# Patient Record
Sex: Male | Born: 1950 | Race: White | Hispanic: No | Marital: Married | Smoking: Former smoker
Health system: Southern US, Community
[De-identification: ages and names within clinical notes are randomized; demographics above are authoritative.]

## PROBLEM LIST (undated history)

## (undated) DIAGNOSIS — F419 Anxiety disorder, unspecified: Secondary | ICD-10-CM

## (undated) DIAGNOSIS — M1990 Unspecified osteoarthritis, unspecified site: Secondary | ICD-10-CM

## (undated) DIAGNOSIS — I82409 Acute embolism and thrombosis of unspecified deep veins of unspecified lower extremity: Secondary | ICD-10-CM

## (undated) DIAGNOSIS — E669 Obesity, unspecified: Secondary | ICD-10-CM

## (undated) DIAGNOSIS — E039 Hypothyroidism, unspecified: Secondary | ICD-10-CM

## (undated) DIAGNOSIS — Z7901 Long term (current) use of anticoagulants: Secondary | ICD-10-CM

## (undated) DIAGNOSIS — N529 Male erectile dysfunction, unspecified: Secondary | ICD-10-CM

## (undated) HISTORY — DX: Unspecified osteoarthritis, unspecified site: M19.90

## (undated) HISTORY — DX: Obesity, unspecified: E66.9

## (undated) HISTORY — PX: HERNIA REPAIR: SHX51

## (undated) HISTORY — PX: OTHER SURGICAL HISTORY: SHX169

## (undated) HISTORY — DX: Anxiety disorder, unspecified: F41.9

## (undated) HISTORY — DX: Hypothyroidism, unspecified: E03.9

## (undated) HISTORY — DX: Male erectile dysfunction, unspecified: N52.9

## (undated) HISTORY — DX: Acute embolism and thrombosis of unspecified deep veins of unspecified lower extremity: I82.409

## (undated) HISTORY — DX: Long term (current) use of anticoagulants: Z79.01

---

## 2001-01-21 ENCOUNTER — Encounter: Admission: RE | Admit: 2001-01-21 | Discharge: 2001-01-21 | Payer: Self-pay | Admitting: General Surgery

## 2001-01-21 ENCOUNTER — Encounter: Payer: Self-pay | Admitting: General Surgery

## 2003-03-29 LAB — HM COLONOSCOPY

## 2004-03-26 ENCOUNTER — Ambulatory Visit: Payer: Self-pay | Admitting: Internal Medicine

## 2004-05-14 ENCOUNTER — Ambulatory Visit: Payer: Self-pay | Admitting: Internal Medicine

## 2004-05-15 ENCOUNTER — Ambulatory Visit (HOSPITAL_COMMUNITY): Admission: RE | Admit: 2004-05-15 | Discharge: 2004-05-17 | Payer: Self-pay | Admitting: Interventional Radiology

## 2004-05-19 ENCOUNTER — Ambulatory Visit: Payer: Self-pay | Admitting: Internal Medicine

## 2004-05-22 ENCOUNTER — Ambulatory Visit: Payer: Self-pay | Admitting: Internal Medicine

## 2004-05-27 ENCOUNTER — Ambulatory Visit: Payer: Self-pay | Admitting: Internal Medicine

## 2004-06-02 ENCOUNTER — Ambulatory Visit: Payer: Self-pay | Admitting: Internal Medicine

## 2004-07-01 ENCOUNTER — Ambulatory Visit: Payer: Self-pay | Admitting: Internal Medicine

## 2004-07-15 ENCOUNTER — Ambulatory Visit: Payer: Self-pay | Admitting: Internal Medicine

## 2004-08-26 ENCOUNTER — Ambulatory Visit: Payer: Self-pay | Admitting: Internal Medicine

## 2004-10-27 ENCOUNTER — Ambulatory Visit: Payer: Self-pay | Admitting: Internal Medicine

## 2004-10-28 ENCOUNTER — Ambulatory Visit: Payer: Self-pay | Admitting: Internal Medicine

## 2005-01-01 ENCOUNTER — Ambulatory Visit: Payer: Self-pay | Admitting: Internal Medicine

## 2005-02-25 ENCOUNTER — Ambulatory Visit: Payer: Self-pay | Admitting: Internal Medicine

## 2005-03-19 ENCOUNTER — Ambulatory Visit: Payer: Self-pay | Admitting: Internal Medicine

## 2005-05-06 ENCOUNTER — Ambulatory Visit: Payer: Self-pay | Admitting: Internal Medicine

## 2005-06-19 ENCOUNTER — Ambulatory Visit: Payer: Self-pay | Admitting: Internal Medicine

## 2005-08-27 ENCOUNTER — Ambulatory Visit: Payer: Self-pay | Admitting: Internal Medicine

## 2005-09-23 ENCOUNTER — Ambulatory Visit: Payer: Self-pay | Admitting: Internal Medicine

## 2005-12-23 ENCOUNTER — Ambulatory Visit: Payer: Self-pay | Admitting: Internal Medicine

## 2005-12-23 LAB — CONVERTED CEMR LAB
INR: 2 (ref 0.9–2.0)
Prothrombin Time: 18.1 s — ABNORMAL HIGH (ref 10.0–14.0)

## 2006-02-02 ENCOUNTER — Ambulatory Visit: Payer: Self-pay | Admitting: Internal Medicine

## 2006-02-02 LAB — CONVERTED CEMR LAB
INR: 1.7 (ref 0.9–2.0)
Prothrombin Time: 16.3 s — ABNORMAL HIGH (ref 10.0–14.0)

## 2006-03-26 ENCOUNTER — Ambulatory Visit: Payer: Self-pay | Admitting: Internal Medicine

## 2006-03-26 LAB — CONVERTED CEMR LAB
ALT: 25 units/L (ref 0–40)
AST: 18 units/L (ref 0–37)
Albumin: 3.8 g/dL (ref 3.5–5.2)
Alkaline Phosphatase: 60 units/L (ref 39–117)
BUN: 16 mg/dL (ref 6–23)
CO2: 26 meq/L (ref 19–32)
Calcium: 8.9 mg/dL (ref 8.4–10.5)
Chloride: 105 meq/L (ref 96–112)
Creatinine, Ser: 1 mg/dL (ref 0.4–1.5)
GFR calc Af Amer: 100 mL/min
GFR calc non Af Amer: 82 mL/min
Glucose, Bld: 96 mg/dL (ref 70–99)
Hgb A1c MFr Bld: 5.9 % (ref 4.6–6.0)
INR: 2.1 — ABNORMAL HIGH (ref 0.9–2.0)
PSA: 0.54 ng/mL (ref 0.10–4.00)
Potassium: 4.1 meq/L (ref 3.5–5.1)
Prothrombin Time: 18.4 s — ABNORMAL HIGH (ref 10.0–14.0)
Sodium: 137 meq/L (ref 135–145)
TSH: 1.13 microintl units/mL (ref 0.35–5.50)
Total Bilirubin: 0.6 mg/dL (ref 0.3–1.2)
Total Protein: 6.6 g/dL (ref 6.0–8.3)

## 2006-07-05 ENCOUNTER — Ambulatory Visit: Payer: Self-pay | Admitting: Internal Medicine

## 2006-07-05 LAB — CONVERTED CEMR LAB
INR: 1.7
Prothrombin Time: 16.3 s — ABNORMAL HIGH

## 2006-08-30 ENCOUNTER — Encounter: Payer: Self-pay | Admitting: Internal Medicine

## 2006-08-30 DIAGNOSIS — Z86718 Personal history of other venous thrombosis and embolism: Secondary | ICD-10-CM | POA: Insufficient documentation

## 2006-08-30 DIAGNOSIS — F528 Other sexual dysfunction not due to a substance or known physiological condition: Secondary | ICD-10-CM | POA: Insufficient documentation

## 2006-09-15 ENCOUNTER — Ambulatory Visit: Payer: Self-pay | Admitting: Internal Medicine

## 2006-09-15 LAB — CONVERTED CEMR LAB
INR: 2.1 — ABNORMAL HIGH (ref 0.8–1.0)
Prothrombin Time: 18 s — ABNORMAL HIGH (ref 10.9–13.3)

## 2006-10-12 ENCOUNTER — Ambulatory Visit: Payer: Self-pay | Admitting: Internal Medicine

## 2006-10-12 LAB — CONVERTED CEMR LAB
ALT: 25 units/L (ref 0–53)
AST: 23 units/L (ref 0–37)
Albumin: 4.2 g/dL (ref 3.5–5.2)
Alkaline Phosphatase: 61 units/L (ref 39–117)
BUN: 18 mg/dL (ref 6–23)
Bilirubin, Direct: 0.2 mg/dL (ref 0.0–0.3)
CO2: 26 meq/L (ref 19–32)
Calcium: 9.3 mg/dL (ref 8.4–10.5)
Chloride: 107 meq/L (ref 96–112)
Creatinine, Ser: 1.1 mg/dL (ref 0.4–1.5)
GFR calc Af Amer: 89 mL/min
GFR calc non Af Amer: 74 mL/min
Glucose, Bld: 101 mg/dL — ABNORMAL HIGH (ref 70–99)
Hgb A1c MFr Bld: 6 % (ref 4.6–6.0)
INR: 2.5 — ABNORMAL HIGH (ref 0.8–1.0)
Potassium: 4.2 meq/L (ref 3.5–5.1)
Prothrombin Time: 19.8 s — ABNORMAL HIGH (ref 10.9–13.3)
Sodium: 140 meq/L (ref 135–145)
Total Bilirubin: 0.6 mg/dL (ref 0.3–1.2)
Total Protein: 7.1 g/dL (ref 6.0–8.3)

## 2006-11-29 ENCOUNTER — Telehealth (INDEPENDENT_AMBULATORY_CARE_PROVIDER_SITE_OTHER): Payer: Self-pay | Admitting: *Deleted

## 2006-12-02 ENCOUNTER — Ambulatory Visit: Payer: Self-pay | Admitting: Internal Medicine

## 2006-12-02 DIAGNOSIS — M1990 Unspecified osteoarthritis, unspecified site: Secondary | ICD-10-CM | POA: Insufficient documentation

## 2006-12-04 LAB — CONVERTED CEMR LAB
INR: 1.1 — ABNORMAL HIGH (ref 0.8–1.0)
Prothrombin Time: 12.8 s (ref 10.9–13.3)

## 2006-12-21 ENCOUNTER — Telehealth: Payer: Self-pay | Admitting: Internal Medicine

## 2006-12-21 ENCOUNTER — Ambulatory Visit: Payer: Self-pay | Admitting: Internal Medicine

## 2006-12-21 LAB — CONVERTED CEMR LAB
INR: 2.3 — ABNORMAL HIGH
Prothrombin Time: 19.2 s — ABNORMAL HIGH

## 2007-01-03 ENCOUNTER — Ambulatory Visit: Payer: Self-pay | Admitting: Internal Medicine

## 2007-01-03 DIAGNOSIS — E669 Obesity, unspecified: Secondary | ICD-10-CM | POA: Insufficient documentation

## 2007-01-03 DIAGNOSIS — R030 Elevated blood-pressure reading, without diagnosis of hypertension: Secondary | ICD-10-CM | POA: Insufficient documentation

## 2007-03-14 ENCOUNTER — Ambulatory Visit: Payer: Self-pay | Admitting: Internal Medicine

## 2007-03-16 LAB — CONVERTED CEMR LAB
BUN: 17 mg/dL (ref 6–23)
Basophils Absolute: 0.2 10*3/uL — ABNORMAL HIGH (ref 0.0–0.1)
Basophils Relative: 2.8 % — ABNORMAL HIGH (ref 0.0–1.0)
Bilirubin Urine: NEGATIVE
CO2: 27 meq/L (ref 19–32)
Calcium: 9.1 mg/dL (ref 8.4–10.5)
Chloride: 104 meq/L (ref 96–112)
Cholesterol: 142 mg/dL (ref 0–200)
Creatinine, Ser: 1 mg/dL (ref 0.4–1.5)
Eosinophils Absolute: 0.2 10*3/uL (ref 0.0–0.6)
Eosinophils Relative: 2.1 % (ref 0.0–5.0)
GFR calc Af Amer: 99 mL/min
GFR calc non Af Amer: 82 mL/min
Glucose, Bld: 107 mg/dL — ABNORMAL HIGH (ref 70–99)
HCT: 44 % (ref 39.0–52.0)
HDL: 36.7 mg/dL — ABNORMAL LOW (ref >39.0)
Hemoglobin, Urine: NEGATIVE
Hemoglobin: 15.3 g/dL (ref 13.0–17.0)
Hgb A1c MFr Bld: 5.9 % (ref 4.6–6.0)
Ketones, ur: NEGATIVE mg/dL
LDL Cholesterol: 82 mg/dL (ref 0–99)
Leukocytes, UA: NEGATIVE
Lymphocytes Relative: 25.4 % (ref 12.0–46.0)
MCHC: 34.9 g/dL (ref 30.0–36.0)
MCV: 96.8 fL (ref 78.0–100.0)
Monocytes Absolute: 0.6 10*3/uL (ref 0.2–0.7)
Monocytes Relative: 8 % (ref 3.0–11.0)
Neutro Abs: 4.7 10*3/uL (ref 1.4–7.7)
Neutrophils Relative %: 61.7 % (ref 43.0–77.0)
Nitrite: NEGATIVE
PSA: 0.57 ng/mL (ref 0.10–4.00)
Platelets: 324 10*3/uL (ref 150–400)
Potassium: 4.1 meq/L (ref 3.5–5.1)
RBC: 4.54 M/uL (ref 4.22–5.81)
RDW: 12.1 % (ref 11.5–14.6)
Sodium: 137 meq/L (ref 135–145)
Specific Gravity, Urine: >=1.03 (ref 1.000–1.03)
TSH: 1.78 microintl units/mL (ref 0.35–5.50)
Total CHOL/HDL Ratio: 3.9
Total Protein, Urine: NEGATIVE mg/dL
Triglycerides: 117 mg/dL (ref 0–149)
Urine Glucose: NEGATIVE mg/dL
Urobilinogen, UA: 0.2 (ref 0.0–1.0)
VLDL: 23 mg/dL (ref 0–40)
WBC: 7.7 10*3/uL (ref 4.5–10.5)
pH: 5 (ref 5.0–8.0)

## 2007-07-08 ENCOUNTER — Ambulatory Visit: Payer: Self-pay | Admitting: Internal Medicine

## 2007-07-09 LAB — CONVERTED CEMR LAB
BUN: 19 mg/dL (ref 6–23)
Basophils Absolute: 0 10*3/uL (ref 0.0–0.1)
Basophils Relative: 0.1 % (ref 0.0–1.0)
Bilirubin Urine: NEGATIVE
CO2: 26 meq/L (ref 19–32)
Calcium: 9 mg/dL (ref 8.4–10.5)
Chloride: 107 meq/L (ref 96–112)
Cholesterol: 152 mg/dL (ref 0–200)
Creatinine, Ser: 1 mg/dL (ref 0.4–1.5)
Eosinophils Absolute: 0.2 10*3/uL (ref 0.0–0.7)
Eosinophils Relative: 2.2 % (ref 0.0–5.0)
GFR calc Af Amer: 99 mL/min
GFR calc non Af Amer: 82 mL/min
Glucose, Bld: 111 mg/dL — ABNORMAL HIGH (ref 70–99)
HCT: 43.2 % (ref 39.0–52.0)
HDL: 32 mg/dL — ABNORMAL LOW (ref >39.0)
Hemoglobin, Urine: NEGATIVE
Hemoglobin: 15.3 g/dL (ref 13.0–17.0)
Hgb A1c MFr Bld: 6 % (ref 4.6–6.0)
Ketones, ur: NEGATIVE mg/dL
LDL Cholesterol: 98 mg/dL (ref 0–99)
Leukocytes, UA: NEGATIVE
Lymphocytes Relative: 26.4 % (ref 12.0–46.0)
MCHC: 35.3 g/dL (ref 30.0–36.0)
MCV: 96.7 fL (ref 78.0–100.0)
Monocytes Absolute: 0.6 10*3/uL (ref 0.1–1.0)
Monocytes Relative: 8.3 % (ref 3.0–12.0)
Neutro Abs: 4.5 10*3/uL (ref 1.4–7.7)
Neutrophils Relative %: 63 % (ref 43.0–77.0)
Nitrite: NEGATIVE
PSA: 0.44 ng/mL (ref 0.10–4.00)
Platelets: 306 10*3/uL (ref 150–400)
Potassium: 4.4 meq/L (ref 3.5–5.1)
RBC: 4.47 M/uL (ref 4.22–5.81)
RDW: 12.2 % (ref 11.5–14.6)
Sodium: 138 meq/L (ref 135–145)
Specific Gravity, Urine: 1.02 (ref 1.000–1.03)
TSH: 1.33 microintl units/mL (ref 0.35–5.50)
Total CHOL/HDL Ratio: 4.8
Total Protein, Urine: NEGATIVE mg/dL
Triglycerides: 108 mg/dL (ref 0–149)
Urine Glucose: NEGATIVE mg/dL
Urobilinogen, UA: 1 (ref 0.0–1.0)
VLDL: 22 mg/dL (ref 0–40)
WBC: 7.2 10*3/uL (ref 4.5–10.5)
pH: 6 (ref 5.0–8.0)

## 2007-07-12 ENCOUNTER — Ambulatory Visit: Payer: Self-pay | Admitting: Internal Medicine

## 2007-07-12 DIAGNOSIS — H1130 Conjunctival hemorrhage, unspecified eye: Secondary | ICD-10-CM | POA: Insufficient documentation

## 2007-10-12 ENCOUNTER — Ambulatory Visit: Payer: Self-pay | Admitting: Internal Medicine

## 2007-10-12 DIAGNOSIS — R609 Edema, unspecified: Secondary | ICD-10-CM | POA: Insufficient documentation

## 2007-10-14 LAB — CONVERTED CEMR LAB
BUN: 15 mg/dL (ref 6–23)
CO2: 27 meq/L (ref 19–32)
Calcium: 8.9 mg/dL (ref 8.4–10.5)
Chloride: 108 meq/L (ref 96–112)
Creatinine, Ser: 1 mg/dL (ref 0.4–1.5)
GFR calc Af Amer: 99 mL/min
GFR calc non Af Amer: 82 mL/min
Glucose, Bld: 99 mg/dL (ref 70–99)
Hgb A1c MFr Bld: 5.9 % (ref 4.6–6.0)
INR: 2.8 — ABNORMAL HIGH (ref 0.8–1.0)
Potassium: 4.3 meq/L (ref 3.5–5.1)
Prothrombin Time: 29.3 s — ABNORMAL HIGH (ref 10.9–13.3)
Sodium: 139 meq/L (ref 135–145)

## 2008-01-24 ENCOUNTER — Ambulatory Visit: Payer: Self-pay | Admitting: Internal Medicine

## 2008-01-26 LAB — CONVERTED CEMR LAB
INR: 3 — ABNORMAL HIGH (ref 0.8–1.0)
Prothrombin Time: 30.9 s — ABNORMAL HIGH (ref 10.9–13.3)

## 2008-01-30 ENCOUNTER — Telehealth: Payer: Self-pay | Admitting: Internal Medicine

## 2008-03-14 ENCOUNTER — Encounter (INDEPENDENT_AMBULATORY_CARE_PROVIDER_SITE_OTHER): Payer: Self-pay | Admitting: *Deleted

## 2008-05-01 ENCOUNTER — Ambulatory Visit: Payer: Self-pay | Admitting: Internal Medicine

## 2008-05-01 DIAGNOSIS — R350 Frequency of micturition: Secondary | ICD-10-CM | POA: Insufficient documentation

## 2008-05-01 DIAGNOSIS — I872 Venous insufficiency (chronic) (peripheral): Secondary | ICD-10-CM | POA: Insufficient documentation

## 2008-05-02 LAB — CONVERTED CEMR LAB
BUN: 18 mg/dL (ref 6–23)
CO2: 29 meq/L (ref 19–32)
Calcium: 9 mg/dL (ref 8.4–10.5)
Chloride: 109 meq/L (ref 96–112)
Creatinine, Ser: 1.1 mg/dL (ref 0.4–1.5)
GFR calc non Af Amer: 73.14 mL/min (ref >60)
Glucose, Bld: 99 mg/dL (ref 70–99)
Hgb A1c MFr Bld: 6 % (ref 4.6–6.5)
INR: 2.1 — ABNORMAL HIGH (ref 0.8–1.0)
PSA: 0.56 ng/mL (ref 0.10–4.00)
Potassium: 4.2 meq/L (ref 3.5–5.1)
Prothrombin Time: 21.5 s — ABNORMAL HIGH (ref 10.9–13.3)
Sodium: 142 meq/L (ref 135–145)
TSH: 1.13 microintl units/mL (ref 0.35–5.50)

## 2008-07-31 ENCOUNTER — Ambulatory Visit: Payer: Self-pay | Admitting: Internal Medicine

## 2008-08-01 LAB — CONVERTED CEMR LAB
ALT: 28 units/L (ref 0–53)
AST: 22 units/L (ref 0–37)
Albumin: 3.9 g/dL (ref 3.5–5.2)
Alkaline Phosphatase: 65 units/L (ref 39–117)
BUN: 14 mg/dL (ref 6–23)
Bilirubin, Direct: 0.2 mg/dL (ref 0.0–0.3)
CO2: 25 meq/L (ref 19–32)
Calcium: 8.9 mg/dL (ref 8.4–10.5)
Chloride: 106 meq/L (ref 96–112)
Creatinine, Ser: 1 mg/dL (ref 0.4–1.5)
GFR calc non Af Amer: 81.58 mL/min (ref >60)
Glucose, Bld: 104 mg/dL — ABNORMAL HIGH (ref 70–99)
Potassium: 3.9 meq/L (ref 3.5–5.1)
Sodium: 138 meq/L (ref 135–145)
TSH: 1.46 microintl units/mL (ref 0.35–5.50)
Total Bilirubin: 0.8 mg/dL (ref 0.3–1.2)
Total Protein: 6.7 g/dL (ref 6.0–8.3)

## 2008-10-30 ENCOUNTER — Ambulatory Visit: Payer: Self-pay | Admitting: Internal Medicine

## 2008-10-30 DIAGNOSIS — Z87891 Personal history of nicotine dependence: Secondary | ICD-10-CM | POA: Insufficient documentation

## 2008-10-30 LAB — CONVERTED CEMR LAB
BUN: 17 mg/dL (ref 6–23)
CO2: 25 meq/L (ref 19–32)
Calcium: 8.8 mg/dL (ref 8.4–10.5)
Chloride: 104 meq/L (ref 96–112)
Creatinine, Ser: 1 mg/dL (ref 0.4–1.5)
GFR calc non Af Amer: 81.5 mL/min (ref >60)
Glucose, Bld: 92 mg/dL (ref 70–99)
Potassium: 3.9 meq/L (ref 3.5–5.1)
Sodium: 139 meq/L (ref 135–145)

## 2009-01-19 DIAGNOSIS — E039 Hypothyroidism, unspecified: Secondary | ICD-10-CM

## 2009-01-19 HISTORY — DX: Hypothyroidism, unspecified: E03.9

## 2009-02-04 ENCOUNTER — Ambulatory Visit: Payer: Self-pay | Admitting: Internal Medicine

## 2009-02-04 DIAGNOSIS — R142 Eructation: Secondary | ICD-10-CM

## 2009-02-04 DIAGNOSIS — R143 Flatulence: Secondary | ICD-10-CM

## 2009-02-04 DIAGNOSIS — R141 Gas pain: Secondary | ICD-10-CM | POA: Insufficient documentation

## 2009-02-05 LAB — CONVERTED CEMR LAB
BUN: 15 mg/dL (ref 6–23)
Basophils Absolute: 0 10*3/uL (ref 0.0–0.1)
Basophils Relative: 0.5 % (ref 0.0–3.0)
CO2: 23 meq/L (ref 19–32)
Calcium: 8.8 mg/dL (ref 8.4–10.5)
Chloride: 108 meq/L (ref 96–112)
Creatinine, Ser: 0.9 mg/dL (ref 0.4–1.5)
Eosinophils Absolute: 0.1 10*3/uL (ref 0.0–0.7)
Eosinophils Relative: 2 % (ref 0.0–5.0)
GFR calc non Af Amer: 91.96 mL/min (ref >60)
Glucose, Bld: 93 mg/dL (ref 70–99)
HCT: 43.3 % (ref 39.0–52.0)
Hemoglobin: 14.7 g/dL (ref 13.0–17.0)
INR: 1.7 — ABNORMAL HIGH (ref 0.8–1.0)
Lymphocytes Relative: 30.9 % (ref 12.0–46.0)
Lymphs Abs: 2.1 10*3/uL (ref 0.7–4.0)
MCHC: 33.9 g/dL (ref 30.0–36.0)
MCV: 99.6 fL (ref 78.0–100.0)
Monocytes Absolute: 0.6 10*3/uL (ref 0.1–1.0)
Monocytes Relative: 8.7 % (ref 3.0–12.0)
Neutro Abs: 4.1 10*3/uL (ref 1.4–7.7)
Neutrophils Relative %: 57.9 % (ref 43.0–77.0)
Platelets: 282 10*3/uL (ref 150.0–400.0)
Potassium: 4.1 meq/L (ref 3.5–5.1)
Prothrombin Time: 17.2 s — ABNORMAL HIGH (ref 9.1–11.7)
RBC: 4.35 M/uL (ref 4.22–5.81)
RDW: 12.2 % (ref 11.5–14.6)
Sodium: 139 meq/L (ref 135–145)
WBC: 6.9 10*3/uL (ref 4.5–10.5)

## 2009-05-07 ENCOUNTER — Ambulatory Visit: Payer: Self-pay | Admitting: Internal Medicine

## 2009-05-07 LAB — CONVERTED CEMR LAB
BUN: 15 mg/dL (ref 6–23)
CO2: 27 meq/L (ref 19–32)
Calcium: 8.8 mg/dL (ref 8.4–10.5)
Chloride: 105 meq/L (ref 96–112)
Creatinine, Ser: 1.1 mg/dL (ref 0.4–1.5)
GFR calc non Af Amer: 72.88 mL/min (ref >60)
Glucose, Bld: 95 mg/dL (ref 70–99)
INR: 1.7 — ABNORMAL HIGH (ref 0.8–1.0)
Potassium: 4.3 meq/L (ref 3.5–5.1)
Prothrombin Time: 17.4 s — ABNORMAL HIGH (ref 9.1–11.7)
Sodium: 138 meq/L (ref 135–145)

## 2009-08-02 ENCOUNTER — Telehealth: Payer: Self-pay | Admitting: Gastroenterology

## 2009-08-07 ENCOUNTER — Ambulatory Visit: Payer: Self-pay | Admitting: Internal Medicine

## 2009-08-07 DIAGNOSIS — M25569 Pain in unspecified knee: Secondary | ICD-10-CM | POA: Insufficient documentation

## 2009-08-08 LAB — CONVERTED CEMR LAB
INR: 1.9 — ABNORMAL HIGH (ref 0.8–1.0)
Prothrombin Time: 20.3 s — ABNORMAL HIGH (ref 9.7–11.8)

## 2009-11-12 ENCOUNTER — Ambulatory Visit: Payer: Self-pay | Admitting: Internal Medicine

## 2009-11-12 ENCOUNTER — Encounter: Payer: Self-pay | Admitting: Internal Medicine

## 2009-11-12 DIAGNOSIS — E039 Hypothyroidism, unspecified: Secondary | ICD-10-CM | POA: Insufficient documentation

## 2009-11-13 LAB — CONVERTED CEMR LAB
ALT: 18 units/L (ref 0–53)
AST: 19 units/L (ref 0–37)
Albumin: 4 g/dL (ref 3.5–5.2)
Alkaline Phosphatase: 63 units/L (ref 39–117)
BUN: 18 mg/dL (ref 6–23)
Basophils Absolute: 0 10*3/uL (ref 0.0–0.1)
Basophils Relative: 0.4 % (ref 0.0–3.0)
Bilirubin Urine: NEGATIVE
Bilirubin, Direct: 0.2 mg/dL (ref 0.0–0.3)
CO2: 25 meq/L (ref 19–32)
Calcium: 9 mg/dL (ref 8.4–10.5)
Chloride: 107 meq/L (ref 96–112)
Cholesterol: 140 mg/dL (ref 0–200)
Creatinine, Ser: 1 mg/dL (ref 0.4–1.5)
Eosinophils Absolute: 0.2 10*3/uL (ref 0.0–0.7)
Eosinophils Relative: 1.6 % (ref 0.0–5.0)
GFR calc non Af Amer: 83.13 mL/min (ref >60)
Glucose, Bld: 96 mg/dL (ref 70–99)
HCT: 42.8 % (ref 39.0–52.0)
HDL: 41.1 mg/dL (ref >39.00)
Hemoglobin, Urine: NEGATIVE
Hemoglobin: 14.8 g/dL (ref 13.0–17.0)
Ketones, ur: NEGATIVE mg/dL
LDL Cholesterol: 79 mg/dL (ref 0–99)
Leukocytes, UA: NEGATIVE
Lymphocytes Relative: 19.2 % (ref 12.0–46.0)
Lymphs Abs: 2 10*3/uL (ref 0.7–4.0)
MCHC: 34.6 g/dL (ref 30.0–36.0)
MCV: 98.5 fL (ref 78.0–100.0)
Monocytes Absolute: 0.6 10*3/uL (ref 0.1–1.0)
Monocytes Relative: 5.8 % (ref 3.0–12.0)
Neutro Abs: 7.5 10*3/uL (ref 1.4–7.7)
Neutrophils Relative %: 73 % (ref 43.0–77.0)
Nitrite: NEGATIVE
PSA: 1.78 ng/mL (ref 0.10–4.00)
Platelets: 339 10*3/uL (ref 150.0–400.0)
Potassium: 4.2 meq/L (ref 3.5–5.1)
RBC: 4.34 M/uL (ref 4.22–5.81)
RDW: 12.9 % (ref 11.5–14.6)
Sodium: 139 meq/L (ref 135–145)
Specific Gravity, Urine: >=1.03 (ref 1.000–1.030)
TSH: 9.13 microintl units/mL — ABNORMAL HIGH (ref 0.35–5.50)
Total Bilirubin: 0.7 mg/dL (ref 0.3–1.2)
Total CHOL/HDL Ratio: 3
Total Protein, Urine: NEGATIVE mg/dL
Total Protein: 6.6 g/dL (ref 6.0–8.3)
Triglycerides: 100 mg/dL (ref 0.0–149.0)
Urine Glucose: NEGATIVE mg/dL
Urobilinogen, UA: 0.2 (ref 0.0–1.0)
VLDL: 20 mg/dL (ref 0.0–40.0)
WBC: 10.3 10*3/uL (ref 4.5–10.5)
pH: 5.5 (ref 5.0–8.0)

## 2009-11-14 ENCOUNTER — Ambulatory Visit: Payer: Self-pay | Admitting: Internal Medicine

## 2009-11-15 LAB — CONVERTED CEMR LAB
INR: 1.6 — ABNORMAL HIGH (ref 0.8–1.0)
Prothrombin Time: 17.5 s — ABNORMAL HIGH (ref 9.7–11.8)

## 2010-02-14 ENCOUNTER — Other Ambulatory Visit: Payer: Self-pay | Admitting: Internal Medicine

## 2010-02-14 ENCOUNTER — Ambulatory Visit
Admission: RE | Admit: 2010-02-14 | Discharge: 2010-02-14 | Payer: Self-pay | Source: Home / Self Care | Attending: Internal Medicine | Admitting: Internal Medicine

## 2010-02-14 ENCOUNTER — Ambulatory Visit: Admit: 2010-02-14 | Payer: Self-pay | Admitting: Internal Medicine

## 2010-02-14 LAB — PROTIME-INR
INR: 2.3 ratio — ABNORMAL HIGH (ref 0.8–1.0)
Prothrombin Time: 23.5 s — ABNORMAL HIGH (ref 10.2–12.4)

## 2010-02-18 ENCOUNTER — Ambulatory Visit
Admission: RE | Admit: 2010-02-18 | Discharge: 2010-02-18 | Payer: Self-pay | Source: Home / Self Care | Attending: Internal Medicine | Admitting: Internal Medicine

## 2010-02-18 DIAGNOSIS — H6690 Otitis media, unspecified, unspecified ear: Secondary | ICD-10-CM | POA: Insufficient documentation

## 2010-02-18 DIAGNOSIS — F419 Anxiety disorder, unspecified: Secondary | ICD-10-CM | POA: Insufficient documentation

## 2010-02-18 DIAGNOSIS — F411 Generalized anxiety disorder: Secondary | ICD-10-CM | POA: Insufficient documentation

## 2010-02-18 NOTE — Letter (Signed)
Summary: *Consult Note  [facility]  [address] [place], [place] [address]   Phone: [phone]  Fax: [phone]    Re:    Joseph [name] DOB:    07/07/1950   Dear: Dr Darl Pikes   Thank you for requesting that we see the above patient for consultation.  A copy of the detailed office note will be sent under separate cover, for your review.  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 If the INR is less than 2.5, he should be OK to have oral surgery without stopping his Coumadin (if it is OK with you)    Thank you for this consultation.  If you have any further questions regarding the care of this patient, please do not hesitate to contact me @   Thank you for this opportunity to look after your patient.  Sincerely,   Tresa Garter MD

## 2010-02-18 NOTE — Assessment & Plan Note (Signed)
Summary: 3 mos well /bcbs / #/cd   Vital Signs:  Patient profile:   60 year old male Height:      75 inches Weight:      284 pounds BMI:     35.63 Temp:     98.8 degrees F oral Pulse rate:   72 / minute Pulse rhythm:   regular Resp:     16 per minute BP sitting:   112 / 90  (left arm) Cuff size:   large  Vitals Entered By: Lanier Prude, Beverly Gust) (November 12, 2009 3:47 PM) CC: CPX   CC:  CPX.  History of Present Illness: The patient presents for a preventive health examination   Current Medications (verified): 1)  Coumadin 10 Mg  Tabs (Warfarin Sodium) .... One Once Daily Exept  1.5 Tab  On Saturday and Sunday or As Dirrected 2)  Xanax 1 Mg  Tabs (Alprazolam) .... 1/2 -1 Two Times A Day As Needed 3)  Viagra 100 Mg Tabs (Sildenafil Citrate) .Marland Kitchen.. 1 By Mouth Once Daily Prn 4)  Vitamin D3 1000 Unit  Tabs (Cholecalciferol) .Marland Kitchen.. 1 Qd 5)  Cialis 20 Mg Tabs (Tadalafil) .Marland Kitchen.. 1 Tablet Every Other Day As Needed 6)  Pennsaid 1.5 % Soln (Diclofenac Sodium) .... 3-5 Gtt On Skin Three Times A Day For Pain  Allergies (verified): 1)  Erythromycin  Past History:  Past Surgical History: Last updated: 08/30/2006 Herniorrhaphy  Family History: Last updated: 12/02/2006 Family History of CAD Male 1st degree relative <50 died at 54 M 25 alive  Social History: Last updated: 10/30/2008 Occupation: Clinical research associate Married Former Smoker Alcohol use-no Regular exercise-started to walk, doing wts, gym  Past Medical History: DVT R leg, hx of Anticoagulation therapy Osteoarthritis ED Postphlebitic swelling R leg Elev. BP 2008 Obesity Hypothyroidism 2011  Review of Systems  The patient denies anorexia, fever, weight loss, weight gain, vision loss, decreased hearing, hoarseness, chest pain, syncope, dyspnea on exertion, peripheral edema, prolonged cough, headaches, hemoptysis, abdominal pain, melena, hematochezia, severe indigestion/heartburn, hematuria, incontinence, genital sores, muscle  weakness, suspicious skin lesions, transient blindness, difficulty walking, depression, unusual weight change, abnormal bleeding, enlarged lymph nodes, angioedema, and testicular masses.         on diet, exercising, lost 8 lbs  Physical Exam  General:  overweight-appearing.   Head:  Normocephalic and atraumatic without obvious abnormalities. No apparent alopecia or balding. Eyes:  2 mm R upper and 1 mm lower eyelid cysts Ears:  External ear exam shows no significant lesions or deformities.  Otoscopic examination reveals clear canals, tympanic membranes are intact bilaterally without bulging, retraction, inflammation or discharge. Hearing is grossly normal bilaterally. Nose:  External nasal examination shows no deformity or inflammation. Nasal mucosa are pink and moist without lesions or exudates. Mouth:  Oral mucosa and oropharynx without lesions or exudates.  Teeth in good repair. Neck:  No deformities, masses, or tenderness noted. Chest Wall:  No deformities, masses, tenderness or gynecomastia noted. Lungs:  Normal respiratory effort, chest expands symmetrically. Lungs are clear to auscultation, no crackles or wheezes. Heart:  Normal rate and regular rhythm. S1 and S2 normal without gallop, murmur, click, rub or other extra sounds. Abdomen:  Bowel sounds positive,abdomen soft and non-tender without masses, organomegaly or hernias noted. Rectal:  NE - colonoscopy is pending  Genitalia:  as above Prostate:  as above Msk:  No deformity or scoliosis noted of thoracic or lumbar spine.   Pulses:  R and L carotid,radial,femoral,dorsalis pedis and posterior tibial pulses are full and  equal bilaterally Extremities:  trace left pedal edema and 1+ right pedal edema.   Neurologic:  alert & oriented X3.   Skin:  Intact without suspicious lesions or rashes Hyperpigmentation over RLE 4 mm mole in RUQ Psych:  Cognition and judgment appear intact. Alert and cooperative with normal attention span and  concentration. No apparent delusions, illusions, hallucinations   Impression & Recommendations:  Problem # 1:  HEALTH MAINTENANCE EXAM (ICD-V70.0) Assessment New The labs were reviewed - mailed to pt Health and age related issues were discussed. Available screening tests and vaccinations were discussed as well. Healthy life style including good diet and exercise was discussed. Cont w/wt loss Colon is pending   Problem # 2:  HYPOTHYROIDISM (ICD-244.9) Assessment: New  Start on Rx  His updated medication list for this problem includes:    Synthroid 25 Mcg Tabs (Levothyroxine sodium) .Marland Kitchen... 1 by mouth once daily for thyroid  Problem # 3:  EDEMA (ICD-782.3) Assessment: Unchanged  Problem # 4:  ANTICOAGULATION THERAPY (ICD-V58.61) Assessment: Unchanged  Complete Medication List: 1)  Coumadin 10 Mg Tabs (Warfarin sodium) .... One once daily exept  1.5 tab  on saturday and sunday or as dirrected 2)  Xanax 1 Mg Tabs (Alprazolam) .... 1/2 -1 two times a day as needed 3)  Viagra 100 Mg Tabs (Sildenafil citrate) .Marland Kitchen.. 1 by mouth once daily prn 4)  Vitamin D3 1000 Unit Tabs (Cholecalciferol) .Marland Kitchen.. 1 qd 5)  Cialis 20 Mg Tabs (Tadalafil) .Marland Kitchen.. 1 tablet every other day as needed 6)  Pennsaid 1.5 % Soln (Diclofenac sodium) .... 3-5 gtt on skin three times a day for pain 7)  Synthroid 25 Mcg Tabs (Levothyroxine sodium) .Marland Kitchen.. 1 by mouth once daily for thyroid  Other Orders: EKG w/ Interpretation (93000) Admin 1st Vaccine (16109) Flu Vaccine 87yrs + (60454)  Patient Instructions: 1)  Please schedule a follow-up appointment in 3 months. Prescriptions: SYNTHROID 25 MCG TABS (LEVOTHYROXINE SODIUM) 1 by mouth once daily for thyroid  #30 x 11   Entered and Authorized by:   Tresa Garter MD   Signed by:   Tresa Garter MD on 11/12/2009   Method used:   Print then Give to Patient   RxID:   [id number] COUMADIN 10 MG  TABS (WARFARIN SODIUM) one once daily exept  1.5 tab  on Saturday  and Sunday or as dirrected  #45 x 12   Entered and Authorized by:   Tresa Garter MD   Signed by:   Tresa Garter MD on 11/12/2009   Method used:   Print then Give to Patient   RxID:   [id number]    Orders Added: 1)  EKG w/ Interpretation [93000] 2)  Admin 1st Vaccine [90471] 3)  Flu Vaccine 58yrs + [41324] 4)  Est. Patient 40-64 years [40102]   Tetanus Vaccine (to be given today)   Tetanus Vaccine (to be given today)   .[email or web address]   Flu Vaccine Consent Questions     Do you have a history of severe allergic reactions to this vaccine? no    Any prior history of allergic reactions to egg and/or gelatin? no    Do you have a sensitivity to the preservative Thimersol? no    Do you have a past history of Guillan-Barre Syndrome? no    Do you currently have an acute febrile illness? no    Have you ever had a severe reaction to latex? no    Vaccine information given and explained to patient?  yes    Are you currently pregnant? no    Lot Number:[id number]   Exp Date:07/19/2010   Site Given  Right Deltoid IM Lanier Prude, [facility])  November 12, 2009 3:58 PM

## 2010-02-18 NOTE — Assessment & Plan Note (Signed)
Summary: 3 MO ROV /NWS   Vital Signs:  Patient profile:   60 year old male Weight:      285 pounds BMI:     35.75 Temp:     98.3 degrees F oral Pulse rate:   64 / minute BP sitting:   122 / 90  (left arm)  Vitals Entered By: Tora Perches (February 04, 2009 1:47 PM) CC: f/u Is Patient Diabetic? No   CC:  f/u.  History of Present Illness: The patient presents for a follow up of back pain, anxiety, ED, anticoagulation. C/o lesion on R eye x 3 months - NT  Preventive Screening-Counseling & Management  Alcohol-Tobacco     Smoking Status: quit  Allergies: 1)  Erythromycin  Past History:  Past Medical History: Last updated: 01/03/2007 DVT R leg, hx of Anticoagulation therapy Osteoarthritis ED Postphlebitic swelling R leg Elev. BP 2008 Obesity  Past Surgical History: Last updated: 08/30/2006 Herniorrhaphy  Social History: Last updated: 10/30/2008 Occupation: Clinical research associate Married Former Smoker Alcohol use-no Regular exercise-started to walk, doing wts, gym  Review of Systems  The patient denies fever, chest pain, dyspnea on exertion, and abdominal pain.    Physical Exam  General:  overweight-appearing.   Eyes:  2 mm R upper and 1 mm lower eyelid cysts Ears:  External ear exam shows no significant lesions or deformities.  Otoscopic examination reveals clear canals, tympanic membranes are intact bilaterally without bulging, retraction, inflammation or discharge. Hearing is grossly normal bilaterally. Nose:  External nasal examination shows no deformity or inflammation. Nasal mucosa are pink and moist without lesions or exudates. Mouth:  Oral mucosa and oropharynx without lesions or exudates.  Teeth in good repair. Lungs:  Normal respiratory effort, chest expands symmetrically. Lungs are clear to auscultation, no crackles or wheezes. Heart:  Normal rate and regular rhythm. S1 and S2 normal without gallop, murmur, click, rub or other extra sounds. Abdomen:  Bowel  sounds positive,abdomen soft and non-tender without masses, organomegaly or hernias noted. Msk:  No deformity or scoliosis noted of thoracic or lumbar spine.   Pulses:  R and L carotid,radial,femoral,dorsalis pedis and posterior tibial pulses are full and equal bilaterally Extremities:  trace left pedal edema and 1 to 2+ right pedal edema.   Neurologic:  alert & oriented X3.   Skin:  Intact without suspicious lesions or rashes Hyperpigmentation over RLE Psych:  Cognition and judgment appear intact. Alert and cooperative with normal attention span and concentration. No apparent delusions, illusions, hallucinations   Impression & Recommendations:  Problem # 1:  R eye upper eyelid 2 mm gland cyst Assessment New Ophth consult for removal - he will let me know Z pac if swells  Problem # 2:  VENOUS INSUFFICIENCY, CHRONIC, RIGHT LEG (ICD-459.81) Assessment: Unchanged  Orders: TLB-CBC Platelet - w/Differential (85025-CBCD) TLB-BMP (Basic Metabolic Panel-BMET) (80048-METABOL) TLB-PT (Protime) (85610-PTP)  Problem # 3:  FLATULENCE (ICD-787.3) Assessment: Deteriorated Align, diet. Other options discussed  Problem # 4:  EDEMA (ICD-782.3) R leg Assessment: Unchanged  Complete Medication List: 1)  Coumadin 10 Mg Tabs (Warfarin sodium) .... One once daily exept  1.5 tab  on saturday and sunday or as dirrected 2)  Xanax 1 Mg Tabs (Alprazolam) .... 1/2 -1 two times a day as needed 3)  Viagra 100 Mg Tabs (Sildenafil citrate) .Marland Kitchen.. 1 by mouth once daily prn 4)  Vitamin D3 1000 Unit Tabs (Cholecalciferol) .Marland Kitchen.. 1 qd 5)  Cialis 20 Mg Tabs (Tadalafil) .Marland Kitchen.. 1 tablet every other day as needed  Patient Instructions: 1)  Please schedule a follow-up appointment in 3 months. Prescriptions: XANAX 1 MG  TABS (ALPRAZOLAM) 1/2 -1 two times a day as needed  #60 x 6   Entered and Authorized by:   Tresa Garter MD   Signed by:   Tresa Garter MD on 02/04/2009   Method used:   Print then Give to  Patient   RxID:   [id number] VIAGRA 100 MG TABS (SILDENAFIL CITRATE) 1 by mouth once daily prn  #6 x 12   Entered and Authorized by:   Tresa Garter MD   Signed by:   Tresa Garter MD on 02/04/2009   Method used:   Print then Give to Patient   RxID:   [id number]

## 2010-02-18 NOTE — Assessment & Plan Note (Signed)
Summary: 3 mo rov/nws   Vital Signs:  Patient Profile:   60 Years Old Male Weight:      301 pounds Temp:     98.4 degrees F oral Pulse rate:   60 / minute BP sitting:   146 / 98  (left arm)  Vitals Entered By: Tora Perches (January 03, 2007 11:06 AM)             Is Patient Diabetic? No     Chief Complaint:  Multiple medical problems or concerns.  History of Present Illness: The patient presents for a follow up of anticoag., DVT, BPH, elev. BP   Current Allergies (reviewed today): ERYTHROMYCIN  Past Medical History:    DVT R leg, hx of    Anticoagulation therapy    Osteoarthritis    ED    Postphlebitic swelling R leg    Elev. BP 2008    Obesity   Family History:    Reviewed history from 12/02/2006 and no changes required:       Family History of CAD Male 1st degree relative <50 died at 21       M 75 alive  Social History:    Reviewed history from 12/02/2006 and no changes required:       Occupation:       Married       Former Smoker       Alcohol use-no       Regular exercise-no    Review of Systems  The patient denies fever, chest pain, and abdominal pain.     Physical Exam  General:     Well-developed,well-nourished,in no acute distress; alert,appropriate and cooperative throughout examination Eyes:     No corneal or conjunctival inflammation noted. EOMI. Perrla. Funduscopic exam benign, without hemorrhages, exudates or papilledema. Vision grossly normal. Ears:     External ear exam shows no significant lesions or deformities.  Otoscopic examination reveals clear canals, tympanic membranes are intact bilaterally without bulging, retraction, inflammation or discharge. Hearing is grossly normal bilaterally. Mouth:     Sutures present on upper R Neck:     No deformities, masses, or tenderness noted. Lungs:     Normal respiratory effort, chest expands symmetrically. Lungs are clear to auscultation, no crackles or wheezes. Heart:     Normal rate  and regular rhythm. S1 and S2 normal without gallop, murmur, click, rub or other extra sounds. Abdomen:     Bowel sounds positive,abdomen soft and non-tender without masses, organomegaly or hernias noted. Msk:     No deformity or scoliosis noted of thoracic or lumbar spine.   Extremities:     trace right pedal edema.   Neurologic:     No cranial nerve deficits noted. Station and gait are normal. Plantar reflexes are down-going bilaterally. DTRs are symmetrical throughout. Sensory, motor and coordinative functions appear intact. Skin:     Intact without suspicious lesions or rashes Psych:     Cognition and judgment appear intact. Alert and cooperative with normal attention span and concentration. No apparent delusions, illusions, hallucinations    Impression & Recommendations:  Problem # 1:  DVT, HX OF (ICD-V12.51)  His updated medication list for this problem includes:    Coumadin 10 Mg Tabs (Warfarin sodium) ..... One once daily exept  1.5 mg  on saturday and sunday or as dirrected   Problem # 2:  ELEVATED BP (ICD-796.2) Loose wt, NAS diet. Start on Rx if still elev The following medications were removed from the medication  list:    Spironolactone 50 Mg Tabs (Spironolactone) .Marland Kitchen... 1-2    Torsemide 100 Mg Tabs (Torsemide) .Marland Kitchen... As needed   Problem # 3:  ERECTILE DYSFUNCTION (ICD-302.72)  The following medications were removed from the medication list:    Viagra 50 Mg Tabs (Sildenafil citrate) .Marland Kitchen... As needed  His updated medication list for this problem includes:    Viagra 100 Mg Tabs (Sildenafil citrate) .Marland Kitchen... 1 by mouth once daily prn   Complete Medication List: 1)  Coumadin 10 Mg Tabs (Warfarin sodium) .... One once daily exept  1.5 mg  on saturday and sunday or as dirrected 2)  Xanax 1 Mg Tabs (Alprazolam) .... 1/2 -1 two times a day as needed 3)  Viagra 100 Mg Tabs (Sildenafil citrate) .Marland Kitchen.. 1 by mouth once daily prn 4)  Vitamin D3 1000 Unit Tabs (Cholecalciferol) .Marland Kitchen..  1 qd   Patient Instructions: 1)  Please schedule a follow-up appointment in 3 months. 2)  Limit your Sodium (Salt). 3)  You need to lose weight. Consider a lower calorie diet and regular exercise.     Prescriptions: XANAX 1 MG  TABS (ALPRAZOLAM) 1/2 -1 two times a day as needed  #60 x 6   Entered and Authorized by:   Tresa Garter MD   Signed by:   Tresa Garter MD on 01/03/2007   Method used:   Print then Give to Patient   RxID:   [id number] VIAGRA 100 MG TABS (SILDENAFIL CITRATE) 1 by mouth once daily prn  #6 x 12   Entered and Authorized by:   Tresa Garter MD   Signed by:   Tresa Garter MD on 01/03/2007   Method used:   Print then Give to Patient   RxID:   [id number] COUMADIN 10 MG  TABS (WARFARIN SODIUM) one once daily exept  1.5 mg  on Saturday and Sunday or as dirrected  #60 x 12   Entered and Authorized by:   Tresa Garter MD   Signed by:   Tresa Garter MD on 01/03/2007   Method used:   Print then Give to Patient   RxID:   [id number]  ]

## 2010-02-18 NOTE — Progress Notes (Signed)
Summary: PLEASE ADVISE ASAP  Phone Note From Other Clinic   Caller: Dr Darl Pikes [phone] Summary of Call: Pt has stopped his coumadin per Dr Darl Pikes. Please look at stat PT results and advise if pt is ok for surgery and if he should continue coumadin as advised.  Initial call taken by: Lamar Sprinkles,  December 21, 2006 5:33 PM  Follow-up for Phone Call        INR =2.3 OK for oral surgery Follow-up by: Tresa Garter MD,  December 21, 2006 6:13 PM  Additional Follow-up for Phone Call Additional follow up Details #1::        Dr Darl Pikes informed Additional Follow-up by: Lamar Sprinkles,  December 22, 2006 8:13 AM

## 2010-02-18 NOTE — Assessment & Plan Note (Signed)
Summary: 3 MO ROV /NWS  $50   Vital Signs:  Patient profile:   60 year old male Weight:      287 pounds Temp:     98.4 degrees F oral Pulse rate:   58 / minute BP sitting:   128 / 84  (left arm)  Vitals Entered By: Tora Perches (October 30, 2008 2:51 PM) CC: f/u Is Patient Diabetic? No   CC:  f/u.  History of Present Illness: The patient presents for a follow up of R post DVT syndrome, anxiety, ED   Preventive Screening-Counseling & Management  Alcohol-Tobacco     Smoking Status: quit  Current Medications (verified): 1)  Coumadin 10 Mg  Tabs (Warfarin Sodium) .... One Once Daily Exept  1.5 Tab  On Saturday and Sunday or As Dirrected 2)  Xanax 1 Mg  Tabs (Alprazolam) .... 1/2 -1 Two Times A Day As Needed 3)  Viagra 100 Mg Tabs (Sildenafil Citrate) .Marland Kitchen.. 1 By Mouth Once Daily Prn 4)  Vitamin D3 1000 Unit  Tabs (Cholecalciferol) .Marland Kitchen.. 1 Qd 5)  Cialis 20 Mg Tabs (Tadalafil) .Marland Kitchen.. 1 Tablet Every Other Day As Needed  Allergies: 1)  Erythromycin  Past History:  Past Medical History: Last updated: 01/03/2007 DVT R leg, hx of Anticoagulation therapy Osteoarthritis ED Postphlebitic swelling R leg Elev. BP 2008 Obesity  Social History: Occupation: Clinical research associate Married Former Smoker Alcohol use-no Regular exercise-started to walk, doing wts, gym  Review of Systems  The patient denies chest pain, hemoptysis, abdominal pain, melena, and hematochezia.    Physical Exam  General:  overweight-appearing.   Nose:  External nasal examination shows no deformity or inflammation. Nasal mucosa are pink and moist without lesions or exudates. Mouth:  Oral mucosa and oropharynx without lesions or exudates.  Teeth in good repair. Lungs:  Normal respiratory effort, chest expands symmetrically. Lungs are clear to auscultation, no crackles or wheezes. Heart:  Normal rate and regular rhythm. S1 and S2 normal without gallop, murmur, click, rub or other extra sounds. Abdomen:  Bowel sounds  positive,abdomen soft and non-tender without masses, organomegaly or hernias noted. Msk:  No deformity or scoliosis noted of thoracic or lumbar spine.   Extremities:  trace left pedal edema and 1 to 2+ right pedal edema.   Neurologic:  No cranial nerve deficits noted. Station and gait are normal. Plantar reflexes are down-going bilaterally. DTRs are symmetrical throughout. Sensory, motor and coordinative functions appear intact. Skin:  Intact without suspicious lesions or rashes Hyperpigmentation over RLE Psych:  Cognition and judgment appear intact. Alert and cooperative with normal attention span and concentration. No apparent delusions, illusions, hallucinations   Impression & Recommendations:  Problem # 1:  VENOUS INSUFFICIENCY, CHRONIC, RIGHT LEG (ICD-459.81) Assessment Unchanged On prescription therapy, wt loss to cont Orders: Protime INR (16109) is pending  TLB-BMP (Basic Metabolic Panel-BMET) (80048-METABOL)  Problem # 2:  EDEMA (ICD-782.3) Assessment: Unchanged as above  Problem # 3:  OBESITY (ICD-278.00) Assessment: Improved  Problem # 4:  ERECTILE DYSFUNCTION (ICD-302.72) Assessment: Unchanged  His updated medication list for this problem includes:    Viagra 100 Mg Tabs (Sildenafil citrate) .Marland Kitchen... 1 by mouth once daily prn    Cialis 20 Mg Tabs (Tadalafil) .Marland Kitchen... 1 tablet every other day as needed  Complete Medication List: 1)  Coumadin 10 Mg Tabs (Warfarin sodium) .... One once daily exept  1.5 tab  on saturday and sunday or as dirrected 2)  Xanax 1 Mg Tabs (Alprazolam) .... 1/2 -1 two times a day  as needed 3)  Viagra 100 Mg Tabs (Sildenafil citrate) .Marland Kitchen.. 1 by mouth once daily prn 4)  Vitamin D3 1000 Unit Tabs (Cholecalciferol) .Marland Kitchen.. 1 qd 5)  Cialis 20 Mg Tabs (Tadalafil) .Marland Kitchen.. 1 tablet every other day as needed  Other Orders: Admin 1st Vaccine (16109) Flu Vaccine 59yrs + (60454)  Patient Instructions: 1)  Please schedule a follow-up appointment in 3 months.  Prescriptions: XANAX 1 MG  TABS (ALPRAZOLAM) 1/2 -1 two times a day as needed  #60 x 6   Entered and Authorized by:   Tresa Garter MD   Signed by:   Tresa Garter MD on 10/30/2008   Method used:   Print then Give to Patient   RxID:   [id number]    Influenza Vaccine (to be given today)       Flu Vaccine Consent Questions     Do you have a history of severe allergic reactions to this vaccine? no    Any prior history of allergic reactions to egg and/or gelatin? no    Do you have a sensitivity to the preservative Thimersol? no    Do you have a past history of Guillan-Barre Syndrome? no    Do you currently have an acute febrile illness? no    Have you ever had a severe reaction to latex? no    Vaccine information given and explained to patient? yes    Are you currently pregnant? no    Lot Number:[id number]   Exp Date:07/18/2009   Site Given  right Deltoid IMlu

## 2010-02-18 NOTE — Assessment & Plan Note (Signed)
Summary: 3 MO ROV /NWS $50   Vital Signs:  Patient Profile:   60 Years Old Male Weight:      300 pounds Temp:     97.2 degrees F oral Pulse rate:   68 / minute BP sitting:   130 / 96  (left arm)  Vitals Entered By: Tora Perches (January 24, 2008 10:31 AM)                 Chief Complaint:  Multiple medical problems or concerns.    Current Allergies (reviewed today): ERYTHROMYCIN  Past Medical History:    Reviewed history from 01/03/2007 and no changes required:       DVT R leg, hx of       Anticoagulation therapy       Osteoarthritis       ED       Postphlebitic swelling R leg       Elev. BP 2008       Obesity   Family History:    Reviewed history from 12/02/2006 and no changes required:       Family History of CAD Male 1st degree relative <50 died at 13       M 27 alive  Social History:    Reviewed history from 07/12/2007 and no changes required:       Occupation:       Married       Former Smoker       Alcohol use-no       Regular exercise-started to walk, doing wts     Physical Exam  General:     Well-developed,well-nourished,in no acute distress; alert,appropriate and cooperative throughout examination Mouth:     Oral mucosa and oropharynx without lesions or exudates.  Teeth in good repair. Neck:     No deformities, masses, or tenderness noted. Lungs:     Normal respiratory effort, chest expands symmetrically. Lungs are clear to auscultation, no crackles or wheezes. Heart:     Normal rate and regular rhythm. S1 and S2 normal without gallop, murmur, click, rub or other extra sounds. Abdomen:     Bowel sounds positive,abdomen soft and non-tender without masses, organomegaly or hernias noted. Msk:     No deformity or scoliosis noted of thoracic or lumbar spine.   Neurologic:     No cranial nerve deficits noted. Station and gait are normal. Plantar reflexes are down-going bilaterally. DTRs are symmetrical throughout. Sensory, motor and coordinative  functions appear intact. Skin:     Intact without suspicious lesions or rashes Psych:     Cognition and judgment appear intact. Alert and cooperative with normal attention span and concentration. No apparent delusions, illusions, hallucinations    Impression & Recommendations:  Problem # 1:  OBESITY (ICD-278.00) Assessment: Deteriorated Start dieting!  Problem # 2:  ELEVATED BP (ICD-796.2) Assessment: Unchanged As per #1 BP today: 130/96 Prior BP: 130/90 (10/12/2007)  Labs Reviewed: Creat: 1.0 (10/12/2007) Chol: 152 (07/08/2007)   HDL: 32.0 (07/08/2007)   LDL: 98 (07/08/2007)   TG: 108 (07/08/2007)  Instructed in low sodium diet (DASH Handout) and behavior modification.    Problem # 3:  ERECTILE DYSFUNCTION (ICD-302.72) Assessment: Unchanged  His updated medication list for this problem includes:    Viagra 100 Mg Tabs (Sildenafil citrate) .Marland Kitchen... 1 by mouth once daily prn    Cialis 20 Mg Tabs (Tadalafil) .Marland Kitchen... 1 tablet every other day as needed   Problem # 4:  ANTICOAGULATION THERAPY (ICD-V58.61) Assessment: Unchanged Same  Rx  Complete Medication List: 1)  Coumadin 10 Mg Tabs (Warfarin sodium) .... One once daily exept  1.5 mg  on saturday and sunday or as dirrected 2)  Xanax 1 Mg Tabs (Alprazolam) .... 1/2 -1 two times a day as needed 3)  Viagra 100 Mg Tabs (Sildenafil citrate) .Marland Kitchen.. 1 by mouth once daily prn 4)  Vitamin D3 1000 Unit Tabs (Cholecalciferol) .Marland Kitchen.. 1 qd 5)  Cialis 20 Mg Tabs (Tadalafil) .Marland Kitchen.. 1 tablet every other day as needed   Patient Instructions: 1)  Please schedule a follow-up appointment in 3 months. 2)  BMP prior to visit, ICD-9: 3)  Hepatic Panel prior to visit, ICD-9: 401.1 995.2 4)  TSH prior to visit, ICD-9: 5)  [email or web address]   Prescriptions: CIALIS 20 MG TABS (TADALAFIL) 1 tablet every other day as needed  #12 x 12   Entered and Authorized by:   Tresa Garter MD   Signed by:   Tresa Garter MD on 01/24/2008    Method used:   Print then Give to Patient   RxID:   [id number] XANAX 1 MG  TABS (ALPRAZOLAM) 1/2 -1 two times a day as needed  #60 x 6   Entered and Authorized by:   Tresa Garter MD   Signed by:   Tresa Garter MD on 01/24/2008   Method used:   Print then Give to Patient   RxID:   [id number]  ]

## 2010-02-18 NOTE — Assessment & Plan Note (Signed)
Summary: 3 MO ROV /NWS $50   Vital Signs:  Patient Profile:   60 Years Old Male Weight:      291 pounds Temp:     97.5 degrees F oral Pulse rate:   65 / minute BP sitting:   130 / 90  (left arm)  Vitals Entered By: Tora Perches (October 12, 2007 10:20 AM)                 Chief Complaint:  Multiple medical problems or concerns.  History of Present Illness: The patient presents for a follow up of hypertension, diabetes, hyperlipidemia. Eating better, lost wt.     Current Allergies (reviewed today): ERYTHROMYCIN  Past Medical History:    Reviewed history from 01/03/2007 and no changes required:       DVT R leg, hx of       Anticoagulation therapy       Osteoarthritis       ED       Postphlebitic swelling R leg       Elev. BP 2008       Obesity   Family History:    Reviewed history from 12/02/2006 and no changes required:       Family History of CAD Male 1st degree relative <50 died at 85       M 43 alive  Social History:    Reviewed history from 07/12/2007 and no changes required:       Occupation:       Married       Former Smoker       Alcohol use-no       Regular exercise-started to walk, doing wts    Review of Systems  The patient denies chest pain, dyspnea on exertion, and abdominal pain.     Physical Exam  General:     overweight-appearing.   Head:     Normocephalic and atraumatic without obvious abnormalities. No apparent alopecia or balding. Nose:     External nasal examination shows no deformity or inflammation. Nasal mucosa are pink and moist without lesions or exudates. Mouth:     Oral mucosa and oropharynx without lesions or exudates.  Teeth in good repair. Neck:     No deformities, masses, or tenderness noted. Lungs:     Normal respiratory effort, chest expands symmetrically. Lungs are clear to auscultation, no crackles or wheezes. Heart:     Normal rate and regular rhythm. S1 and S2 normal without gallop, murmur, click, rub or  other extra sounds. Abdomen:     Bowel sounds positive,abdomen soft and non-tender without masses, organomegaly or hernias noted. Genitalia:     Testes bilaterally descended without nodularity, tenderness or masses. No scrotal masses or lesions. No penis lesions or urethral discharge. Msk:     No deformity or scoliosis noted of thoracic or lumbar spine.   Pulses:     R and L carotid,radial,femoral,dorsalis pedis and posterior tibial pulses are full and equal bilaterally Extremities:     trace left pedal edema and 1+ right pedal edema.   Neurologic:     No cranial nerve deficits noted. Station and gait are normal. Plantar reflexes are down-going bilaterally. DTRs are symmetrical throughout. Sensory, motor and coordinative functions appear intact. Skin:     Intact without suspicious lesions or rashes Psych:     Cognition and judgment appear intact. Alert and cooperative with normal attention span and concentration. No apparent delusions, illusions, hallucinations    Impression & Recommendations:  Problem # 1:  DVT, HX OF (ICD-V12.51) Assessment: Comment Only  His updated medication list for this problem includes:    Coumadin 10 Mg Tabs (Warfarin sodium) ..... One once daily exept  1.5 mg  on saturday and sunday or as dirrected   Problem # 2:  ANTICOAGULATION THERAPY (ICD-V58.61) Assessment: Unchanged On Rx  Problem # 3:  OBESITY (ICD-278.00) Assessment: Improved  Problem # 4:  EDEMA (ICD-782.3) Assessment: Improved  Problem # 5:  ERECTILE DYSFUNCTION (ICD-302.72) Assessment: Unchanged  His updated medication list for this problem includes:    Viagra 100 Mg Tabs (Sildenafil citrate) .Marland Kitchen... 1 by mouth once daily prn    Cialis 20 Mg Tabs (Tadalafil) .Marland Kitchen... 1 tablet every other day as needed   Complete Medication List: 1)  Coumadin 10 Mg Tabs (Warfarin sodium) .... One once daily exept  1.5 mg  on saturday and sunday or as dirrected 2)  Xanax 1 Mg Tabs (Alprazolam) .... 1/2  -1 two times a day as needed 3)  Viagra 100 Mg Tabs (Sildenafil citrate) .Marland Kitchen.. 1 by mouth once daily prn 4)  Vitamin D3 1000 Unit Tabs (Cholecalciferol) .Marland Kitchen.. 1 qd 5)  Cialis 20 Mg Tabs (Tadalafil) .Marland Kitchen.. 1 tablet every other day as needed  Other Orders: Influenza Vaccine MCR (34742)   Patient Instructions: 1)  Please schedule a follow-up appointment in 3 months.   Prescriptions: CIALIS 20 MG TABS (TADALAFIL) 1 tablet every other day as needed  #12 x 12   Entered and Authorized by:   Tresa Garter MD   Signed by:   Tresa Garter MD on 10/12/2007   Method used:   Print then Give to Patient   RxID:   [id number] XANAX 1 MG  TABS (ALPRAZOLAM) 1/2 -1 two times a day as needed  #60 x 6   Entered and Authorized by:   Tresa Garter MD   Signed by:   Tresa Garter MD on 10/12/2007   Method used:   Print then Give to Patient   RxID:   [id number] COUMADIN 10 MG  TABS (WARFARIN SODIUM) one once daily exept  1.5 mg  on Saturday and Sunday or as dirrected  #60 x 12   Entered and Authorized by:   Tresa Garter MD   Signed by:   Tresa Garter MD on 10/12/2007   Method used:   Print then Give to Patient   RxID:   [id number]  ]       Influenza Vaccine    Vaccine Type: Fluvax MCR    Site: right deltoid    Mfr: GlaxoSmithKline    Dose: 0.5 ml    Route: IM    Given by: Tora Perches    Exp. Date: 07/18/2008    Lot #: [id number]    VIS given: 08/12/06 version given October 12, 2007.  Flu Vaccine Consent Questions    Do you have a history of severe allergic reactions to this vaccine? no    Any prior history of allergic reactions to egg and/or gelatin? no    Do you have a sensitivity to the preservative Thimersol? no    Do you have a past history of Guillan-Barre Syndrome? no    Do you currently have an acute febrile illness? no    Have you ever had a severe reaction to latex? no    Vaccine information given and explained to patient?  yes

## 2010-02-18 NOTE — Assessment & Plan Note (Signed)
Summary: 2 MO ROV/NWS   Vital Signs:  Patient Profile:   60 Years Old Male Weight:      300 pounds Temp:     97.6 degrees F oral Pulse rate:   61 / minute BP sitting:   136 / 91  (left arm)  Vitals Entered By: Tora Perches (March 14, 2007 10:08 AM)             Is Patient Diabetic? No     Chief Complaint:  Multiple medical problems or concerns.    Current Allergies: ERYTHROMYCIN  Past Medical History:    Reviewed history from 01/03/2007 and no changes required:       DVT R leg, hx of       Anticoagulation therapy       Osteoarthritis       ED       Postphlebitic swelling R leg       Elev. BP 2008       Obesity    Family History:    Reviewed history from 12/02/2006 and no changes required:       Family History of CAD Male 1st degree relative <50 died at 36       M 90 alive  Social History:    Reviewed history from 12/02/2006 and no changes required:       Occupation:       Married       Former Smoker       Alcohol use-no       Regular exercise-no     Physical Exam  General:     Well-developed,well-nourished,in no acute distress; alert,appropriate and cooperative throughout examination Mouth:     Oral mucosa and oropharynx without lesions or exudates.  Teeth in good repair. Neck:     No deformities, masses, or tenderness noted. Lungs:     Normal respiratory effort, chest expands symmetrically. Lungs are clear to auscultation, no crackles or wheezes. Heart:     Normal rate and regular rhythm. S1 and S2 normal without gallop, murmur, click, rub or other extra sounds. Abdomen:     Bowel sounds positive,abdomen soft and non-tender without masses, organomegaly or hernias noted. Msk:     No deformity or scoliosis noted of thoracic or lumbar spine.   Extremities:     trace left pedal edema and trace right pedal edema.   Neurologic:     alert & oriented X3.      Impression & Recommendations:  Problem # 1:  ELEVATED BP (ICD-796.2) Low salt diet   Problem # 2:  ANTICOAGULATION THERAPY (ICD-V58.61) On Rx  Problem # 3:  ERECTILE DYSFUNCTION (ICD-302.72) Assessment: Unchanged  His updated medication list for this problem includes:    Viagra 100 Mg Tabs (Sildenafil citrate) .Marland Kitchen... 1 by mouth once daily prn   Complete Medication List: 1)  Coumadin 10 Mg Tabs (Warfarin sodium) .... One once daily exept  1.5 mg  on saturday and sunday or as dirrected 2)  Xanax 1 Mg Tabs (Alprazolam) .... 1/2 -1 two times a day as needed 3)  Viagra 100 Mg Tabs (Sildenafil citrate) .Marland Kitchen.. 1 by mouth once daily prn 4)  Vitamin D3 1000 Unit Tabs (Cholecalciferol) .Marland Kitchen.. 1 qd  Other Orders: TLB-BMP (Basic Metabolic Panel-BMET) (80048-METABOL) TLB-TSH (Thyroid Stimulating Hormone) (84443-TSH) TLB-CBC Platelet - w/Differential (85025-CBCD) TLB-A1C / Hgb A1C (Glycohemoglobin) (83036-A1C) TLB-PSA (Prostate Specific Antigen) (84153-PSA) TLB-Udip ONLY (81003-UDIP) TLB-Lipid Panel (80061-LIPID)   Patient Instructions: 1)  Please schedule a follow-up appointment in  4 months - well. 2)  BMP prior to visit, ICD-9: 3)  TSH prior to visit, ICD-9: 4)  CBC w/ Diff prior to visit, ICD-9:995.2 401.1 5)  HbgA1C prior to visit, ICD-9: 6)  Lipid Panel prior to visit, ICD-9:v 70.0 7)  Urine-dip prior to visit, ICD-9: 8)  PSA prior to visit, ICD-9:    ]

## 2010-02-18 NOTE — Letter (Signed)
Summary: Recall Colonoscopy Letter  [facility]  [address] [place], [place] [address]   Phone: [phone]  Fax: [phone]      March 14, 2008 MRN: [id number]   ALMON WHITFORD [address] [place], [place]  [address]   Dear Mr. Belvedere,   According to your medical record, it is time for you to schedule a Colonoscopy. The American Cancer Society recommends this procedure as a method to detect early colon cancer. Patients with a family history of colon cancer, or a personal history of colon polyps or inflammatory bowel disease are at increased risk.  This letter has beeen generated based on the recommendations made at the time of your procedure. If you feel that in your particular situation this may no longer apply, please contact our office.  Please call our office at [phone] to schedule this appointment or to update your records at your earliest convenience.  Thank you for cooperating with [place] to provide you with the very best care possible.   Sincerely,  Barbette Hair. Arlyce Dice, M.D.  [facility] [phone]

## 2010-02-18 NOTE — Assessment & Plan Note (Signed)
Summary: 3 mos f/u  $50 cd   Vital Signs:  Patient profile:   60 year old male Height:      75 inches Weight:      295 pounds BMI:     37.01 Temp:     98.3 degrees F oral Pulse rate:   63 / minute BP sitting:   116 / 84  (left arm)  Vitals Entered By: Tora Perches (May 01, 2008 10:41 AM) CC: f/u Is Patient Diabetic? No   CC:  f/u.  History of Present Illness: The patient presents for a follow up of hypertension,DVT, hyperlipidemia   Current Medications (verified): 1)  Coumadin 10 Mg  Tabs (Warfarin Sodium) .... One Once Daily Exept  1.5 Tab  On Saturday and Sunday or As Dirrected 2)  Xanax 1 Mg  Tabs (Alprazolam) .... 1/2 -1 Two Times A Day As Needed 3)  Viagra 100 Mg Tabs (Sildenafil Citrate) .Marland Kitchen.. 1 By Mouth Once Daily Prn 4)  Vitamin D3 1000 Unit  Tabs (Cholecalciferol) .Marland Kitchen.. 1 Qd 5)  Cialis 20 Mg Tabs (Tadalafil) .Marland Kitchen.. 1 Tablet Every Other Day As Needed  Allergies: 1)  Erythromycin  Past History:  Past Medical History:    DVT R leg, hx of    Anticoagulation therapy    Osteoarthritis    ED    Postphlebitic swelling R leg    Elev. BP 2008    Obesity (01/03/2007)  Past Surgical History:    Herniorrhaphy (08/30/2006)  Family History:    Family History of CAD Male 1st degree relative <50 died at 27    M 4 alive     (12/23/06)  Social History:    Occupation: Clinical research associate    Married    Former Smoker    Alcohol use-no    Regular exercise-started to walk, doing wts     (05/01/2008)  Risk Factors:    Alcohol Use: N/A    >5 drinks/d w/in last 3 months: N/A    Caffeine Use: N/A    Diet: N/A    Exercise: no (12-23-2006)  Risk Factors:    Smoking Status: quit (23-Dec-2006)    Packs/Day: N/A    Cigars/wk: N/A    Pipe Use/wk: N/A    Cans of tobacco/wk: N/A    Passive Smoke Exposure: N/A  Family History:    Reviewed history from 12-23-06 and no changes required:       Family History of CAD Male 1st degree relative <50 died at 32       M 65 alive   Social History:    Reviewed history from 07/12/2007 and no changes required:       Occupation: Clinical research associate       Married       Former Smoker       Alcohol use-no       Regular exercise-started to walk, doing wts  Physical Exam  General:  overweight-appearing.   Head:  normocephalic.   Eyes:  vision grossly intact.   Nose:  External nasal examination shows no deformity or inflammation. Nasal mucosa are pink and moist without lesions or exudates. Mouth:  Oral mucosa and oropharynx without lesions or exudates.  Teeth in good repair. Neck:  No deformities, masses, or tenderness noted. Lungs:  Normal respiratory effort, chest expands symmetrically. Lungs are clear to auscultation, no crackles or wheezes. Heart:  Normal rate and regular rhythm. S1 and S2 normal without gallop, murmur, click, rub or other extra sounds. Abdomen:  Bowel sounds positive,abdomen  soft and non-tender without masses, organomegaly or hernias noted. Genitalia:  Testes bilaterally descended without nodularity, tenderness or masses. No scrotal masses or lesions. No penis lesions or urethral discharge. Msk:  No deformity or scoliosis noted of thoracic or lumbar spine.   Extremities:  trace left pedal edema and 1+ right pedal edema.   Neurologic:  No cranial nerve deficits noted. Station and gait are normal. Plantar reflexes are down-going bilaterally. DTRs are symmetrical throughout. Sensory, motor and coordinative functions appear intact. Skin:  Intact without suspicious lesions or rashes Psych:  Cognition and judgment appear intact. Alert and cooperative with normal attention span and concentration. No apparent delusions, illusions, hallucinations   Impression & Recommendations:  Problem # 1:  EDEMA (ICD-782.3)  Assessment Improved  Orders: TLB-BMP (Basic Metabolic Panel-BMET) (80048-METABOL) TLB-TSH (Thyroid Stimulating Hormone) (84443-TSH) TLB-A1C / Hgb A1C (Glycohemoglobin) (83036-A1C)  Problem # 2:  OBESITY  (ICD-278.00) Assessment: Improved  Problem # 3:  ANTICOAGULATION THERAPY (ICD-V58.61) Assessment: Unchanged  Orders: TLB-PT (Protime) (85610-PTP)  Problem # 4:  VENOUS INSUFFICIENCY, CHRONIC, RIGHT LEG (ICD-459.81) Assessment: Unchanged Cont w/wt loss  Complete Medication List: 1)  Coumadin 10 Mg Tabs (Warfarin sodium) .... One once daily exept  1.5 tab  on saturday and sunday or as dirrected 2)  Xanax 1 Mg Tabs (Alprazolam) .... 1/2 -1 two times a day as needed 3)  Viagra 100 Mg Tabs (Sildenafil citrate) .Marland Kitchen.. 1 by mouth once daily prn 4)  Vitamin D3 1000 Unit Tabs (Cholecalciferol) .Marland Kitchen.. 1 qd 5)  Cialis 20 Mg Tabs (Tadalafil) .Marland Kitchen.. 1 tablet every other day as needed  Other Orders: TLB-PSA (Prostate Specific Antigen) (84153-PSA)  Patient Instructions: 1)  Please schedule a follow-up appointment in 3 months. Prescriptions: CIALIS 20 MG TABS (TADALAFIL) 1 tablet every other day as needed  #12 x 12   Entered and Authorized by:   Tresa Garter MD   Signed by:   Tresa Garter MD on 05/01/2008   Method used:   Print then Give to Patient   RxID:   [id number] VIAGRA 100 MG TABS (SILDENAFIL CITRATE) 1 by mouth once daily prn  #6 x 12   Entered and Authorized by:   Tresa Garter MD   Signed by:   Tresa Garter MD on 05/01/2008   Method used:   Print then Give to Patient   RxID:   [id number]

## 2010-02-18 NOTE — Assessment & Plan Note (Signed)
Summary: 3 mos f/u / # cd   Vital Signs:  Patient profile:   60 year old male Height:      75 inches Weight:      292 pounds BMI:     36.63 O2 Sat:      96 % on Room air Temp:     98.0 degrees F oral Pulse rate:   64 / minute Pulse rhythm:   regular Resp:     16 per minute BP sitting:   124 / 86  (left arm) Cuff size:   large  Vitals Entered By: Lanier Prude, CMA(AAMA) (August 07, 2009 1:21 PM)  O2 Flow:  Room air CC: 3 mo f/u Is Patient Diabetic? No Comments pt needs Rf on Alprazolam 1mg    CC:  3 mo f/u.  History of Present Illness: C/o B knees hurting, stiff x 2 years F/u DVT, ED  Current Medications (verified): 1)  Coumadin 10 Mg  Tabs (Warfarin Sodium) .... One Once Daily Exept  1.5 Tab  On Saturday and Sunday or As Dirrected 2)  Xanax 1 Mg  Tabs (Alprazolam) .... 1/2 -1 Two Times A Day As Needed 3)  Viagra 100 Mg Tabs (Sildenafil Citrate) .Marland Kitchen.. 1 By Mouth Once Daily Prn 4)  Vitamin D3 1000 Unit  Tabs (Cholecalciferol) .Marland Kitchen.. 1 Qd 5)  Cialis 20 Mg Tabs (Tadalafil) .Marland Kitchen.. 1 Tablet Every Other Day As Needed  Allergies (verified): 1)  Erythromycin  Past History:  Past Medical History: Last updated: 01/03/2007 DVT R leg, hx of Anticoagulation therapy Osteoarthritis ED Postphlebitic swelling R leg Elev. BP 2008 Obesity  Social History: Last updated: 10/30/2008 Occupation: Clinical research associate Married Former Smoker Alcohol use-no Regular exercise-started to walk, doing wts, gym  Review of Systems  The patient denies fever, chest pain, and abdominal pain.    Physical Exam  General:  overweight-appearing.   Nose:  External nasal examination shows no deformity or inflammation. Nasal mucosa are pink and moist without lesions or exudates. Mouth:  Oral mucosa and oropharynx without lesions or exudates.  Teeth in good repair. Lungs:  Normal respiratory effort, chest expands symmetrically. Lungs are clear to auscultation, no crackles or wheezes. Heart:  Normal rate and  regular rhythm. S1 and S2 normal without gallop, murmur, click, rub or other extra sounds. Abdomen:  Bowel sounds positive,abdomen soft and non-tender without masses, organomegaly or hernias noted. Msk:  No deformity or scoliosis noted of thoracic or lumbar spine.   Extremities:  trace left pedal edema and 1 to 2+ right pedal edema.   Neurologic:  alert & oriented X3.   Skin:  Intact without suspicious lesions or rashes Hyperpigmentation over RLE Psych:  Cognition and judgment appear intact. Alert and cooperative with normal attention span and concentration. No apparent delusions, illusions, hallucinations   Impression & Recommendations:  Problem # 1:  OBESITY (ICD-278.00) Assessment Unchanged Wt loss  need was discussed again; he will try a diet  Problem # 2:  KNEE PAIN (ICD-719.46) B Assessment: Deteriorated Treatment options were discussed  Problem # 3:  ELEVATED BP (ICD-796.2) Assessment: Comment Only  Problem # 4:  ANTICOAGULATION THERAPY (ICD-V58.61) Assessment: Unchanged PT is pending   Problem # 5:  EDEMA (ICD-782.3)  Problem # 6:  VENOUS INSUFFICIENCY, CHRONIC, RIGHT LEG (ICD-459.81)  Complete Medication List: 1)  Coumadin 10 Mg Tabs (Warfarin sodium) .... One once daily exept  1.5 tab  on saturday and sunday or as dirrected 2)  Xanax 1 Mg Tabs (Alprazolam) .... 1/2 -1 two times  a day as needed 3)  Viagra 100 Mg Tabs (Sildenafil citrate) .Marland Kitchen.. 1 by mouth once daily prn 4)  Vitamin D3 1000 Unit Tabs (Cholecalciferol) .Marland Kitchen.. 1 qd 5)  Cialis 20 Mg Tabs (Tadalafil) .Marland Kitchen.. 1 tablet every other day as needed 6)  Pennsaid 1.5 % Soln (Diclofenac sodium) .... 3-5 gtt on skin three times a day for pain  Patient Instructions: 1)  Please schedule a follow-up appointment in 3 months well exam w/me. Prescriptions: PENNSAID 1.5 % SOLN (DICLOFENAC SODIUM) 3-5 gtt on skin three times a day for pain  #1 x 3   Entered and Authorized by:   Tresa Garter MD   Signed by:   Tresa Garter MD on 08/07/2009   Method used:   Print then Give to Patient   RxID:   [id number] XANAX 1 MG  TABS (ALPRAZOLAM) 1/2 -1 two times a day as needed  #60 x 6   Entered and Authorized by:   Tresa Garter MD   Signed by:   Tresa Garter MD on 08/07/2009   Method used:   Print then Give to Patient   RxID:   [id number]

## 2010-02-18 NOTE — Progress Notes (Signed)
Summary: Joseph [name]   Phone Note From Pharmacy   Caller: matt - [facility] [phone] Summary of Call: Matt at [facility] called and wants the instructions for pt Joseph [name] to be clarified....instrcutions states 1 once daily except 1.5mg  on sat and sunday....the pharmacy is asking for you to clarify what you mean for sat and sund. because pt take 10mg  pills Initial call taken by: Windell Norfolk,  January 30, 2008 3:16 PM  Follow-up for Phone Call        PLs confirm w/pt how he is taking it Follow-up by: Tresa Garter MD,  January 31, 2008 7:43 AM  Additional Follow-up for Phone Call Additional follow up Details #1::        pt states tabs are 10mg  ............and he takes 1 tab once daily and .Marland Kitchen..Marland Kitchenon sat and sun takes 1 1/2 tabs  equally 15mg     so is this ok to relay to [facility]? Additional Follow-up by: Windell Norfolk,  January 31, 2008 11:06 AM    Additional Follow-up for Phone Call Additional follow up Details #2::    OK  Thx, Follow-up by: Tresa Garter MD,  January 31, 2008 4:10 PM  Additional Follow-up for Phone Call Additional follow up Details #3:: Details for Additional Follow-up Action Taken: called Matt back and corrected instructions Additional Follow-up by: Windell Norfolk,  January 31, 2008 4:21 PM  New/Updated Medications: COUMADIN 10 MG  TABS (WARFARIN SODIUM) one once daily exept  1.5 tab  on Saturday and Sunday or as dirrected

## 2010-02-18 NOTE — Assessment & Plan Note (Signed)
Summary: 3 MO FU/$50/PN   Vital Signs:  Patient profile:   60 year old male Weight:      292 pounds Temp:     99.2 degrees F oral Pulse rate:   82 / minute BP sitting:   126 / 86  (left arm)  Vitals Entered By: Tora Perches (July 31, 2008 1:33 PM) CC: f/u Is Patient Diabetic? No   CC:  f/u.  History of Present Illness: The patient presents for a follow up of anticoagulation, ED, LE edema   Current Medications (verified): 1)  Coumadin 10 Mg  Tabs (Warfarin Sodium) .... One Once Daily Exept  1.5 Tab  On Saturday and Sunday or As Dirrected 2)  Xanax 1 Mg  Tabs (Alprazolam) .... 1/2 -1 Two Times A Day As Needed 3)  Viagra 100 Mg Tabs (Sildenafil Citrate) .Marland Kitchen.. 1 By Mouth Once Daily Prn 4)  Vitamin D3 1000 Unit  Tabs (Cholecalciferol) .Marland Kitchen.. 1 Qd 5)  Cialis 20 Mg Tabs (Tadalafil) .Marland Kitchen.. 1 Tablet Every Other Day As Needed  Allergies: 1)  Erythromycin  Past History:  Past Medical History: Last updated: 01/03/2007 DVT R leg, hx of Anticoagulation therapy Osteoarthritis ED Postphlebitic swelling R leg Elev. BP 2008 Obesity  Past Surgical History: Last updated: 08/30/2006 Herniorrhaphy  Family History: Last updated: 12/02/2006 Family History of CAD Male 1st degree relative <50 died at 64 M 8 alive  Social History: Last updated: 05/01/2008 Occupation: Clinical research associate Married Former Smoker Alcohol use-no Regular exercise-started to walk, doing wts  Family History: Reviewed history from 12/02/2006 and no changes required. Family History of CAD Male 1st degree relative <50 died at 60 M 41 alive  Social History: Reviewed history from 05/01/2008 and no changes required. Occupation: Clinical research associate Married Former Smoker Alcohol use-no Regular exercise-started to walk, doing wts  Physical Exam  General:  overweight-appearing.   Ears:  External ear exam shows no significant lesions or deformities.  Otoscopic examination reveals clear canals, tympanic membranes are intact  bilaterally without bulging, retraction, inflammation or discharge. Hearing is grossly normal bilaterally. Mouth:  Oral mucosa and oropharynx without lesions or exudates.  Teeth in good repair. Lungs:  Normal respiratory effort, chest expands symmetrically. Lungs are clear to auscultation, no crackles or wheezes. Heart:  Normal rate and regular rhythm. S1 and S2 normal without gallop, murmur, click, rub or other extra sounds. Abdomen:  Bowel sounds positive,abdomen soft and non-tender without masses, organomegaly or hernias noted. Msk:  No deformity or scoliosis noted of thoracic or lumbar spine.   Neurologic:  No cranial nerve deficits noted. Station and gait are normal. Plantar reflexes are down-going bilaterally. DTRs are symmetrical throughout. Sensory, motor and coordinative functions appear intact. Skin:  Intact without suspicious lesions or rashes Psych:  Cognition and judgment appear intact. Alert and cooperative with normal attention span and concentration. No apparent delusions, illusions, hallucinations   Impression & Recommendations:  Problem # 1:  OSTEOARTHRITIS (ICD-715.90) Assessment Comment Only  Problem # 2:  VENOUS INSUFFICIENCY, CHRONIC, RIGHT LEG (ICD-459.81) Assessment: Comment Only On prescription therapy   Problem # 3:  EDEMA (ICD-782.3) Assessment: Unchanged  Orders: TLB-TSH (Thyroid Stimulating Hormone) (84443-TSH) TLB-BMP (Basic Metabolic Panel-BMET) (80048-METABOL)  Problem # 4:  ANTICOAGULATION THERAPY (ICD-V58.61)  Orders: [facility] INR (16109) is pending   Complete Medication List: 1)  Coumadin 10 Mg Tabs (Warfarin sodium) .... One once daily exept  1.5 tab  on saturday and sunday or as dirrected 2)  Xanax 1 Mg Tabs (Alprazolam) .... 1/2 -1 two times a  day as needed 3)  Viagra 100 Mg Tabs (Sildenafil citrate) .Marland Kitchen.. 1 by mouth once daily prn 4)  Vitamin D3 1000 Unit Tabs (Cholecalciferol) .Marland Kitchen.. 1 qd 5)  Cialis 20 Mg Tabs (Tadalafil) .Marland Kitchen.. 1 tablet every  other day as needed  Patient Instructions: 1)  Start taking a yoga class 2)  Try to eat more raw plant food, fresh and dry fruit, raw almonds, leafy vegies, whole foods and less red meat, less animal fat. Avoid processed foods (canned soups, hot dogs, sausage , frozen dinners). Avoid corn syrup or aspartam and Splenda  containing drinks. Make your own salad dressing with olive oil, wine vinegar, garlic etc. 3)  [email or web address].com 4)  www.rawfamily.com 5)  Please schedule a follow-up appointment in 3 months. Prescriptions: CIALIS 20 MG TABS (TADALAFIL) 1 tablet every other day as needed  #12 x 12   Entered and Authorized by:   Tresa Garter MD   Signed by:   Tresa Garter MD on 07/31/2008   Method used:   Print then Give to Patient   RxID:   [id number] XANAX 1 MG  TABS (ALPRAZOLAM) 1/2 -1 two times a day as needed  #60 x 6   Entered and Authorized by:   Tresa Garter MD   Signed by:   Tresa Garter MD on 07/31/2008   Method used:   Print then Give to Patient   RxID:   [id number] COUMADIN 10 MG  TABS (WARFARIN SODIUM) one once daily exept  1.5 tab  on Saturday and Sunday or as dirrected  #45 x 12   Entered and Authorized by:   Tresa Garter MD   Signed by:   Tresa Garter MD on 07/31/2008   Method used:   Print then Give to Patient   RxID:   [id number]

## 2010-02-18 NOTE — Progress Notes (Signed)
Summary: Schedule Colonoscopy  Phone Note Outgoing Call Call back at [facility] Phone [phone]   Call placed by: Harlow Mares CMA Duncan Dull),  August 02, 2009 4:03 PM Call placed to: Patient Summary of Call: Left message on patients machine to call back. patient is due for his colonoscopy Initial call taken by: Harlow Mares CMA Duncan Dull),  August 02, 2009 4:03 PM  Follow-up for Phone Call        Left a message on the patient machine to call back and schedule a previsit and procedure with our office. A letter will be mailed to the patient.   Follow-up by: Harlow Mares CMA Duncan Dull),  August 09, 2009 4:33 PM

## 2010-02-18 NOTE — Assessment & Plan Note (Signed)
Summary: 6 MTH WELLNESS-$50-STC   Vital Signs:  Patient Profile:   60 Years Old Male Weight:      293 pounds Temp:     98.3 degrees F oral Pulse rate:   67 / minute BP sitting:   152 / 97  (right arm)  Vitals Entered By: Tora Perches (July 12, 2007 2:43 PM)                 Chief Complaint:  Multiple medical problems or concerns.  History of Present Illness: The patient presents for a wellness examination. BP 130/80 at home     Current Allergies (reviewed today): ERYTHROMYCIN  Past Medical History:    Reviewed history from 01/03/2007 and no changes required:       DVT R leg, hx of       Anticoagulation therapy       Osteoarthritis       ED       Postphlebitic swelling R leg       Elev. BP 2008       Obesity  Past Surgical History:    Reviewed history from 08/30/2006 and no changes required:       Herniorrhaphy   Family History:    Reviewed history from 12/02/2006 and no changes required:       Family History of CAD Male 1st degree relative <50 died at 81       M 33 alive  Social History:    Reviewed history from 12/02/2006 and no changes required:       Occupation:       Married       Former Smoker       Alcohol use-no       Regular exercise-started to walk, doing wts    Review of Systems  The patient denies anorexia, fever, weight loss, weight gain, vision loss, decreased hearing, hoarseness, chest pain, syncope, dyspnea on exertion, peripheral edema, prolonged cough, headaches, hemoptysis, abdominal pain, melena, hematochezia, severe indigestion/heartburn, hematuria, incontinence, genital sores, muscle weakness, suspicious skin lesions, transient blindness, difficulty walking, depression, unusual weight change, abnormal bleeding, enlarged lymph nodes, angioedema, and testicular masses.         On diet   Physical Exam  General:     overweight-appearing.   Head:     Normocephalic and atraumatic without obvious abnormalities. No apparent alopecia  or balding. Eyes:     L subconj. hemorrhage Ears:     External ear exam shows no significant lesions or deformities.  Otoscopic examination reveals clear canals, tympanic membranes are intact bilaterally without bulging, retraction, inflammation or discharge. Hearing is grossly normal bilaterally. Nose:     External nasal examination shows no deformity or inflammation. Nasal mucosa are pink and moist without lesions or exudates. Mouth:     Oral mucosa and oropharynx without lesions or exudates.  Teeth in good repair. Neck:     No deformities, masses, or tenderness noted. Lungs:     Normal respiratory effort, chest expands symmetrically. Lungs are clear to auscultation, no crackles or wheezes. Heart:     Normal rate and regular rhythm. S1 and S2 normal without gallop, murmur, click, rub or other extra sounds. Abdomen:     Bowel sounds positive,abdomen soft and non-tender without masses, organomegaly or obvious hernias noted. Rectal:     No external abnormalities noted. Normal sphincter tone. No rectal masses or tenderness. Genitalia:     Testes bilaterally descended without nodularity, tenderness or masses. No scrotal masses  or lesions. No penis lesions or urethral discharge. Prostate:     Prostate gland firm and smooth, no enlargement, nodularity, tenderness, mass, asymmetry or induration. Msk:     No deformity or scoliosis noted of thoracic or lumbar spine.   Pulses:     R and L carotid,radial,femoral,dorsalis pedis and posterior tibial pulses are full and equal bilaterally Extremities:     trace left pedal edema and 1+ right pedal edema.   Skin:     Intact without suspicious lesions or rashes Inguinal Nodes:     No significant adenopathy Psych:     Cognition and judgment appear intact. Alert and cooperative with normal attention span and concentration. No apparent delusions, illusions, hallucinations    Impression & Recommendations:  Problem # 1:  WELL ADULT EXAM (ICD-V70.0)  Assessment: Comment Only  Reviewed preventive care protocols, scheduled due services, and updated immunizations. The labs were reviewd with the patient. Cont w/wt loss and exercise - elev glu discussed.   Problem # 2:  ANTICOAGULATION THERAPY (ICD-V58.61) Assessment: Unchanged INR q 1 mo  Problem # 3:  ELEVATED BP (ICD-796.2) Assessment: Improved OK BP at home  Problem # 4:  ERECTILE DYSFUNCTION (ICD-302.72) Assessment: Unchanged  His updated medication list for this problem includes:    Viagra 100 Mg Tabs (Sildenafil citrate) .Marland Kitchen... 1 by mouth once daily prn   Problem # 5:  SUBCONJUNCTIVAL HEMORRHAGE, LEFT (ICD-372.72) Assessment: [place]. Get INR  Complete Medication List: 1)  Coumadin 10 Mg Tabs (Warfarin sodium) .... One once daily exept  1.5 mg  on saturday and sunday or as dirrected 2)  Xanax 1 Mg Tabs (Alprazolam) .... 1/2 -1 two times a day as needed 3)  Viagra 100 Mg Tabs (Sildenafil citrate) .Marland Kitchen.. 1 by mouth once daily prn 4)  Vitamin D3 1000 Unit Tabs (Cholecalciferol) .Marland Kitchen.. 1 qd   Patient Instructions: 1)  Please schedule a follow-up appointment in 4 months. 2)  BMP prior to visit, ICD-9: 3)  HbgA1C prior to visit, ICD-9:   Prescriptions: XANAX 1 MG  TABS (ALPRAZOLAM) 1/2 -1 two times a day as needed  #60 x 6   Entered and Authorized by:   Tresa Garter MD   Signed by:   Tresa Garter MD on 07/12/2007   Method used:   Print then Give to Patient   RxID:   [id number]  ]

## 2010-02-18 NOTE — Assessment & Plan Note (Signed)
Summary: 3 MO ROV /NWS  #   Vital Signs:  Patient profile:   60 year old male Height:      75 inches Weight:      290 pounds BMI:     36.38 O2 Sat:      95 % on Room air Temp:     98.4 degrees F oral Pulse rate:   63 / minute BP sitting:   124 / 82  (left arm) Cuff size:   large  Vitals Entered By: Lucious Groves (May 07, 2009 1:27 PM)  O2 Flow:  Room air CC: 3 mo rtn ov./kb Is Patient Diabetic? No Pain Assessment Patient in pain? no        CC:  3 mo rtn ov./kb.  History of Present Illness: The patient presents for a follow up of edema, anxiety, ED   Current Medications (verified): 1)  Coumadin 10 Mg  Tabs (Warfarin Sodium) .... One Once Daily Exept  1.5 Tab  On Saturday and Sunday or As Dirrected 2)  Xanax 1 Mg  Tabs (Alprazolam) .... 1/2 -1 Two Times A Day As Needed 3)  Viagra 100 Mg Tabs (Sildenafil Citrate) .Marland Kitchen.. 1 By Mouth Once Daily Prn 4)  Vitamin D3 1000 Unit  Tabs (Cholecalciferol) .Marland Kitchen.. 1 Qd 5)  Cialis 20 Mg Tabs (Tadalafil) .Marland Kitchen.. 1 Tablet Every Other Day As Needed  Allergies (verified): 1)  Erythromycin  Past History:  Past Medical History: Last updated: 01/03/2007 DVT R leg, hx of Anticoagulation therapy Osteoarthritis ED Postphlebitic swelling R leg Elev. BP 2008 Obesity  Social History: Last updated: 10/30/2008 Occupation: Clinical research associate Married Former Smoker Alcohol use-no Regular exercise-started to walk, doing wts, gym  Family History: Reviewed history from 12/02/2006 and no changes required. Family History of CAD Male 1st degree relative <50 died at 62 M 28 alive  Social History: Reviewed history from 10/30/2008 and no changes required. Occupation: Clinical research associate Married Former Smoker Alcohol use-no Regular exercise-started to walk, doing wts, gym  Review of Systems  The patient denies weight loss, weight gain, chest pain, dyspnea on exertion, abdominal pain, melena, and hematochezia.    Physical Exam  General:  overweight-appearing.     Nose:  External nasal examination shows no deformity or inflammation. Nasal mucosa are pink and moist without lesions or exudates. Mouth:  Oral mucosa and oropharynx without lesions or exudates.  Teeth in good repair. Lungs:  Normal respiratory effort, chest expands symmetrically. Lungs are clear to auscultation, no crackles or wheezes. Heart:  Normal rate and regular rhythm. S1 and S2 normal without gallop, murmur, click, rub or other extra sounds. Abdomen:  Bowel sounds positive,abdomen soft and non-tender without masses, organomegaly or hernias noted. Msk:  No deformity or scoliosis noted of thoracic or lumbar spine.   Extremities:  trace left pedal edema and 1 to 2+ right pedal edema.   Neurologic:  alert & oriented X3.   Skin:  Intact without suspicious lesions or rashes Hyperpigmentation over RLE Psych:  Cognition and judgment appear intact. Alert and cooperative with normal attention span and concentration. No apparent delusions, illusions, hallucinations   Impression & Recommendations:  Problem # 1:  DVT, HX OF (ICD-V12.51) Assessment Unchanged  His updated medication list for this problem includes:    Coumadin 10 Mg Tabs (Warfarin sodium) ..... One once daily exept  1.5 tab  on saturday and sunday or as dirrected  Problem # 2:  ANTICOAGULATION THERAPY (ICD-V58.61) Assessment: Unchanged  Orders: INR/PT-[facility] (82956) TLB-BMP (Basic Metabolic Panel-BMET) (80048-METABOL)  Problem #  3:  ERECTILE DYSFUNCTION (ICD-302.72) Assessment: Improved  His updated medication list for this problem includes:    Viagra 100 Mg Tabs (Sildenafil citrate) .Marland Kitchen... 1 by mouth once daily prn    Cialis 20 Mg Tabs (Tadalafil) .Marland Kitchen... 1 tablet every other day as needed  Problem # 4:  OSTEOARTHRITIS (ICD-715.90) Assessment: Unchanged  Complete Medication List: 1)  Coumadin 10 Mg Tabs (Warfarin sodium) .... One once daily exept  1.5 tab  on saturday and sunday or as dirrected 2)  Xanax 1 Mg Tabs  (Alprazolam) .... 1/2 -1 two times a day as needed 3)  Viagra 100 Mg Tabs (Sildenafil citrate) .Marland Kitchen.. 1 by mouth once daily prn 4)  Vitamin D3 1000 Unit Tabs (Cholecalciferol) .Marland Kitchen.. 1 qd 5)  Cialis 20 Mg Tabs (Tadalafil) .Marland Kitchen.. 1 tablet every other day as needed  Patient Instructions: 1)  Please schedule a follow-up appointment in 3 months. Prescriptions: VIAGRA 100 MG TABS (SILDENAFIL CITRATE) 1 by mouth once daily prn  #6 x 12   Entered and Authorized by:   Tresa Garter MD   Signed by:   Tresa Garter MD on 05/07/2009   Method used:   Print then Give to Patient   RxID:   [id number]

## 2010-02-26 NOTE — Assessment & Plan Note (Signed)
Summary: f/u appt#/cd   Vital Signs:  Patient profile:   60 year old male Height:      75 inches Weight:      283 pounds BMI:     35.50 Temp:     98.8 degrees F oral Pulse rate:   88 / minute Pulse rhythm:   regular Resp:     16 per minute BP sitting:   118 / 84  (left arm) Cuff size:   large  Vitals Entered By: Lanier Prude, CMA(AAMA) (February 18, 2010 10:18 AM) CC: f/u  Is Patient Diabetic? No Comments pt is not using Pennsaid   CC:  f/u .  History of Present Illness: The patient presents for a follow up of hypothyroidism, ED, anticoagulation. C/o R ear has ? fluid C/o a spot on back with itching  Current Medications (verified): 1)  Coumadin 10 Mg  Tabs (Warfarin Sodium) .... One Once Daily Exept  1.5 Tab  On Saturday and Sunday or As Dirrected 2)  Xanax 1 Mg  Tabs (Alprazolam) .... 1/2 -1 Two Times A Day As Needed 3)  Viagra 100 Mg Tabs (Sildenafil Citrate) .Marland Kitchen.. 1 By Mouth Once Daily Prn 4)  Vitamin D3 1000 Unit  Tabs (Cholecalciferol) .Marland Kitchen.. 1 Qd 5)  Cialis 20 Mg Tabs (Tadalafil) .Marland Kitchen.. 1 Tablet Every Other Day As Needed 6)  Pennsaid 1.5 % Soln (Diclofenac Sodium) .... 3-5 Gtt On Skin Three Times A Day For Pain 7)  Synthroid 25 Mcg Tabs (Levothyroxine Sodium) .Marland Kitchen.. 1 By Mouth Once Daily For Thyroid  Allergies (verified): 1)  Erythromycin  Past History:  Social History: Last updated: 10/30/2008 Occupation: Clinical research associate Married Former Smoker Alcohol use-no Regular exercise-started to walk, doing wts, gym  Past Medical History: DVT R leg, hx of Anticoagulation therapy Osteoarthritis ED Postphlebitic swelling R leg Elev. BP 2008 Obesity Hypothyroidism 2011 Anxiety  Review of Systems  The patient denies weight loss, weight gain, dyspnea on exertion, prolonged cough, and abdominal pain.         on a diet  Physical Exam  General:  overweight-appearing.   Nose:  External nasal examination shows no deformity or inflammation. Nasal mucosa are pink and moist  without lesions or exudates. Mouth:  Oral mucosa and oropharynx without lesions or exudates.  Teeth in good repair. Lungs:  Normal respiratory effort, chest expands symmetrically. Lungs are clear to auscultation, no crackles or wheezes. Heart:  Normal rate and regular rhythm. S1 and S2 normal without gallop, murmur, click, rub or other extra sounds. Abdomen:  Bowel sounds positive,abdomen soft and non-tender without masses, organomegaly or hernias noted. Msk:  No deformity or scoliosis noted of thoracic or lumbar spine.   Extremities:  trace left pedal edema and 1+ right pedal edema.   Neurologic:  alert & oriented X3.   Skin:  Intact without suspicious lesions or rashes Hyperpigmentation over RLE 4 mm mole in RUQ Psych:  Cognition and judgment appear intact. Alert and cooperative with normal attention span and concentration. No apparent delusions, illusions, hallucinations   Impression & Recommendations:  Problem # 1:  HYPOTHYROIDISM (ICD-244.9) Assessment Unchanged  His updated medication list for this problem includes:    Synthroid 25 Mcg Tabs (Levothyroxine sodium) .Marland Kitchen... 1 by mouth once daily for thyroid  Problem # 2:  EDEMA (ICD-782.3) Assessment: Improved  Problem # 3:  OBESITY (ICD-278.00) Assessment: Unchanged On a diet  Problem # 4:  ERECTILE DYSFUNCTION (ICD-302.72) Assessment: Unchanged  The following medications were removed from the medication list:  Viagra 100 Mg Tabs (Sildenafil citrate) .Marland Kitchen... 1 by mouth once daily prn    Cialis 20 Mg Tabs (Tadalafil) .Marland Kitchen... 1 tablet every other day as needed His updated medication list for this problem includes:    Cialis 5 Mg Tabs (Tadalafil) .Marland Kitchen... 1 by mouth qd  Problem # 5:  ANXIETY (ICD-300.00) Assessment: Unchanged  His updated medication list for this problem includes:    Xanax 1 Mg Tabs (Alprazolam) .Marland Kitchen... 1/2 -1 two times a day as needed  Complete Medication List: 1)  Coumadin 10 Mg Tabs (Warfarin sodium) .... One  once daily exept  1.5 tab  on saturday and sunday or as dirrected 2)  Xanax 1 Mg Tabs (Alprazolam) .... 1/2 -1 two times a day as needed 3)  Vitamin D3 1000 Unit Tabs (Cholecalciferol) .Marland Kitchen.. 1 qd 4)  Pennsaid 1.5 % Soln (Diclofenac sodium) .... 3-5 gtt on skin three times a day for pain 5)  Synthroid 25 Mcg Tabs (Levothyroxine sodium) .Marland Kitchen.. 1 by mouth once daily for thyroid 6)  Cialis 5 Mg Tabs (Tadalafil) .Marland Kitchen.. 1 by mouth qd 7)  Amoxicillin 500 Mg Caps (Amoxicillin) .... 2 caps by mouth bid  Patient Instructions: 1)  Please schedule a follow-up appointment in 3 months. 2)  BMP prior to visit, ICD-9: 3)  INR v58.61 4)  TSH prior to visit, ICD-9: Prescriptions: AMOXICILLIN 500 MG CAPS (AMOXICILLIN) 2 caps by mouth bid  #40 x 0   Entered and Authorized by:   Tresa Garter MD   Signed by:   Tresa Garter MD on 02/18/2010   Method used:   Print then Give to Patient   RxID:   [id number] XANAX 1 MG  TABS (ALPRAZOLAM) 1/2 -1 two times a day as needed  #60 x 6   Entered and Authorized by:   Tresa Garter MD   Signed by:   Tresa Garter MD on 02/18/2010   Method used:   Print then Give to Patient   RxID:   [id number] CIALIS 5 MG TABS (TADALAFIL) 1 by mouth qd  #30 x 6   Entered and Authorized by:   Tresa Garter MD   Signed by:   Tresa Garter MD on 02/18/2010   Method used:   Print then Give to Patient   RxID:   [id number]    Orders Added: 1)  Est. Patient Level IV [01027]

## 2010-04-01 ENCOUNTER — Telehealth: Payer: Self-pay | Admitting: Internal Medicine

## 2010-04-02 ENCOUNTER — Other Ambulatory Visit: Payer: Self-pay

## 2010-04-07 ENCOUNTER — Other Ambulatory Visit: Payer: BC Managed Care – PPO

## 2010-04-07 ENCOUNTER — Other Ambulatory Visit: Payer: Self-pay | Admitting: Internal Medicine

## 2010-04-07 ENCOUNTER — Encounter (INDEPENDENT_AMBULATORY_CARE_PROVIDER_SITE_OTHER): Payer: Self-pay | Admitting: *Deleted

## 2010-04-07 DIAGNOSIS — Z7901 Long term (current) use of anticoagulants: Secondary | ICD-10-CM

## 2010-04-07 LAB — PROTIME-INR
INR: 3.5 ratio — ABNORMAL HIGH (ref 0.8–1.0)
Prothrombin Time: 34.4 s — ABNORMAL HIGH (ref 10.2–12.4)

## 2010-04-08 ENCOUNTER — Telehealth: Payer: Self-pay | Admitting: *Deleted

## 2010-04-08 NOTE — Telephone Encounter (Signed)
Message copied by Lanier Prude on Tue Apr 08, 2010  8:25 AM ------      Message from: Sonda Primes      Created: Tue Apr 08, 2010  7:54 AM       Misty Stanley, pls ask if his oral surg is coming soon. Thx!

## 2010-04-08 NOTE — Telephone Encounter (Signed)
He can hold Coum today and tomorrow and his INR should be good by 3/22

## 2010-04-08 NOTE — Telephone Encounter (Signed)
Pt informed of lab results. Pt states he was scheduled for tooth extraction for Thursday 04/10/10 w/Dr Buel Ream.He is going to postpone appt; says it is not anything urgent. Pt states he is taking coumadin 10mg  QD; except for Monday & Friday which he is taking15mg . Pt states he has yet to take any dosage today; pending orders from you. Please Advise.

## 2010-04-08 NOTE — Progress Notes (Signed)
Summary: procedure authorization  Phone Note From Other Clinic   Caller: Dr. Buel Ream, DDS @ [phone] Call For: Med Inquiry Summary of Call: Dr. Darl Pikes states that Pt needs to be scheduled for tooth extraction and would like to know if Pt needs to be removed from Coumadin and for what length of time before procedure. Clearance for procedure and chart information requested to be faxed to [phone] Initial call taken by: Burnard Leigh [facility]),  April 01, 2010 10:55 AM  Follow-up for Phone Call        Can Dr Darl Pikes extract tooth if INR is <2.0? In that case we cont Coumadin and make sure INR is less than 2.0 prior to tooth extraction Follow-up by: Tresa Garter MD,  April 01, 2010 7:05 PM  Additional Follow-up for Phone Call Additional follow up Details #1::        Informed Dr. Darl Pikes of Dr. Loren Racer instructions; will call back when PT/INR results are in.   Pt informed that lab orders are in. Pt states he will be in tomorrow 04/03/10.  Additional Follow-up by: Burnard Leigh [facility]),  April 02, 2010 11:33 AM

## 2010-04-09 NOTE — Telephone Encounter (Signed)
Left mess for patient to call back.  

## 2010-04-09 NOTE — Telephone Encounter (Signed)
Pt advised of below...Marland Kitchenhe states he cancelled his dental procedure. I advised him to hold coumadin per Dr. Loren Racer advisement and have his INR rechecked next week.

## 2010-05-13 ENCOUNTER — Other Ambulatory Visit: Payer: Self-pay | Admitting: Internal Medicine

## 2010-05-13 ENCOUNTER — Other Ambulatory Visit: Payer: Self-pay

## 2010-05-13 DIAGNOSIS — Z7901 Long term (current) use of anticoagulants: Secondary | ICD-10-CM

## 2010-05-13 DIAGNOSIS — E038 Other specified hypothyroidism: Secondary | ICD-10-CM

## 2010-05-20 ENCOUNTER — Ambulatory Visit: Payer: Self-pay | Admitting: Internal Medicine

## 2010-05-26 ENCOUNTER — Ambulatory Visit: Payer: Self-pay | Admitting: Internal Medicine

## 2010-05-27 ENCOUNTER — Other Ambulatory Visit: Payer: Self-pay | Admitting: *Deleted

## 2010-05-27 DIAGNOSIS — N400 Enlarged prostate without lower urinary tract symptoms: Secondary | ICD-10-CM

## 2010-05-27 NOTE — Telephone Encounter (Signed)
Ok - see meds

## 2010-05-27 NOTE — Telephone Encounter (Signed)
PA requested for Cialis.  Pt's insurance [[employer] [place]] does not cover Cialis [except for the Dx of BPH] per PA representative.

## 2010-05-28 NOTE — Telephone Encounter (Signed)
Pt cannot get Cialis for Dx of Erectile Dysfunction. Insurance will only authorize Cialis for Dx of Benign Prostatic Hypertrophy. Please advise if alternative medication can be prescribed.

## 2010-06-03 DIAGNOSIS — N400 Enlarged prostate without lower urinary tract symptoms: Secondary | ICD-10-CM | POA: Insufficient documentation

## 2010-06-03 NOTE — Telephone Encounter (Signed)
Joseph [name], What do we do next?

## 2010-06-03 NOTE — Telephone Encounter (Signed)
Ok BPH Thx

## 2010-06-04 ENCOUNTER — Other Ambulatory Visit: Payer: Self-pay | Admitting: *Deleted

## 2010-06-04 DIAGNOSIS — N400 Enlarged prostate without lower urinary tract symptoms: Secondary | ICD-10-CM

## 2010-06-04 MED ORDER — TADALAFIL 20 MG PO TABS: 20.0000 mg | ORAL_TABLET | Freq: Every day | ORAL | Status: DC | PRN

## 2010-06-04 NOTE — Telephone Encounter (Signed)
New Rx escribed for Cialis Dx: BPH per Dr Posey Rea.

## 2010-06-09 ENCOUNTER — Other Ambulatory Visit (INDEPENDENT_AMBULATORY_CARE_PROVIDER_SITE_OTHER): Payer: BC Managed Care – PPO

## 2010-06-09 DIAGNOSIS — Z7901 Long term (current) use of anticoagulants: Secondary | ICD-10-CM

## 2010-06-09 DIAGNOSIS — E038 Other specified hypothyroidism: Secondary | ICD-10-CM

## 2010-06-09 LAB — TSH: TSH: 1.16 u[IU]/mL (ref 0.35–5.50)

## 2010-06-09 LAB — BASIC METABOLIC PANEL
BUN: 14 mg/dL (ref 6–23)
CO2: 26 mEq/L (ref 19–32)
Calcium: 8.8 mg/dL (ref 8.4–10.5)
Chloride: 103 mEq/L (ref 96–112)
Creatinine, Ser: 1.1 mg/dL (ref 0.4–1.5)
GFR: 76.62 mL/min (ref >60.00)
Glucose, Bld: 124 mg/dL — ABNORMAL HIGH (ref 70–99)
Potassium: 4.2 mEq/L (ref 3.5–5.1)
Sodium: 137 mEq/L (ref 135–145)

## 2010-06-09 LAB — PROTIME-INR
INR: 2 ratio — ABNORMAL HIGH (ref 0.8–1.0)
Prothrombin Time: 20.6 s — ABNORMAL HIGH (ref 10.2–12.4)

## 2010-06-10 ENCOUNTER — Telehealth: Payer: Self-pay | Admitting: *Deleted

## 2010-06-10 DIAGNOSIS — Z7901 Long term (current) use of anticoagulants: Secondary | ICD-10-CM

## 2010-06-10 DIAGNOSIS — E291 Testicular hypofunction: Secondary | ICD-10-CM

## 2010-06-10 DIAGNOSIS — R739 Hyperglycemia, unspecified: Secondary | ICD-10-CM

## 2010-06-10 NOTE — Telephone Encounter (Signed)
Pt is scheduled for tooth extraction this Thursday. Last INR was 2.0 on 5/21 ( He had missed a dose recently and not taken med the day of lab visit.   Per last phone message which addressed same issue -  I told pt to hold coumadin today, tomorrow and Thursday. OK? When does he resume coumadin again?   ALSO - pt is scheduled for OV next week and would like labs prior.

## 2010-06-10 NOTE — Telephone Encounter (Signed)
Patient informed. 

## 2010-06-10 NOTE — Telephone Encounter (Signed)
Agree. Restart Coumadin next day post extraction. Labs entered Thx

## 2010-06-17 ENCOUNTER — Other Ambulatory Visit (INDEPENDENT_AMBULATORY_CARE_PROVIDER_SITE_OTHER): Payer: BC Managed Care – PPO

## 2010-06-17 ENCOUNTER — Ambulatory Visit (INDEPENDENT_AMBULATORY_CARE_PROVIDER_SITE_OTHER): Payer: BC Managed Care – PPO | Admitting: Internal Medicine

## 2010-06-17 ENCOUNTER — Encounter: Payer: Self-pay | Admitting: Internal Medicine

## 2010-06-17 VITALS — BP 142/78 | HR 76 | Temp 98.7°F | Resp 16 | Ht 75.0 in | Wt 289.0 lb

## 2010-06-17 DIAGNOSIS — R635 Abnormal weight gain: Secondary | ICD-10-CM

## 2010-06-17 DIAGNOSIS — R739 Hyperglycemia, unspecified: Secondary | ICD-10-CM

## 2010-06-17 DIAGNOSIS — R7309 Other abnormal glucose: Secondary | ICD-10-CM

## 2010-06-17 DIAGNOSIS — E291 Testicular hypofunction: Secondary | ICD-10-CM

## 2010-06-17 DIAGNOSIS — Z7901 Long term (current) use of anticoagulants: Secondary | ICD-10-CM

## 2010-06-17 LAB — PROTIME-INR
INR: 2.2 ratio — ABNORMAL HIGH (ref 0.8–1.0)
Prothrombin Time: 22.4 s — ABNORMAL HIGH (ref 10.2–12.4)

## 2010-06-17 LAB — CBC WITH DIFFERENTIAL/PLATELET
Basophils Absolute: 0 10*3/uL (ref 0.0–0.1)
Basophils Relative: 0.4 % (ref 0.0–3.0)
Eosinophils Absolute: 0.1 10*3/uL (ref 0.0–0.7)
Eosinophils Relative: 1.7 % (ref 0.0–5.0)
HCT: 39.7 % (ref 39.0–52.0)
Hemoglobin: 13.9 g/dL (ref 13.0–17.0)
Lymphocytes Relative: 25.9 % (ref 12.0–46.0)
Lymphs Abs: 2.2 10*3/uL (ref 0.7–4.0)
MCHC: 35.2 g/dL (ref 30.0–36.0)
MCV: 97.8 fl (ref 78.0–100.0)
Monocytes Absolute: 0.6 10*3/uL (ref 0.1–1.0)
Monocytes Relative: 6.7 % (ref 3.0–12.0)
Neutro Abs: 5.4 10*3/uL (ref 1.4–7.7)
Neutrophils Relative %: 65.3 % (ref 43.0–77.0)
Platelets: 302 10*3/uL (ref 150.0–400.0)
RBC: 4.06 Mil/uL — ABNORMAL LOW (ref 4.22–5.81)
RDW: 13.1 % (ref 11.5–14.6)
WBC: 8.3 10*3/uL (ref 4.5–10.5)

## 2010-06-17 LAB — COMPREHENSIVE METABOLIC PANEL
ALT: 17 U/L (ref 0–53)
AST: 18 U/L (ref 0–37)
Albumin: 3.9 g/dL (ref 3.5–5.2)
Alkaline Phosphatase: 57 U/L (ref 39–117)
BUN: 19 mg/dL (ref 6–23)
CO2: 24 mEq/L (ref 19–32)
Calcium: 8.7 mg/dL (ref 8.4–10.5)
Chloride: 108 mEq/L (ref 96–112)
Creatinine, Ser: 1.1 mg/dL (ref 0.4–1.5)
GFR: 74.96 mL/min (ref >60.00)
Glucose, Bld: 95 mg/dL (ref 70–99)
Potassium: 4 mEq/L (ref 3.5–5.1)
Sodium: 140 mEq/L (ref 135–145)
Total Bilirubin: 0.3 mg/dL (ref 0.3–1.2)
Total Protein: 6.9 g/dL (ref 6.0–8.3)

## 2010-06-17 LAB — HEMOGLOBIN A1C: Hgb A1c MFr Bld: 6.1 % (ref 4.6–6.5)

## 2010-06-17 MED ORDER — TADALAFIL 5 MG PO TABS: 5.0000 mg | ORAL_TABLET | Freq: Every day | ORAL | Status: DC | PRN

## 2010-06-17 MED ORDER — SILDENAFIL CITRATE 100 MG PO TABS: 100.0000 mg | ORAL_TABLET | ORAL | Status: DC | PRN

## 2010-06-17 MED ORDER — ALPRAZOLAM 1 MG PO TABS: 0.5000 mg | ORAL_TABLET | Freq: Two times a day (BID) | ORAL | Status: DC | PRN

## 2010-06-17 NOTE — Progress Notes (Signed)
  Subjective:    Patient ID: Joseph [name], male    DOB: 18-Aug-1950, 60 y.o.   MRN: [id number]  HPI  The patient presents for a follow-up of  chronic hypertension, chronic dyslipidemia, venous insufficiency controlled with medicines  F/u ED     Review of Systems Wt Readings from Last 3 Encounters:  06/17/10 289 lb (131.09 kg)  02/18/10 283 lb (128.368 kg)  11/12/09 284 lb (128.822 kg)       Objective:   Physical Exam  Constitutional: He is oriented to person, place, and time. He appears well-developed.       Obese  HENT:  Mouth/Throat: Oropharynx is clear and moist.  Eyes: Conjunctivae are normal. Pupils are equal, round, and reactive to light.  Neck: Normal range of motion. No JVD present. No thyromegaly present.  Cardiovascular: Normal rate, regular rhythm, normal heart sounds and intact distal pulses.  Exam reveals no gallop and no friction rub.   No murmur heard. Pulmonary/Chest: Effort normal and breath sounds normal. No respiratory distress. He has no wheezes. He has no rales. He exhibits no tenderness.  Abdominal: Soft. Bowel sounds are normal. He exhibits no distension and no mass. There is no tenderness. There is no rebound and no guarding.  Musculoskeletal: Normal range of motion. He exhibits no edema and no tenderness.  Lymphadenopathy:    He has no cervical adenopathy.  Neurological: He is alert and oriented to person, place, and time. He has normal reflexes. No cranial nerve deficit. He exhibits normal muscle tone. Coordination normal.  Skin: Skin is warm and dry. No rash noted.  Psychiatric: He has a normal mood and affect. His behavior is normal. Judgment and thought content normal.       Lab Results  Component Value Date   WBC 8.3 06/17/2010   HGB 13.9 06/17/2010   HCT 39.7 06/17/2010   PLT 302.0 06/17/2010   CHOL 140 11/12/2009   TRIG 100.0 11/12/2009   HDL 41.10 11/12/2009   ALT 17 06/17/2010   AST 18 06/17/2010   NA 140 06/17/2010   K 4.0 06/17/2010   CL 108 06/17/2010   CREATININE 1.1 06/17/2010   BUN 19 06/17/2010   CO2 24 06/17/2010   TSH 1.46 06/17/2010   PSA 1.78 11/12/2009   INR 2.2* 06/17/2010   HGBA1C 6.1 06/17/2010     Assessment & Plan:  ED   Options discussed again and Rx's were given  Obesity  He is trying to loose wt  Anticoagulation   On Rx. Check labs  Post-DVT syndrome w/LE swelling  Compr socks  Anxiety  On Rx

## 2010-06-18 ENCOUNTER — Telehealth: Payer: Self-pay | Admitting: Internal Medicine

## 2010-06-18 LAB — TSH: TSH: 1.46 u[IU]/mL (ref 0.35–5.50)

## 2010-06-18 LAB — TESTOSTERONE: Testosterone: 288.59 ng/dL — ABNORMAL LOW (ref 350.00–890.00)

## 2010-06-18 NOTE — Telephone Encounter (Signed)
Left detailed mess informing pt of below/copies mailed.

## 2010-06-18 NOTE — Telephone Encounter (Signed)
Message copied by Janeal Holmes on Wed Jun 18, 2010 10:38 PM ------      Message from: Vernie Murders      Created: Wed Jun 18, 2010 11:50 AM                   ----- Message -----         From: Lab In [place] Interface         Sent: 06/18/2010   9:52 AM           To: Lamar Sprinkles, CMA

## 2010-06-18 NOTE — Telephone Encounter (Signed)
Message copied by Janeal Holmes on Wed Jun 18, 2010  1:34 PM ------      Message from: Vernie Murders      Created: Wed Jun 18, 2010 11:50 AM                   ----- Message -----         From: Lab In [place] Interface         Sent: 06/18/2010   9:52 AM           To: Lamar Sprinkles, CMA

## 2010-06-18 NOTE — Telephone Encounter (Signed)
Please, mail the labs to the patient.     

## 2010-06-18 NOTE — Telephone Encounter (Signed)
Joseph [name], please, inform patient that all labs are OK except for low testost  Please, mail the labs to the patient.

## 2010-06-19 ENCOUNTER — Encounter: Payer: Self-pay | Admitting: Internal Medicine

## 2010-06-19 NOTE — Telephone Encounter (Signed)
done

## 2010-08-26 ENCOUNTER — Other Ambulatory Visit (INDEPENDENT_AMBULATORY_CARE_PROVIDER_SITE_OTHER): Payer: BC Managed Care – PPO

## 2010-08-26 ENCOUNTER — Ambulatory Visit (INDEPENDENT_AMBULATORY_CARE_PROVIDER_SITE_OTHER): Payer: BC Managed Care – PPO | Admitting: Internal Medicine

## 2010-08-26 ENCOUNTER — Encounter: Payer: Self-pay | Admitting: Internal Medicine

## 2010-08-26 DIAGNOSIS — I872 Venous insufficiency (chronic) (peripheral): Secondary | ICD-10-CM

## 2010-08-26 DIAGNOSIS — E669 Obesity, unspecified: Secondary | ICD-10-CM

## 2010-08-26 DIAGNOSIS — F411 Generalized anxiety disorder: Secondary | ICD-10-CM

## 2010-08-26 DIAGNOSIS — Z86718 Personal history of other venous thrombosis and embolism: Secondary | ICD-10-CM

## 2010-08-26 DIAGNOSIS — R609 Edema, unspecified: Secondary | ICD-10-CM

## 2010-08-26 DIAGNOSIS — R635 Abnormal weight gain: Secondary | ICD-10-CM

## 2010-08-26 LAB — COMPREHENSIVE METABOLIC PANEL
ALT: 22 U/L (ref 0–53)
AST: 21 U/L (ref 0–37)
Albumin: 4.1 g/dL (ref 3.5–5.2)
Alkaline Phosphatase: 62 U/L (ref 39–117)
BUN: 17 mg/dL (ref 6–23)
CO2: 25 mEq/L (ref 19–32)
Calcium: 8.8 mg/dL (ref 8.4–10.5)
Chloride: 105 mEq/L (ref 96–112)
Creatinine, Ser: 1.1 mg/dL (ref 0.4–1.5)
GFR: 72.56 mL/min (ref >60.00)
Glucose, Bld: 98 mg/dL (ref 70–99)
Potassium: 4.2 mEq/L (ref 3.5–5.1)
Sodium: 139 mEq/L (ref 135–145)
Total Bilirubin: 0.8 mg/dL (ref 0.3–1.2)
Total Protein: 6.9 g/dL (ref 6.0–8.3)

## 2010-08-26 MED ORDER — WARFARIN SODIUM 10 MG PO TABS: 10.0000 mg | ORAL_TABLET | ORAL | Status: DC

## 2010-08-26 MED ORDER — LEVOTHYROXINE SODIUM 25 MCG PO TABS: 25.0000 ug | ORAL_TABLET | Freq: Every day | ORAL | Status: DC

## 2010-08-26 NOTE — Assessment & Plan Note (Signed)
Cont. Coumadin  

## 2010-08-26 NOTE — Assessment & Plan Note (Signed)
On Rx 

## 2010-08-26 NOTE — Progress Notes (Signed)
  Subjective:    Patient ID: Joseph [name], male    DOB: [birth year], 60 y.o.   MRN: [id number]  HPI   The patient is here to follow up on chronic DVT, anxiety, headaches and chronic moderate fibromyalgia symptoms controlled with medicines, diet and exercise.   Review of Systems Wt Readings from Last 3 Encounters:  08/26/10 288 lb (130.636 kg)  06/17/10 289 lb (131.09 kg)  02/18/10 283 lb (128.368 kg)       Objective:   Physical Exam  Constitutional: He is oriented to person, place, and time. He appears well-developed.       Obese  HENT:  Mouth/Throat: Oropharynx is clear and moist.  Eyes: Conjunctivae are normal. Pupils are equal, round, and reactive to light.  Neck: Normal range of motion. No JVD present. No thyromegaly present.  Cardiovascular: Normal rate, regular rhythm, normal heart sounds and intact distal pulses.  Exam reveals no gallop and no friction rub.   No murmur heard. Pulmonary/Chest: Effort normal and breath sounds normal. No respiratory distress. He has no wheezes. He has no rales. He exhibits no tenderness.  Abdominal: Soft. Bowel sounds are normal. He exhibits no distension and no mass. There is no tenderness. There is no rebound and no guarding.  Musculoskeletal: Normal range of motion. He exhibits edema (R LE). He exhibits no tenderness.       Trace to 1+ R LE  Lymphadenopathy:    He has no cervical adenopathy.  Neurological: He is alert and oriented to person, place, and time. He has normal reflexes. No cranial nerve deficit. He exhibits normal muscle tone. Coordination normal.  Skin: Skin is warm and dry. No rash noted.  Psychiatric: He has a normal mood and affect. His behavior is normal. Judgment and thought content normal.          Assessment & Plan:

## 2010-08-26 NOTE — Assessment & Plan Note (Signed)
Wt loss Sock Elevation 

## 2010-08-26 NOTE — Assessment & Plan Note (Signed)
Post-DVT

## 2010-11-26 ENCOUNTER — Ambulatory Visit (INDEPENDENT_AMBULATORY_CARE_PROVIDER_SITE_OTHER): Payer: BC Managed Care – PPO | Admitting: Internal Medicine

## 2010-11-26 ENCOUNTER — Encounter: Payer: Self-pay | Admitting: Internal Medicine

## 2010-11-26 ENCOUNTER — Telehealth: Payer: Self-pay | Admitting: Internal Medicine

## 2010-11-26 ENCOUNTER — Other Ambulatory Visit (INDEPENDENT_AMBULATORY_CARE_PROVIDER_SITE_OTHER): Payer: BC Managed Care – PPO

## 2010-11-26 VITALS — BP 138/90 | HR 80 | Temp 99.1°F | Resp 16 | Wt 289.0 lb

## 2010-11-26 DIAGNOSIS — E039 Hypothyroidism, unspecified: Secondary | ICD-10-CM

## 2010-11-26 DIAGNOSIS — N400 Enlarged prostate without lower urinary tract symptoms: Secondary | ICD-10-CM

## 2010-11-26 DIAGNOSIS — E668 Other obesity: Secondary | ICD-10-CM

## 2010-11-26 DIAGNOSIS — E291 Testicular hypofunction: Secondary | ICD-10-CM

## 2010-11-26 DIAGNOSIS — E349 Endocrine disorder, unspecified: Secondary | ICD-10-CM

## 2010-11-26 DIAGNOSIS — R609 Edema, unspecified: Secondary | ICD-10-CM

## 2010-11-26 DIAGNOSIS — E669 Obesity, unspecified: Secondary | ICD-10-CM

## 2010-11-26 LAB — BASIC METABOLIC PANEL
BUN: 20 mg/dL (ref 6–23)
CO2: 22 mEq/L (ref 19–32)
Calcium: 9.1 mg/dL (ref 8.4–10.5)
Chloride: 106 mEq/L (ref 96–112)
Creatinine, Ser: 1 mg/dL (ref 0.4–1.5)
GFR: 80.93 mL/min (ref >60.00)
Glucose, Bld: 102 mg/dL — ABNORMAL HIGH (ref 70–99)
Potassium: 4.1 mEq/L (ref 3.5–5.1)
Sodium: 137 mEq/L (ref 135–145)

## 2010-11-26 LAB — PROTIME-INR
INR: 2.2 ratio — ABNORMAL HIGH (ref 0.8–1.0)
Prothrombin Time: 24.5 s — ABNORMAL HIGH (ref 10.2–12.4)

## 2010-11-26 LAB — FOLLICLE STIMULATING HORMONE: FSH: 21.1 m[IU]/mL — ABNORMAL HIGH (ref 1.4–18.1)

## 2010-11-26 LAB — LUTEINIZING HORMONE: LH: 4.12 m[IU]/mL (ref 1.50–9.30)

## 2010-11-26 LAB — TESTOSTERONE: Testosterone: 279.21 ng/dL — ABNORMAL LOW (ref 350.00–890.00)

## 2010-11-26 MED ORDER — LEVOTHYROXINE SODIUM 25 MCG PO TABS: 25.0000 ug | ORAL_TABLET | Freq: Every day | ORAL | Status: DC

## 2010-11-26 MED ORDER — WARFARIN SODIUM 10 MG PO TABS: 10.0000 mg | ORAL_TABLET | ORAL | Status: DC

## 2010-11-26 MED ORDER — ALPRAZOLAM 1 MG PO TABS: 0.5000 mg | ORAL_TABLET | Freq: Two times a day (BID) | ORAL | Status: DC | PRN

## 2010-11-26 MED ORDER — TADALAFIL 5 MG PO TABS: 5.0000 mg | ORAL_TABLET | Freq: Every day | ORAL | Status: DC | PRN

## 2010-11-26 MED ORDER — TESTOSTERONE 10 MG/ACT (2%) TD GEL: 2.0000 | TRANSDERMAL | Status: DC

## 2010-11-26 NOTE — Assessment & Plan Note (Signed)
Continue with current prescription therapy as reflected on the Med list.  

## 2010-11-26 NOTE — Progress Notes (Signed)
  Subjective:    Patient ID: Joseph [name], male    DOB: 1950-02-02, 60 y.o.   MRN: [id number]  HPI   The patient is here to follow up on chronic ED, anxiety, DVT and chronic moderate fibromyalgia symptoms controlled with medicines, diet and exercise.  Wt Readings from Last 3 Encounters:  11/26/10 289 lb (131.09 kg)  08/26/10 288 lb (130.636 kg)  06/17/10 289 lb (131.09 kg)       Review of Systems  Constitutional: Negative for appetite change, fatigue and unexpected weight change.  HENT: Negative for nosebleeds, congestion, sore throat, sneezing, trouble swallowing and neck pain.   Eyes: Negative for itching and visual disturbance.  Respiratory: Negative for cough.   Cardiovascular: Negative for chest pain, palpitations and leg swelling.  Gastrointestinal: Negative for nausea, diarrhea, blood in stool and abdominal distention.  Genitourinary: Negative for frequency and hematuria.  Musculoskeletal: Negative for back pain, joint swelling and gait problem.  Skin: Negative for rash.  Neurological: Negative for dizziness, tremors, speech difficulty and weakness.  Psychiatric/Behavioral: Negative for sleep disturbance, dysphoric mood and agitation. The patient is not nervous/anxious.        Objective:   Physical Exam  Constitutional: He is oriented to person, place, and time. He appears well-developed.       obese  HENT:  Mouth/Throat: Oropharynx is clear and moist.  Eyes: Conjunctivae are normal. Pupils are equal, round, and reactive to light.  Neck: Normal range of motion. No JVD present. No thyromegaly present.  Cardiovascular: Normal rate, regular rhythm, normal heart sounds and intact distal pulses.  Exam reveals no gallop and no friction rub.   No murmur heard. Pulmonary/Chest: Effort normal and breath sounds normal. No respiratory distress. He has no wheezes. He has no rales. He exhibits no tenderness.  Abdominal: Soft. Bowel sounds are normal. He exhibits no distension  and no mass. There is no tenderness. There is no rebound and no guarding.  Musculoskeletal: Normal range of motion. He exhibits edema. He exhibits no tenderness.  Lymphadenopathy:    He has no cervical adenopathy.  Neurological: He is alert and oriented to person, place, and time. He has normal reflexes. No cranial nerve deficit. He exhibits normal muscle tone. Coordination normal.  Skin: Skin is warm and dry. No rash noted.  Psychiatric: He has a normal mood and affect. His behavior is normal. Judgment and thought content normal.          Assessment & Plan:

## 2010-11-26 NOTE — Telephone Encounter (Signed)
Joseph [name], please, inform patient that all labs are normal except for low testost. OK to start the gel. INR is good. Coum - same dose Thx

## 2010-11-26 NOTE — Assessment & Plan Note (Signed)
Discussed.

## 2010-11-27 DIAGNOSIS — E291 Testicular hypofunction: Secondary | ICD-10-CM | POA: Insufficient documentation

## 2010-11-27 NOTE — Telephone Encounter (Signed)
Pt informed

## 2010-11-27 NOTE — Assessment & Plan Note (Signed)
Options to treat were discussed

## 2010-12-15 ENCOUNTER — Telehealth: Payer: Self-pay | Admitting: *Deleted

## 2010-12-15 NOTE — Telephone Encounter (Signed)
Caller left vm stating pt needs root canal. She needs to know if pt can hold his coumadin and for how many days?

## 2010-12-15 NOTE — Telephone Encounter (Signed)
Hold Coum x 2 d prior. They can do root canal w/INR<2.0. Restart next day Thx

## 2010-12-16 NOTE — Telephone Encounter (Signed)
August informed/ Left mess for patient to call back.

## 2010-12-17 NOTE — Telephone Encounter (Signed)
Informed patient that it was ok to hold his coumadin 2 days prior to his root canal and restart next day.

## 2010-12-23 ENCOUNTER — Telehealth: Payer: Self-pay | Admitting: Gastroenterology

## 2010-12-23 NOTE — Telephone Encounter (Signed)
Dr. Arlyce Dice this pt wants to have his colon done before the end of the year and he is on coumadin. Is it ok to schedule him with another provider for his colon? Please advise.

## 2010-12-24 ENCOUNTER — Other Ambulatory Visit: Payer: Self-pay | Admitting: Gastroenterology

## 2010-12-24 MED ORDER — PEG-KCL-NACL-NASULF-NA ASC-C 100 G PO SOLR: 1.0000 | Freq: Once | ORAL | Status: DC

## 2010-12-24 NOTE — Telephone Encounter (Signed)
Pt scheduled for colon @[facility]  arrival time 9:30am, procedure at 10:30am. Pt stopping his coumadin on for a root canal earlier that week. Have sent note to Dr. Posey Rea requesting further instruction for coumadin. Pt aware of appt date and time, instructions mailed to pt. Rx sent to pharmacy. AVWU#[id number].

## 2010-12-24 NOTE — Telephone Encounter (Signed)
Schedule him for next week

## 2011-01-02 ENCOUNTER — Encounter (HOSPITAL_COMMUNITY): Payer: Self-pay | Admitting: *Deleted

## 2011-01-02 ENCOUNTER — Encounter (HOSPITAL_COMMUNITY)
Admission: RE | Disposition: A | Discharge status: Home / Self Care | Payer: Self-pay | Source: Ambulatory Visit | Attending: Gastroenterology

## 2011-01-02 ENCOUNTER — Ambulatory Visit (HOSPITAL_COMMUNITY)
Admission: RE | Admit: 2011-01-02 | Discharge: 2011-01-02 | Disposition: A | Discharge status: Home / Self Care | Payer: BC Managed Care – PPO | Source: Ambulatory Visit | Attending: Gastroenterology | Admitting: Gastroenterology

## 2011-01-02 ENCOUNTER — Other Ambulatory Visit: Payer: Self-pay | Admitting: Gastroenterology

## 2011-01-02 DIAGNOSIS — Z860100 Personal history of colon polyps, unspecified: Secondary | ICD-10-CM | POA: Insufficient documentation

## 2011-01-02 DIAGNOSIS — D126 Benign neoplasm of colon, unspecified: Secondary | ICD-10-CM | POA: Insufficient documentation

## 2011-01-02 DIAGNOSIS — K5730 Diverticulosis of large intestine without perforation or abscess without bleeding: Secondary | ICD-10-CM | POA: Insufficient documentation

## 2011-01-02 HISTORY — PX: COLONOSCOPY: SHX5424

## 2011-01-02 SURGERY — COLONOSCOPY
Anesthesia: Moderate Sedation

## 2011-01-02 MED ORDER — FENTANYL CITRATE 0.05 MG/ML IJ SOLN
INTRAMUSCULAR | Status: AC
  Filled 2011-01-02: qty 2

## 2011-01-02 MED ORDER — WARFARIN SODIUM 10 MG PO TABS: 10.0000 mg | ORAL_TABLET | ORAL | Status: DC

## 2011-01-02 MED ORDER — SPOT INK MARKER SYRINGE KIT
PACK | SUBMUCOSAL | Status: AC
  Filled 2011-01-02: qty 5

## 2011-01-02 MED ORDER — MIDAZOLAM HCL 10 MG/2ML IJ SOLN
INTRAMUSCULAR | Status: AC
  Filled 2011-01-02: qty 2

## 2011-01-02 MED ORDER — SPOT INK MARKER SYRINGE KIT
PACK | SUBMUCOSAL | Status: DC | PRN
  Administered 2011-01-02: 2 mL via SUBMUCOSAL

## 2011-01-02 MED ORDER — SODIUM CHLORIDE 0.9 % IV SOLN
Freq: Once | INTRAVENOUS | Status: AC
  Administered 2011-01-02: 500 mL via INTRAVENOUS

## 2011-01-02 MED ORDER — MIDAZOLAM HCL 5 MG/5ML IJ SOLN
INTRAMUSCULAR | Status: DC | PRN
  Administered 2011-01-02 (×2): 1 mg via INTRAVENOUS
  Administered 2011-01-02: 2 mg via INTRAVENOUS
  Administered 2011-01-02: 1 mg via INTRAVENOUS
  Administered 2011-01-02: 2 mg via INTRAVENOUS

## 2011-01-02 MED ORDER — FENTANYL NICU IV SYRINGE 50 MCG/ML
INJECTION | INTRAMUSCULAR | Status: DC | PRN
  Administered 2011-01-02 (×2): 10 ug via INTRAVENOUS
  Administered 2011-01-02 (×2): 25 ug via INTRAVENOUS
  Administered 2011-01-02: 10 ug via INTRAVENOUS

## 2011-01-02 NOTE — Op Note (Signed)
[facility] [address] [place], [place]  [address]  COLONOSCOPY PROCEDURE REPORT  PATIENT:  Joseph [name], Joseph [name]  MR#:  [id number] BIRTHDATE:  06/29/1950, 60 yrs. old  GENDER:  male ENDOSCOPIST:  Eliazar Olivar. Arlyce Dice, MD REF. BY:  Linda Hedges. Plotnikov, M.D. PROCEDURE DATE:  01/02/2011 PROCEDURE:  Colonoscopy with polypectomy and submucosal injection, Colonoscopy with snare polypectomy ASA CLASS:  Class II INDICATIONS:  Screening, history of pre-cancerous (adenomatous) colon polyps index polypectomy 2005 MEDICATIONS:   These medications were titrated to patient response per physician's verbal order, Fentanyl 80 mcg IV, Versed 7 mg IV  DESCRIPTION OF PROCEDURE:   After the risks benefits and alternatives of the procedure were thoroughly explained, informed consent was obtained.  Digital rectal exam was performed and revealed no abnormalities.   The Pentax Colonoscope [id number] endoscope was introduced through the anus and advanced to the cecum, which was identified by both the appendix and ileocecal valve, without limitations.  The quality of the prep was good, using MoviPrep.  The instrument was then slowly withdrawn as the colon was fully examined. <<PROCEDUREIMAGES>>  FINDINGS:  A sessile polyp was found in the descending colon. It was 3 mm in size. Polyp was snared without cautery. Retrieval was successful (see image6). snare polyp  A sessile polyp was found. left, sessile polyp distal descending colon Polyp was snared, then cauterized with monopolar cautery. Retrieval was successful (see image7 and image9). snare polyp 2 cc of "spot" were injected submucosally in the area of the polyp  A diverticulum was found in the mid transverse colon (see image1). a rare diverticulum was seen in the transverse colon  This was otherwise a normal examination of the colon (see image3 and image10).   Retroflexed views in the rectum revealed no abnormalities.    The time to cecum =  minutes.  The scope was then withdrawn in  1) 19.0 minutes from the cecum and the procedure completed. 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 Otherwise you should continue to follow colorectal cancer screening guidelines for "routine risk" patients with colonoscopy in 10 years. REPEAT EXAM:   You will receive a letter from Dr. Arlyce Dice in 1-2 weeks, after reviewing the final pathology, with followup recommendations.  ______________________________ Barbette Hair Arlyce Dice, MD  CC:  n. eSIGNED:   Barbette Hair. Kaplan at 01/02/2011 11:37 AM  Sharen Heck, [phone]

## 2011-01-02 NOTE — H&P (Signed)
  History of Present Illness:  The patient has a history of adenomatous polyps and was last examined in 2005. He has no ongoing GI complaints.    Review of Systems: Pertinent positive and negative review of systems were noted in the above HPI section. All other review of systems were otherwise negative.    Current Medications, Allergies, Past Medical History, Past Surgical History, Family History and Social History were reviewed in [facility] electronic medical record  Vital signs were reviewed in today's medical record. Physical Exam: General: Well developed , well nourished, no acute distress Head: Normocephalic and atraumatic Eyes:  sclerae anicteric, EOMI Ears: Normal auditory acuity Mouth: No deformity or lesions Lungs: Clear throughout to auscultation Heart: Regular rate and rhythm; no murmurs, rubs or bruits Abdomen: Soft, non tender and non distended. No masses, hepatosplenomegaly or hernias noted. Normal Bowel sounds Rectal:deferred Musculoskeletal: Symmetrical with no gross deformities  Pulses:  Normal pulses noted Extremities: No clubbing, cyanosis, edema or deformities noted Neurological: Alert oriented x 4, grossly nonfocal Psychological:  Alert and cooperative. Normal mood and affect  Impression  #1 history of colon polyps  Recommendations #1 followup colonoscopy. Coumadin will be held and advanced procedure.

## 2011-01-05 ENCOUNTER — Encounter (HOSPITAL_COMMUNITY): Payer: Self-pay

## 2011-01-05 ENCOUNTER — Encounter (HOSPITAL_COMMUNITY): Payer: Self-pay | Admitting: Gastroenterology

## 2011-03-18 ENCOUNTER — Ambulatory Visit (INDEPENDENT_AMBULATORY_CARE_PROVIDER_SITE_OTHER): Payer: PRIVATE HEALTH INSURANCE | Admitting: Internal Medicine

## 2011-03-18 ENCOUNTER — Encounter: Payer: Self-pay | Admitting: Internal Medicine

## 2011-03-18 ENCOUNTER — Other Ambulatory Visit (INDEPENDENT_AMBULATORY_CARE_PROVIDER_SITE_OTHER): Payer: PRIVATE HEALTH INSURANCE

## 2011-03-18 DIAGNOSIS — R413 Other amnesia: Secondary | ICD-10-CM

## 2011-03-18 DIAGNOSIS — E291 Testicular hypofunction: Secondary | ICD-10-CM

## 2011-03-18 DIAGNOSIS — F411 Generalized anxiety disorder: Secondary | ICD-10-CM

## 2011-03-18 DIAGNOSIS — R030 Elevated blood-pressure reading, without diagnosis of hypertension: Secondary | ICD-10-CM

## 2011-03-18 DIAGNOSIS — Z23 Encounter for immunization: Secondary | ICD-10-CM

## 2011-03-18 DIAGNOSIS — Z86718 Personal history of other venous thrombosis and embolism: Secondary | ICD-10-CM

## 2011-03-18 DIAGNOSIS — F440 Dissociative amnesia: Secondary | ICD-10-CM

## 2011-03-18 DIAGNOSIS — R609 Edema, unspecified: Secondary | ICD-10-CM

## 2011-03-18 LAB — BASIC METABOLIC PANEL
BUN: 20 mg/dL (ref 6–23)
CO2: 24 mEq/L (ref 19–32)
Calcium: 9.3 mg/dL (ref 8.4–10.5)
Chloride: 107 mEq/L (ref 96–112)
Creatinine, Ser: 1.1 mg/dL (ref 0.4–1.5)
GFR: 76.42 mL/min (ref >60.00)
Glucose, Bld: 96 mg/dL (ref 70–99)
Potassium: 4.4 mEq/L (ref 3.5–5.1)
Sodium: 138 mEq/L (ref 135–145)

## 2011-03-18 LAB — PROTIME-INR
INR: 3.1 ratio — ABNORMAL HIGH (ref 0.8–1.0)
Prothrombin Time: 34.6 s — ABNORMAL HIGH (ref 10.2–12.4)

## 2011-03-18 MED ORDER — TESTOSTERONE 10 MG/ACT (2%) TD GEL: 2.0000 | TRANSDERMAL | Status: DC

## 2011-03-18 MED ORDER — TADALAFIL 5 MG PO TABS: 5.0000 mg | ORAL_TABLET | Freq: Every day | ORAL | Status: DC | PRN

## 2011-03-18 MED ORDER — TADALAFIL 20 MG PO TABS: 20.0000 mg | ORAL_TABLET | Freq: Every day | ORAL | Status: DC | PRN

## 2011-03-18 MED ORDER — ALPRAZOLAM 1 MG PO TABS: 0.5000 mg | ORAL_TABLET | Freq: Two times a day (BID) | ORAL | Status: DC | PRN

## 2011-03-18 NOTE — Progress Notes (Signed)
Patient ID: Joseph [name], male   DOB: 10-24-1950, 61 y.o.   MRN: [id number]  Subjective:    Patient ID: Joseph [name], male    DOB: [birth year], 61 y.o.   MRN: [id number]  HPI   He moved to [place], [place]  The patient is here to follow up on chronic ED, anxiety, DVT and chronic moderate fibromyalgia symptoms controlled with medicines, diet and exercise.  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 No problems w/cialis  Wt Readings from Last 3 Encounters:  03/18/11 292 lb (132.45 kg)  01/02/11 275 lb (124.739 kg)  01/02/11 275 lb (124.739 kg)   BP Readings from Last 3 Encounters:  03/18/11 120/80  01/02/11 116/74  01/02/11 116/74       Review of Systems  Constitutional: Negative for appetite change, fatigue and unexpected weight change.  HENT: Negative for nosebleeds, congestion, sore throat, sneezing, trouble swallowing and neck pain.   Eyes: Negative for itching and visual disturbance.  Respiratory: Negative for cough.   Cardiovascular: Negative for chest pain, palpitations and leg swelling.  Gastrointestinal: Negative for nausea, diarrhea, blood in stool and abdominal distention.  Genitourinary: Negative for frequency and hematuria.  Musculoskeletal: Negative for back pain, joint swelling and gait problem.  Skin: Negative for rash.  Neurological: Negative for dizziness, tremors, speech difficulty and weakness.  Psychiatric/Behavioral: Negative for sleep disturbance, dysphoric mood and agitation. The patient is not nervous/anxious.        Objective:   Physical Exam  Constitutional: He is oriented to person, place, and time. He appears well-developed.       obese  HENT:  Mouth/Throat: Oropharynx is clear and moist.  Eyes: Conjunctivae are normal. Pupils are equal, round, and reactive to light.  Neck: Normal range of motion. No JVD present. No thyromegaly present.  Cardiovascular: Normal rate, regular  rhythm, normal heart sounds and intact distal pulses.  Exam reveals no gallop and no friction rub.   No murmur heard. Pulmonary/Chest: Effort normal and breath sounds normal. No respiratory distress. He has no wheezes. He has no rales. He exhibits no tenderness.  Abdominal: Soft. Bowel sounds are normal. He exhibits no distension and no mass. There is no tenderness. There is no rebound and no guarding.  Musculoskeletal: Normal range of motion. He exhibits edema. He exhibits no tenderness.  Lymphadenopathy:    He has no cervical adenopathy.  Neurological: He is alert and oriented to person, place, and time. He has normal reflexes. No cranial nerve deficit. He exhibits normal muscle tone. Coordination normal.  Skin: Skin is warm and dry. No rash noted.  Psychiatric: He has a normal mood and affect. His behavior is normal. Judgment and thought content normal.    Lab Results  Component Value Date   WBC 8.3 06/17/2010   HGB 13.9 06/17/2010   HCT 39.7 06/17/2010   PLT 302.0 06/17/2010   GLUCOSE 102* 11/26/2010   CHOL 140 11/12/2009   TRIG 100.0 11/12/2009   HDL 41.10 11/12/2009   LDLCALC 79 11/12/2009   ALT 22 08/26/2010   AST 21 08/26/2010   NA 137 11/26/2010   K 4.1 11/26/2010   CL 106 11/26/2010   CREATININE 1.0 11/26/2010   BUN 20 11/26/2010   CO2 22 11/26/2010   TSH 1.46 06/17/2010   PSA 1.78 11/12/2009   INR 2.2* 11/26/2010   HGBA1C 6.1 06/17/2010  Assessment & Plan:

## 2011-03-19 ENCOUNTER — Telehealth: Payer: Self-pay | Admitting: Internal Medicine

## 2011-03-19 DIAGNOSIS — Z86718 Personal history of other venous thrombosis and embolism: Secondary | ICD-10-CM

## 2011-03-19 NOTE — Telephone Encounter (Signed)
Notified pt. He states he has recently moved about 70 miles away. Pt is continuing to see [place] for now but states he may have difficulty returning to our lab in 4 weeks. Advised pt he may locate a lab closer to his home that we may send an order to. Pt wants to know why he needs to return for recheck in 4 weeks?  Please advise.

## 2011-03-19 NOTE — Telephone Encounter (Signed)
Joseph [name] , please, inform the patient: INR is 3.1. Take Coumadin as before. Recheck INR in 4 weeks.  Please, mail the labs to the patient.     Thank you !

## 2011-03-20 NOTE — Telephone Encounter (Signed)
Ok - pls mail Thx

## 2011-03-23 NOTE — Telephone Encounter (Signed)
Spoke with pt, he states he will return to [facility] for recheck PT/INR around the first of April. Future lab order placed and results mailed to pt.

## 2011-03-30 ENCOUNTER — Encounter: Payer: Self-pay | Admitting: Internal Medicine

## 2011-03-30 DIAGNOSIS — R413 Other amnesia: Secondary | ICD-10-CM | POA: Insufficient documentation

## 2011-03-30 NOTE — Assessment & Plan Note (Signed)
See Meds 

## 2011-03-30 NOTE — Assessment & Plan Note (Signed)
Continue with current prescription therapy as reflected on the Med list.  

## 2011-03-30 NOTE — Assessment & Plan Note (Signed)
2/13 during hard sex w/Viagra use D/c Viagra

## 2011-03-30 NOTE — Assessment & Plan Note (Signed)
On Coumadin. We discussed Joseph [name] as another avail option

## 2011-04-10 ENCOUNTER — Telehealth: Payer: Self-pay | Admitting: Internal Medicine

## 2011-04-10 MED ORDER — TADALAFIL 20 MG PO TABS: 20.0000 mg | ORAL_TABLET | Freq: Every day | ORAL | Status: DC | PRN

## 2011-04-10 NOTE — Telephone Encounter (Signed)
done

## 2011-04-10 NOTE — Telephone Encounter (Signed)
Misty Stanley, please, mail him the cialis Rx Thx

## 2011-04-13 ENCOUNTER — Other Ambulatory Visit (INDEPENDENT_AMBULATORY_CARE_PROVIDER_SITE_OTHER): Payer: PRIVATE HEALTH INSURANCE

## 2011-04-13 ENCOUNTER — Telehealth: Payer: Self-pay | Admitting: Internal Medicine

## 2011-04-13 DIAGNOSIS — Z86718 Personal history of other venous thrombosis and embolism: Secondary | ICD-10-CM

## 2011-04-13 LAB — PROTIME-INR
INR: 2.8 ratio — ABNORMAL HIGH (ref 0.8–1.0)
Prothrombin Time: 30.7 s — ABNORMAL HIGH (ref 10.2–12.4)

## 2011-04-13 MED ORDER — TADALAFIL 20 MG PO TABS: 20.0000 mg | ORAL_TABLET | Freq: Every day | ORAL | Status: DC | PRN

## 2011-04-13 NOTE — Telephone Encounter (Signed)
Pt informed

## 2011-04-13 NOTE — Telephone Encounter (Signed)
INR today He can pick up Cialis rx at around 3 pm Thx

## 2011-04-13 NOTE — Telephone Encounter (Signed)
Joseph [name] , please, inform the patient: INR is better. Take Coumadin as before. Recheck INR in 12 weeks. Thank you !

## 2011-04-15 NOTE — Telephone Encounter (Signed)
Pt informed

## 2011-06-24 ENCOUNTER — Ambulatory Visit (INDEPENDENT_AMBULATORY_CARE_PROVIDER_SITE_OTHER): Payer: PRIVATE HEALTH INSURANCE | Admitting: Internal Medicine

## 2011-06-24 ENCOUNTER — Other Ambulatory Visit (INDEPENDENT_AMBULATORY_CARE_PROVIDER_SITE_OTHER): Payer: PRIVATE HEALTH INSURANCE

## 2011-06-24 ENCOUNTER — Encounter: Payer: Self-pay | Admitting: Internal Medicine

## 2011-06-24 ENCOUNTER — Telehealth: Payer: Self-pay | Admitting: Internal Medicine

## 2011-06-24 VITALS — BP 118/88 | HR 76 | Temp 98.1°F | Resp 16 | Wt 302.0 lb

## 2011-06-24 DIAGNOSIS — E669 Obesity, unspecified: Secondary | ICD-10-CM

## 2011-06-24 DIAGNOSIS — E291 Testicular hypofunction: Secondary | ICD-10-CM

## 2011-06-24 DIAGNOSIS — R609 Edema, unspecified: Secondary | ICD-10-CM

## 2011-06-24 DIAGNOSIS — N400 Enlarged prostate without lower urinary tract symptoms: Secondary | ICD-10-CM

## 2011-06-24 DIAGNOSIS — E039 Hypothyroidism, unspecified: Secondary | ICD-10-CM

## 2011-06-24 DIAGNOSIS — F411 Generalized anxiety disorder: Secondary | ICD-10-CM

## 2011-06-24 LAB — TESTOSTERONE: Testosterone: 265.94 ng/dL — ABNORMAL LOW (ref 350.00–890.00)

## 2011-06-24 LAB — BASIC METABOLIC PANEL
BUN: 20 mg/dL (ref 6–23)
CO2: 25 mEq/L (ref 19–32)
Calcium: 8.7 mg/dL (ref 8.4–10.5)
Chloride: 107 mEq/L (ref 96–112)
Creatinine, Ser: 1 mg/dL (ref 0.4–1.5)
GFR: 81.72 mL/min (ref >60.00)
Glucose, Bld: 87 mg/dL (ref 70–99)
Potassium: 4.4 mEq/L (ref 3.5–5.1)
Sodium: 140 mEq/L (ref 135–145)

## 2011-06-24 LAB — PSA: PSA: 0.66 ng/mL (ref 0.10–4.00)

## 2011-06-24 LAB — PROTIME-INR
INR: 2 ratio — ABNORMAL HIGH (ref 0.8–1.0)
Prothrombin Time: 22.2 s — ABNORMAL HIGH (ref 10.2–12.4)

## 2011-06-24 LAB — TSH: TSH: 1.13 u[IU]/mL (ref 0.35–5.50)

## 2011-06-24 MED ORDER — ALPRAZOLAM 1 MG PO TABS: 0.5000 mg | ORAL_TABLET | Freq: Two times a day (BID) | ORAL | Status: DC | PRN

## 2011-06-24 NOTE — Telephone Encounter (Signed)
Joseph [name] , please, inform the patient: INR is OK. Take Coumadin as before.   Please, mail the labs to the patient.     Thank you !

## 2011-06-24 NOTE — Assessment & Plan Note (Signed)
Refractory Lap band was discussed  

## 2011-06-24 NOTE — Assessment & Plan Note (Signed)
Continue with current prescription therapy as reflected on the Med list.  

## 2011-06-24 NOTE — Assessment & Plan Note (Signed)
Doing fair 

## 2011-06-24 NOTE — Assessment & Plan Note (Signed)
Compression socks 

## 2011-06-24 NOTE — Progress Notes (Signed)
   Subjective:    Patient ID: Joseph [name], male    DOB: Aug 06, 1950, 61 y.o.   MRN: [id number]  HPI   He moved to [place], [place]  The patient is here to follow up on chronic ED, anxiety, DVT and chronic moderate fibromyalgia symptoms controlled with medicines, diet and exercise.  F/u global amnesia x 5 h on Sun 6 wks ago that started during a very hard sex (he took Viagra prior to it). No relapse. No problems w/cialis. C/o wt gain  Wt Readings from Last 3 Encounters:  06/24/11 302 lb (136.986 kg)  03/18/11 292 lb (132.45 kg)  01/02/11 275 lb (124.739 kg)   BP Readings from Last 3 Encounters:  06/24/11 118/88  03/18/11 120/80  01/02/11 116/74       Review of Systems  Constitutional: Negative for appetite change, fatigue and unexpected weight change.  HENT: Negative for nosebleeds, congestion, sore throat, sneezing, trouble swallowing and neck pain.   Eyes: Negative for itching and visual disturbance.  Respiratory: Negative for cough.   Cardiovascular: Negative for chest pain, palpitations and leg swelling.  Gastrointestinal: Negative for nausea, diarrhea, blood in stool and abdominal distention.  Genitourinary: Negative for frequency and hematuria.  Musculoskeletal: Negative for back pain, joint swelling and gait problem.  Skin: Negative for rash.  Neurological: Negative for dizziness, tremors, speech difficulty and weakness.  Psychiatric/Behavioral: Negative for sleep disturbance, dysphoric mood and agitation. The patient is not nervous/anxious.        Objective:   Physical Exam  Constitutional: He is oriented to person, place, and time. He appears well-developed.       obese  HENT:  Mouth/Throat: Oropharynx is clear and moist.  Eyes: Conjunctivae are normal. Pupils are equal, round, and reactive to light.  Neck: Normal range of motion. No JVD present. No thyromegaly present.  Cardiovascular: Normal rate, regular rhythm, normal heart sounds and intact distal pulses.   Exam reveals no gallop and no friction rub.   No murmur heard. Pulmonary/Chest: Effort normal and breath sounds normal. No respiratory distress. He has no wheezes. He has no rales. He exhibits no tenderness.  Abdominal: Soft. Bowel sounds are normal. He exhibits no distension and no mass. There is no tenderness. There is no rebound and no guarding.  Musculoskeletal: Normal range of motion. He exhibits edema. He exhibits no tenderness.  Lymphadenopathy:    He has no cervical adenopathy.  Neurological: He is alert and oriented to person, place, and time. He has normal reflexes. No cranial nerve deficit. He exhibits normal muscle tone. Coordination normal.  Skin: Skin is warm and dry. No rash noted.  Psychiatric: He has a normal mood and affect. His behavior is normal. Judgment and thought content normal.    Lab Results  Component Value Date   WBC 8.3 06/17/2010   HGB 13.9 06/17/2010   HCT 39.7 06/17/2010   PLT 302.0 06/17/2010   GLUCOSE 96 03/18/2011   CHOL 140 11/12/2009   TRIG 100.0 11/12/2009   HDL 41.10 11/12/2009   LDLCALC 79 11/12/2009   ALT 22 08/26/2010   AST 21 08/26/2010   NA 138 03/18/2011   K 4.4 03/18/2011   CL 107 03/18/2011   CREATININE 1.1 03/18/2011   BUN 20 03/18/2011   CO2 24 03/18/2011   TSH 1.46 06/17/2010   PSA 1.78 11/12/2009   INR 2.8* 04/13/2011   HGBA1C 6.1 06/17/2010         Assessment & Plan:

## 2011-06-25 NOTE — Telephone Encounter (Signed)
Pt informed

## 2011-06-28 ENCOUNTER — Encounter: Payer: Self-pay | Admitting: Internal Medicine

## 2011-09-30 ENCOUNTER — Ambulatory Visit: Payer: PRIVATE HEALTH INSURANCE | Admitting: Internal Medicine

## 2011-10-26 ENCOUNTER — Other Ambulatory Visit (INDEPENDENT_AMBULATORY_CARE_PROVIDER_SITE_OTHER): Payer: PRIVATE HEALTH INSURANCE

## 2011-10-26 ENCOUNTER — Ambulatory Visit (INDEPENDENT_AMBULATORY_CARE_PROVIDER_SITE_OTHER): Payer: PRIVATE HEALTH INSURANCE | Admitting: Internal Medicine

## 2011-10-26 ENCOUNTER — Encounter: Payer: Self-pay | Admitting: Internal Medicine

## 2011-10-26 VITALS — BP 122/80 | HR 80 | Temp 97.3°F | Resp 16 | Wt 304.5 lb

## 2011-10-26 DIAGNOSIS — I872 Venous insufficiency (chronic) (peripheral): Secondary | ICD-10-CM

## 2011-10-26 DIAGNOSIS — Z23 Encounter for immunization: Secondary | ICD-10-CM

## 2011-10-26 DIAGNOSIS — E039 Hypothyroidism, unspecified: Secondary | ICD-10-CM

## 2011-10-26 DIAGNOSIS — R609 Edema, unspecified: Secondary | ICD-10-CM

## 2011-10-26 DIAGNOSIS — E291 Testicular hypofunction: Secondary | ICD-10-CM

## 2011-10-26 DIAGNOSIS — E669 Obesity, unspecified: Secondary | ICD-10-CM

## 2011-10-26 LAB — PROTIME-INR
INR: 3 ratio — ABNORMAL HIGH (ref 0.8–1.0)
Prothrombin Time: 31.2 s — ABNORMAL HIGH (ref 10.2–12.4)

## 2011-10-26 MED ORDER — ALPRAZOLAM 1 MG PO TABS: 0.5000 mg | ORAL_TABLET | Freq: Two times a day (BID) | ORAL | Status: DC | PRN

## 2011-10-26 MED ORDER — LEVOTHYROXINE SODIUM 25 MCG PO TABS: 25.0000 ug | ORAL_TABLET | Freq: Every day | ORAL | Status: DC

## 2011-10-26 MED ORDER — TADALAFIL 20 MG PO TABS: 20.0000 mg | ORAL_TABLET | Freq: Every day | ORAL | Status: DC | PRN

## 2011-10-26 MED ORDER — VASCULERA PO TABS: 1.0000 | ORAL_TABLET | Freq: Two times a day (BID) | ORAL | Status: DC

## 2011-10-26 MED ORDER — WARFARIN SODIUM 10 MG PO TABS: 10.0000 mg | ORAL_TABLET | ORAL | Status: DC

## 2011-10-26 NOTE — Assessment & Plan Note (Signed)
Continue with current prescription therapy as reflected on the Med list.  

## 2011-10-26 NOTE — Progress Notes (Signed)
Subjective:    Patient ID: Joseph [name], male    DOB: [birth year], 61 y.o.   MRN: [id number]  HPI   He lives in [place], [place] now  The patient is here to follow up on chronic ED, anxiety, DVT and chronic moderate fibromyalgia, hypogonadism symptoms controlled with medicines, diet and exercise.  No more episodes of global amnesia x 5 h on Sun 6 wks ago that started during a very hard sex (he took Viagra prior to it). No relapse. No problems w/cialis. C/o wt gain.  Wt Readings from Last 3 Encounters:  10/26/11 304 lb 8 oz (138.12 kg)  06/24/11 302 lb (136.986 kg)  03/18/11 292 lb (132.45 kg)   BP Readings from Last 3 Encounters:  10/26/11 122/80  06/24/11 118/88  03/18/11 120/80       Review of Systems  Constitutional: Negative for appetite change, fatigue and unexpected weight change.  HENT: Negative for nosebleeds, congestion, sore throat, sneezing, trouble swallowing and neck pain.   Eyes: Negative for itching and visual disturbance.  Respiratory: Negative for cough.   Cardiovascular: Negative for chest pain, palpitations and leg swelling.  Gastrointestinal: Negative for nausea, diarrhea, blood in stool and abdominal distention.  Genitourinary: Negative for frequency and hematuria.  Musculoskeletal: Negative for back pain, joint swelling and gait problem.  Skin: Negative for rash.  Neurological: Negative for dizziness, tremors, speech difficulty and weakness.  Psychiatric/Behavioral: Negative for disturbed wake/sleep cycle, dysphoric mood and agitation. The patient is not nervous/anxious.        Objective:   Physical Exam  Constitutional: He is oriented to person, place, and time. He appears well-developed.       obese  HENT:  Mouth/Throat: Oropharynx is clear and moist.  Eyes: Conjunctivae normal are normal. Pupils are equal, round, and reactive to light.  Neck: Normal range of motion. No JVD present. No thyromegaly present.  Cardiovascular: Normal rate, regular  rhythm, normal heart sounds and intact distal pulses.  Exam reveals no gallop and no friction rub.   No murmur heard. Pulmonary/Chest: Effort normal and breath sounds normal. No respiratory distress. He has no wheezes. He has no rales. He exhibits no tenderness.  Abdominal: Soft. Bowel sounds are normal. He exhibits no distension and no mass. There is no tenderness. There is no rebound and no guarding.  Musculoskeletal: Normal range of motion. He exhibits edema. He exhibits no tenderness.  Lymphadenopathy:    He has no cervical adenopathy.  Neurological: He is alert and oriented to person, place, and time. He has normal reflexes. No cranial nerve deficit. He exhibits normal muscle tone. Coordination normal.  Skin: Skin is warm and dry. No rash noted.  Psychiatric: He has a normal mood and affect. His behavior is normal. Judgment and thought content normal.    Lab Results  Component Value Date   WBC 8.3 06/17/2010   HGB 13.9 06/17/2010   HCT 39.7 06/17/2010   PLT 302.0 06/17/2010   GLUCOSE 87 06/24/2011   CHOL 140 11/12/2009   TRIG 100.0 11/12/2009   HDL 41.10 11/12/2009   LDLCALC 79 11/12/2009   ALT 22 08/26/2010   AST 21 08/26/2010   NA 140 06/24/2011   K 4.4 06/24/2011   CL 107 06/24/2011   CREATININE 1.0 06/24/2011   BUN 20 06/24/2011   CO2 25 06/24/2011   TSH 1.13 06/24/2011   PSA 0.66 06/24/2011   INR 2.0* 06/24/2011   HGBA1C 6.1 06/17/2010         Assessment &  Plan:

## 2011-10-26 NOTE — Assessment & Plan Note (Signed)
Refractory Lap band was discussed

## 2011-10-26 NOTE — Assessment & Plan Note (Signed)
Post-DVT  Wt loss Sock Elevation

## 2011-10-26 NOTE — Assessment & Plan Note (Signed)
10/13 - never used Rx

## 2011-10-27 ENCOUNTER — Telehealth: Payer: Self-pay | Admitting: Internal Medicine

## 2011-10-27 NOTE — Telephone Encounter (Signed)
Joseph [name] , please, inform the patient: INR is OK (3.0). Take Coumadin as before. Recheck INR as before. Thank you !

## 2011-10-29 ENCOUNTER — Telehealth: Payer: Self-pay | Admitting: General Practice

## 2011-10-29 NOTE — Telephone Encounter (Signed)
LMOM in regard to INR.  Asked pt to return call to coumadin clinic if any questions.

## 2012-01-27 ENCOUNTER — Other Ambulatory Visit (INDEPENDENT_AMBULATORY_CARE_PROVIDER_SITE_OTHER): Payer: PRIVATE HEALTH INSURANCE

## 2012-01-27 ENCOUNTER — Ambulatory Visit (INDEPENDENT_AMBULATORY_CARE_PROVIDER_SITE_OTHER): Payer: PRIVATE HEALTH INSURANCE | Admitting: Internal Medicine

## 2012-01-27 ENCOUNTER — Encounter: Payer: Self-pay | Admitting: Internal Medicine

## 2012-01-27 VITALS — BP 122/84 | HR 71 | Temp 98.6°F | Resp 16 | Wt 305.0 lb

## 2012-01-27 DIAGNOSIS — I872 Venous insufficiency (chronic) (peripheral): Secondary | ICD-10-CM

## 2012-01-27 DIAGNOSIS — E291 Testicular hypofunction: Secondary | ICD-10-CM

## 2012-01-27 DIAGNOSIS — R413 Other amnesia: Secondary | ICD-10-CM

## 2012-01-27 DIAGNOSIS — F440 Dissociative amnesia: Secondary | ICD-10-CM

## 2012-01-27 LAB — PROTIME-INR
INR: 3.1 ratio — ABNORMAL HIGH (ref 0.8–1.0)
Prothrombin Time: 32.3 s — ABNORMAL HIGH (ref 10.2–12.4)

## 2012-01-27 MED ORDER — LORCASERIN HCL 10 MG PO TABS: 1.0000 | ORAL_TABLET | Freq: Two times a day (BID) | ORAL | Status: DC

## 2012-01-27 MED ORDER — DICLOFENAC SODIUM 1 % TD GEL: 2.0000 g | Freq: Four times a day (QID) | TRANSDERMAL | Status: DC

## 2012-01-27 NOTE — Assessment & Plan Note (Signed)
He chose to wait on using testosterone

## 2012-01-27 NOTE — Progress Notes (Signed)
Subjective:    Patient ID: Joseph [name], male    DOB: 02-19-1950, 62 y.o.   MRN: [id number]  HPI   He lives in [place], [place] now  The patient is here to follow up on chronic ED, anxiety, DVT and chronic moderate fibromyalgia, hypogonadism symptoms controlled with medicines, diet and exercise.  No more episodes of global amnesia x 5 h on Sun 6 wks ago that started during a very hard sex (he took Viagra prior to it). No relapse. No problems w/cialis. C/o wt gain.  Wt Readings from Last 3 Encounters:  01/27/12 305 lb (138.347 kg)  10/26/11 304 lb 8 oz (138.12 kg)  06/24/11 302 lb (136.986 kg)   BP Readings from Last 3 Encounters:  01/27/12 122/84  10/26/11 122/80  06/24/11 118/88       Review of Systems  Constitutional: Negative for appetite change, fatigue and unexpected weight change.  HENT: Negative for nosebleeds, congestion, sore throat, sneezing, trouble swallowing and neck pain.   Eyes: Negative for itching and visual disturbance.  Respiratory: Negative for cough.   Cardiovascular: Negative for chest pain, palpitations and leg swelling.  Gastrointestinal: Negative for nausea, diarrhea, blood in stool and abdominal distention.  Genitourinary: Negative for frequency and hematuria.  Musculoskeletal: Negative for back pain, joint swelling and gait problem.  Skin: Negative for rash.  Neurological: Negative for dizziness, tremors, speech difficulty and weakness.  Psychiatric/Behavioral: Negative for sleep disturbance, dysphoric mood and agitation. The patient is not nervous/anxious.        Objective:   Physical Exam  Constitutional: He is oriented to person, place, and time. He appears well-developed.       obese  HENT:  Mouth/Throat: Oropharynx is clear and moist.  Eyes: Conjunctivae normal are normal. Pupils are equal, round, and reactive to light.  Neck: Normal range of motion. No JVD present. No thyromegaly present.  Cardiovascular: Normal rate, regular rhythm,  normal heart sounds and intact distal pulses.  Exam reveals no gallop and no friction rub.   No murmur heard. Pulmonary/Chest: Effort normal and breath sounds normal. No respiratory distress. He has no wheezes. He has no rales. He exhibits no tenderness.  Abdominal: Soft. Bowel sounds are normal. He exhibits no distension and no mass. There is no tenderness. There is no rebound and no guarding.  Musculoskeletal: Normal range of motion. He exhibits edema. He exhibits no tenderness.  Lymphadenopathy:    He has no cervical adenopathy.  Neurological: He is alert and oriented to person, place, and time. He has normal reflexes. No cranial nerve deficit. He exhibits normal muscle tone. Coordination normal.  Skin: Skin is warm and dry. No rash noted.  Psychiatric: He has a normal mood and affect. His behavior is normal. Judgment and thought content normal.    Lab Results  Component Value Date   WBC 8.3 06/17/2010   HGB 13.9 06/17/2010   HCT 39.7 06/17/2010   PLT 302.0 06/17/2010   GLUCOSE 87 06/24/2011   CHOL 140 11/12/2009   TRIG 100.0 11/12/2009   HDL 41.10 11/12/2009   LDLCALC 79 11/12/2009   ALT 22 08/26/2010   AST 21 08/26/2010   NA 140 06/24/2011   K 4.4 06/24/2011   CL 107 06/24/2011   CREATININE 1.0 06/24/2011   BUN 20 06/24/2011   CO2 25 06/24/2011   TSH 1.13 06/24/2011   PSA 0.66 06/24/2011   INR 3.0* 10/26/2011   HGBA1C 6.1 06/17/2010         Assessment &  Plan:

## 2012-01-27 NOTE — Assessment & Plan Note (Signed)
No relapse 

## 2012-01-29 ENCOUNTER — Telehealth: Payer: Self-pay | Admitting: Internal Medicine

## 2012-01-29 NOTE — Telephone Encounter (Signed)
Joseph [name], please, inform patient that his INR was 3.1. Cont w/same dose Pls ask the pt to check on Xarelto pricing (another blood thinner option) as we discussed Thx

## 2012-01-29 NOTE — Telephone Encounter (Signed)
Left mess for patient to call back.  

## 2012-02-02 NOTE — Telephone Encounter (Signed)
Pt informed

## 2012-04-27 ENCOUNTER — Ambulatory Visit: Payer: PRIVATE HEALTH INSURANCE | Admitting: Internal Medicine

## 2012-05-30 ENCOUNTER — Ambulatory Visit (INDEPENDENT_AMBULATORY_CARE_PROVIDER_SITE_OTHER): Payer: PRIVATE HEALTH INSURANCE | Admitting: Internal Medicine

## 2012-05-30 ENCOUNTER — Other Ambulatory Visit (INDEPENDENT_AMBULATORY_CARE_PROVIDER_SITE_OTHER): Payer: PRIVATE HEALTH INSURANCE

## 2012-05-30 ENCOUNTER — Encounter: Payer: Self-pay | Admitting: Internal Medicine

## 2012-05-30 VITALS — BP 130/90 | HR 80 | Temp 98.0°F | Resp 16 | Wt 295.0 lb

## 2012-05-30 DIAGNOSIS — I872 Venous insufficiency (chronic) (peripheral): Secondary | ICD-10-CM

## 2012-05-30 DIAGNOSIS — F411 Generalized anxiety disorder: Secondary | ICD-10-CM

## 2012-05-30 DIAGNOSIS — R609 Edema, unspecified: Secondary | ICD-10-CM

## 2012-05-30 DIAGNOSIS — M25519 Pain in unspecified shoulder: Secondary | ICD-10-CM

## 2012-05-30 DIAGNOSIS — Z0000 Encounter for general adult medical examination without abnormal findings: Secondary | ICD-10-CM

## 2012-05-30 DIAGNOSIS — F528 Other sexual dysfunction not due to a substance or known physiological condition: Secondary | ICD-10-CM

## 2012-05-30 DIAGNOSIS — M25511 Pain in right shoulder: Secondary | ICD-10-CM

## 2012-05-30 DIAGNOSIS — R413 Other amnesia: Secondary | ICD-10-CM

## 2012-05-30 DIAGNOSIS — E039 Hypothyroidism, unspecified: Secondary | ICD-10-CM

## 2012-05-30 DIAGNOSIS — E669 Obesity, unspecified: Secondary | ICD-10-CM

## 2012-05-30 DIAGNOSIS — F440 Dissociative amnesia: Secondary | ICD-10-CM

## 2012-05-30 LAB — CBC WITH DIFFERENTIAL/PLATELET
Basophils Absolute: 0 10*3/uL (ref 0.0–0.1)
Basophils Relative: 0.4 % (ref 0.0–3.0)
Eosinophils Absolute: 0.1 10*3/uL (ref 0.0–0.7)
Eosinophils Relative: 1.8 % (ref 0.0–5.0)
HCT: 42.3 % (ref 39.0–52.0)
Hemoglobin: 14.7 g/dL (ref 13.0–17.0)
Lymphocytes Relative: 21.9 % (ref 12.0–46.0)
Lymphs Abs: 1.7 10*3/uL (ref 0.7–4.0)
MCHC: 34.8 g/dL (ref 30.0–36.0)
MCV: 95.5 fl (ref 78.0–100.0)
Monocytes Absolute: 0.6 10*3/uL (ref 0.1–1.0)
Monocytes Relative: 7.7 % (ref 3.0–12.0)
Neutro Abs: 5.3 10*3/uL (ref 1.4–7.7)
Neutrophils Relative %: 68.2 % (ref 43.0–77.0)
Platelets: 349 10*3/uL (ref 150.0–400.0)
RBC: 4.43 Mil/uL (ref 4.22–5.81)
RDW: 12.7 % (ref 11.5–14.6)
WBC: 7.7 10*3/uL (ref 4.5–10.5)

## 2012-05-30 LAB — URINALYSIS
Bilirubin Urine: NEGATIVE
Hgb urine dipstick: NEGATIVE
Ketones, ur: NEGATIVE
Leukocytes, UA: NEGATIVE
Nitrite: NEGATIVE
Specific Gravity, Urine: 1.025 (ref 1.000–1.030)
Total Protein, Urine: NEGATIVE
Urine Glucose: NEGATIVE
Urobilinogen, UA: 0.2 (ref 0.0–1.0)
pH: 6 (ref 5.0–8.0)

## 2012-05-30 LAB — HEPATIC FUNCTION PANEL
ALT: 25 U/L (ref 0–53)
AST: 27 U/L (ref 0–37)
Albumin: 3.9 g/dL (ref 3.5–5.2)
Alkaline Phosphatase: 71 U/L (ref 39–117)
Bilirubin, Direct: 0.1 mg/dL (ref 0.0–0.3)
Total Bilirubin: 0.6 mg/dL (ref 0.3–1.2)
Total Protein: 6.5 g/dL (ref 6.0–8.3)

## 2012-05-30 LAB — TSH: TSH: 1.05 u[IU]/mL (ref 0.35–5.50)

## 2012-05-30 LAB — BASIC METABOLIC PANEL
BUN: 17 mg/dL (ref 6–23)
CO2: 23 mEq/L (ref 19–32)
Calcium: 8.7 mg/dL (ref 8.4–10.5)
Chloride: 106 mEq/L (ref 96–112)
Creatinine, Ser: 1 mg/dL (ref 0.4–1.5)
GFR: 80.52 mL/min (ref >60.00)
Glucose, Bld: 103 mg/dL — ABNORMAL HIGH (ref 70–99)
Potassium: 4.3 mEq/L (ref 3.5–5.1)
Sodium: 137 mEq/L (ref 135–145)

## 2012-05-30 LAB — PROTIME-INR
INR: 4.5 ratio — ABNORMAL HIGH (ref 0.8–1.0)
Prothrombin Time: 46.3 s — ABNORMAL HIGH (ref 10.2–12.4)

## 2012-05-30 LAB — LIPID PANEL
Cholesterol: 146 mg/dL (ref 0–200)
HDL: 36.6 mg/dL — ABNORMAL LOW (ref >39.00)
LDL Cholesterol: 90 mg/dL (ref 0–99)
Total CHOL/HDL Ratio: 4
Triglycerides: 95 mg/dL (ref 0.0–149.0)
VLDL: 19 mg/dL (ref 0.0–40.0)

## 2012-05-30 LAB — PSA: PSA: 0.56 ng/mL (ref 0.10–4.00)

## 2012-05-30 MED ORDER — ALPRAZOLAM 1 MG PO TABS: 0.5000 mg | ORAL_TABLET | Freq: Two times a day (BID) | ORAL | Status: DC | PRN

## 2012-05-30 MED ORDER — SILDENAFIL CITRATE 100 MG PO TABS: 100.0000 mg | ORAL_TABLET | ORAL | Status: DC | PRN

## 2012-05-30 NOTE — Assessment & Plan Note (Signed)
Use compr hose

## 2012-05-30 NOTE — Assessment & Plan Note (Signed)
Continue with current prescription therapy as reflected on the Med list.  

## 2012-05-30 NOTE — Assessment & Plan Note (Signed)
He would like to switch back to Viagra  Potential benefits of Viagra use as well as potential risks  and complications were explained to the patient and were aknowledged.

## 2012-05-30 NOTE — Assessment & Plan Note (Addendum)
No relapse. Use Viagra /caution

## 2012-05-30 NOTE — Progress Notes (Signed)
Patient ID: Joseph [name], male   DOB: [birth year], 62 y.o.   MRN: [id number]   Subjective:    Patient ID: Joseph [name], male    DOB: [birth year], 62 y.o.   MRN: [id number]  HPI   He lives in [place], [place] now  The patient is here to follow up on chronic ED, anxiety, DVT and chronic moderate fibromyalgia, hypogonadism symptoms controlled with medicines, diet and exercise.  No more episodes of global amnesia x 5 h on Sun 6 wks ago that started during a very hard sex (he took Viagra prior to it). No relapse. No problems w/cialis. C/o wt gain.  Wt Readings from Last 3 Encounters:  05/30/12 295 lb (133.811 kg)  01/27/12 305 lb (138.347 kg)  10/26/11 304 lb 8 oz (138.12 kg)   BP Readings from Last 3 Encounters:  05/30/12 130/90  01/27/12 122/84  10/26/11 122/80       Review of Systems  Constitutional: Negative for appetite change, fatigue and unexpected weight change.  HENT: Negative for nosebleeds, congestion, sore throat, sneezing, trouble swallowing and neck pain.   Eyes: Negative for itching and visual disturbance.  Respiratory: Negative for cough.   Cardiovascular: Negative for chest pain, palpitations and leg swelling.  Gastrointestinal: Negative for nausea, diarrhea, blood in stool and abdominal distention.  Genitourinary: Negative for frequency and hematuria.  Musculoskeletal: Negative for back pain, joint swelling and gait problem.  Skin: Negative for rash.  Neurological: Negative for dizziness, tremors, speech difficulty and weakness.  Psychiatric/Behavioral: Negative for sleep disturbance, dysphoric mood and agitation. The patient is not nervous/anxious.        Objective:   Physical Exam  Constitutional: He is oriented to person, place, and time. He appears well-developed.  obese  HENT:  Mouth/Throat: Oropharynx is clear and moist.  Eyes: Conjunctivae are normal. Pupils are equal, round, and reactive to light.  Neck: Normal range of motion. No JVD present. No  thyromegaly present.  Cardiovascular: Normal rate, regular rhythm, normal heart sounds and intact distal pulses.  Exam reveals no gallop and no friction rub.   No murmur heard. Pulmonary/Chest: Effort normal and breath sounds normal. No respiratory distress. He has no wheezes. He has no rales. He exhibits no tenderness.  Abdominal: Soft. Bowel sounds are normal. He exhibits no distension and no mass. There is no tenderness. There is no rebound and no guarding.  Musculoskeletal: Normal range of motion. He exhibits edema. He exhibits no tenderness.  Lymphadenopathy:    He has no cervical adenopathy.  Neurological: He is alert and oriented to person, place, and time. He has normal reflexes. No cranial nerve deficit. He exhibits normal muscle tone. Coordination normal.  Skin: Skin is warm and dry. No rash noted.  Psychiatric: He has a normal mood and affect. His behavior is normal. Judgment and thought content normal.    Lab Results  Component Value Date   WBC 8.3 06/17/2010   HGB 13.9 06/17/2010   HCT 39.7 06/17/2010   PLT 302.0 06/17/2010   GLUCOSE 87 06/24/2011   CHOL 140 11/12/2009   TRIG 100.0 11/12/2009   HDL 41.10 11/12/2009   LDLCALC 79 11/12/2009   ALT 22 08/26/2010   AST 21 08/26/2010   NA 140 06/24/2011   K 4.4 06/24/2011   CL 107 06/24/2011   CREATININE 1.0 06/24/2011   BUN 20 06/24/2011   CO2 25 06/24/2011   TSH 1.13 06/24/2011   PSA 0.66 06/24/2011   INR 3.1* 01/27/2012   HGBA1C 6.1  06/17/2010         Assessment & Plan:

## 2012-05-30 NOTE — Assessment & Plan Note (Signed)
Wt Readings from Last 3 Encounters:  05/30/12 295 lb (133.811 kg)  01/27/12 305 lb (138.347 kg)  10/26/11 304 lb 8 oz (138.12 kg)

## 2012-05-30 NOTE — Assessment & Plan Note (Signed)
A little better w/wt loss

## 2012-05-31 ENCOUNTER — Encounter: Payer: Self-pay | Admitting: Internal Medicine

## 2012-05-31 LAB — TESTOSTERONE, FREE, TOTAL, SHBG
Sex Hormone Binding: 57 nmol/L (ref 13–71)
Testosterone, Free: 44.3 pg/mL — ABNORMAL LOW (ref 47.0–244.0)
Testosterone-% Free: 1.4 % — ABNORMAL LOW (ref 1.6–2.9)
Testosterone: 323 ng/dL (ref 300–890)

## 2012-06-01 ENCOUNTER — Other Ambulatory Visit: Payer: Self-pay | Admitting: Internal Medicine

## 2012-06-01 MED ORDER — SILDENAFIL CITRATE 100 MG PO TABS: 100.0000 mg | ORAL_TABLET | ORAL | Status: DC | PRN

## 2012-06-03 ENCOUNTER — Ambulatory Visit: Payer: PRIVATE HEALTH INSURANCE | Admitting: Internal Medicine

## 2012-06-08 ENCOUNTER — Encounter: Payer: Self-pay | Admitting: Internal Medicine

## 2012-06-16 ENCOUNTER — Other Ambulatory Visit: Payer: Self-pay | Admitting: Internal Medicine

## 2012-06-16 MED ORDER — SILDENAFIL CITRATE 100 MG PO TABS: 100.0000 mg | ORAL_TABLET | ORAL | Status: DC | PRN

## 2012-06-17 ENCOUNTER — Other Ambulatory Visit: Payer: Self-pay | Admitting: Internal Medicine

## 2012-06-17 MED ORDER — SILDENAFIL CITRATE 100 MG PO TABS: 100.0000 mg | ORAL_TABLET | ORAL | Status: DC | PRN

## 2012-09-05 ENCOUNTER — Other Ambulatory Visit (INDEPENDENT_AMBULATORY_CARE_PROVIDER_SITE_OTHER): Payer: PRIVATE HEALTH INSURANCE

## 2012-09-05 ENCOUNTER — Encounter: Payer: Self-pay | Admitting: Internal Medicine

## 2012-09-05 ENCOUNTER — Ambulatory Visit (INDEPENDENT_AMBULATORY_CARE_PROVIDER_SITE_OTHER): Payer: PRIVATE HEALTH INSURANCE | Admitting: Internal Medicine

## 2012-09-05 VITALS — BP 122/76 | HR 97 | Temp 97.9°F | Resp 12 | Ht 74.5 in | Wt 297.1 lb

## 2012-09-05 DIAGNOSIS — Z0000 Encounter for general adult medical examination without abnormal findings: Secondary | ICD-10-CM

## 2012-09-05 DIAGNOSIS — N400 Enlarged prostate without lower urinary tract symptoms: Secondary | ICD-10-CM

## 2012-09-05 DIAGNOSIS — E291 Testicular hypofunction: Secondary | ICD-10-CM

## 2012-09-05 DIAGNOSIS — I872 Venous insufficiency (chronic) (peripheral): Secondary | ICD-10-CM

## 2012-09-05 DIAGNOSIS — F411 Generalized anxiety disorder: Secondary | ICD-10-CM

## 2012-09-05 DIAGNOSIS — E039 Hypothyroidism, unspecified: Secondary | ICD-10-CM

## 2012-09-05 DIAGNOSIS — E669 Obesity, unspecified: Secondary | ICD-10-CM

## 2012-09-05 LAB — PROTIME-INR
INR: 2.9 ratio — ABNORMAL HIGH (ref 0.8–1.0)
Prothrombin Time: 30.2 s — ABNORMAL HIGH (ref 10.2–12.4)

## 2012-09-05 NOTE — Assessment & Plan Note (Signed)
Continue with current prescription therapy as reflected on the Med list.  

## 2012-09-05 NOTE — Assessment & Plan Note (Signed)
Continue with current prn prescription therapy as reflected on the Med list.  

## 2012-09-05 NOTE — Assessment & Plan Note (Signed)
Wt Readings from Last 3 Encounters:  09/05/12 297 lb 1.9 oz (134.773 kg)  05/30/12 295 lb (133.811 kg)  01/27/12 305 lb (138.347 kg)

## 2012-09-05 NOTE — Assessment & Plan Note (Signed)
We discussed age appropriate health related issues, including available/recomended screening tests and vaccinations. We discussed a need for adhering to healthy diet and exercise. Labs/EKG were reviewed/ordered. All questions were answered.   

## 2012-09-05 NOTE — Progress Notes (Signed)
   Subjective:    HPI  C/o LBP w/irrad to L buttock x2 wks. He saw a Land    He lives in [place], [place] now  The patient is here to follow up on chronic ED, anxiety, DVT and chronic moderate fibromyalgia, hypogonadism symptoms controlled with medicines, diet and exercise.  No more episodes of global amnesia x 5 h on Sun 6 wks ago that started during a very hard sex (he took Viagra prior to it). No relapse. No problems w/cialis. C/o wt gain.  Wt Readings from Last 3 Encounters:  09/05/12 297 lb 1.9 oz (134.773 kg)  05/30/12 295 lb (133.811 kg)  01/27/12 305 lb (138.347 kg)   BP Readings from Last 3 Encounters:  09/05/12 122/76  05/30/12 130/90  01/27/12 122/84       Review of Systems  Constitutional: Negative for appetite change, fatigue and unexpected weight change.  HENT: Negative for nosebleeds, congestion, sore throat, sneezing, trouble swallowing and neck pain.   Eyes: Negative for itching and visual disturbance.  Respiratory: Negative for cough.   Cardiovascular: Negative for chest pain, palpitations and leg swelling.  Gastrointestinal: Negative for nausea, diarrhea, blood in stool and abdominal distention.  Genitourinary: Negative for frequency and hematuria.  Musculoskeletal: Negative for back pain, joint swelling and gait problem.  Skin: Negative for rash.  Neurological: Negative for dizziness, tremors, speech difficulty and weakness.  Psychiatric/Behavioral: Negative for sleep disturbance, dysphoric mood and agitation. The patient is not nervous/anxious.        Objective:   Physical Exam  Constitutional: He is oriented to person, place, and time. He appears well-developed.  obese  HENT:  Mouth/Throat: Oropharynx is clear and moist.  Eyes: Conjunctivae are normal. Pupils are equal, round, and reactive to light.  Neck: Normal range of motion. No JVD present. No thyromegaly present.  Cardiovascular: Normal rate, regular rhythm, normal heart sounds and  intact distal pulses.  Exam reveals no gallop and no friction rub.   No murmur heard. Pulmonary/Chest: Effort normal and breath sounds normal. No respiratory distress. He has no wheezes. He has no rales. He exhibits no tenderness.  Abdominal: Soft. Bowel sounds are normal. He exhibits no distension and no mass. There is no tenderness. There is no rebound and no guarding.  Musculoskeletal: Normal range of motion. He exhibits edema. He exhibits no tenderness.  Lymphadenopathy:    He has no cervical adenopathy.  Neurological: He is alert and oriented to person, place, and time. He has normal reflexes. No cranial nerve deficit. He exhibits normal muscle tone. Coordination normal.  Skin: Skin is warm and dry. No rash noted.  Psychiatric: He has a normal mood and affect. His behavior is normal. Judgment and thought content normal.    Lab Results  Component Value Date   WBC 7.7 05/30/2012   HGB 14.7 05/30/2012   HCT 42.3 05/30/2012   PLT 349.0 05/30/2012   GLUCOSE 103* 05/30/2012   CHOL 146 05/30/2012   TRIG 95.0 05/30/2012   HDL 36.60* 05/30/2012   LDLCALC 90 05/30/2012   ALT 25 05/30/2012   AST 27 05/30/2012   NA 137 05/30/2012   K 4.3 05/30/2012   CL 106 05/30/2012   CREATININE 1.0 05/30/2012   BUN 17 05/30/2012   CO2 23 05/30/2012   TSH 1.05 05/30/2012   PSA 0.56 05/30/2012   INR 4.5* 05/30/2012   HGBA1C 6.1 06/17/2010         Assessment & Plan:

## 2012-11-24 ENCOUNTER — Other Ambulatory Visit: Payer: Self-pay

## 2012-12-07 ENCOUNTER — Ambulatory Visit (INDEPENDENT_AMBULATORY_CARE_PROVIDER_SITE_OTHER): Payer: PRIVATE HEALTH INSURANCE | Admitting: Internal Medicine

## 2012-12-07 ENCOUNTER — Encounter: Payer: Self-pay | Admitting: Internal Medicine

## 2012-12-07 ENCOUNTER — Other Ambulatory Visit: Payer: PRIVATE HEALTH INSURANCE

## 2012-12-07 VITALS — BP 120/84 | HR 80 | Temp 97.1°F | Resp 16 | Wt 302.0 lb

## 2012-12-07 DIAGNOSIS — Z86718 Personal history of other venous thrombosis and embolism: Secondary | ICD-10-CM

## 2012-12-07 DIAGNOSIS — F411 Generalized anxiety disorder: Secondary | ICD-10-CM

## 2012-12-07 DIAGNOSIS — Z23 Encounter for immunization: Secondary | ICD-10-CM

## 2012-12-07 DIAGNOSIS — E669 Obesity, unspecified: Secondary | ICD-10-CM

## 2012-12-07 MED ORDER — SILDENAFIL CITRATE 100 MG PO TABS: 100.0000 mg | ORAL_TABLET | ORAL | Status: DC | PRN

## 2012-12-07 MED ORDER — ALPRAZOLAM 1 MG PO TABS: 0.5000 mg | ORAL_TABLET | Freq: Two times a day (BID) | ORAL | Status: DC | PRN

## 2012-12-07 MED ORDER — LEVOTHYROXINE SODIUM 25 MCG PO TABS: 25.0000 ug | ORAL_TABLET | Freq: Every day | ORAL | Status: DC

## 2012-12-07 NOTE — Progress Notes (Signed)
Pre visit review using our clinic review tool, if applicable. No additional management support is needed unless otherwise documented below in the visit note. 

## 2012-12-07 NOTE — Assessment & Plan Note (Signed)
Wt Readings from Last 3 Encounters:  12/07/12 302 lb (136.986 kg)  09/05/12 297 lb 1.9 oz (134.773 kg)  05/30/12 295 lb (133.811 kg)    

## 2012-12-07 NOTE — Patient Instructions (Signed)
Wt Readings from Last 3 Encounters:  12/07/12 302 lb (136.986 kg)  09/05/12 297 lb 1.9 oz (134.773 kg)  05/30/12 295 lb (133.811 kg)

## 2012-12-07 NOTE — Progress Notes (Signed)
   Subjective:    HPI  C/o LBP w/irrad to L buttock x2 wks. He saw a Land    He lives in [place], [place] now  The patient is here to follow up on chronic ED, anxiety, DVT and chronic moderate fibromyalgia, hypogonadism symptoms controlled with medicines, diet and exercise.  No more episodes of global amnesia x 5 h on Sun 6 wks ago that started during a very hard sex (he took Viagra prior to it). No relapse. No problems w/cialis. C/o wt gain.  Wt Readings from Last 3 Encounters:  12/07/12 302 lb (136.986 kg)  09/05/12 297 lb 1.9 oz (134.773 kg)  05/30/12 295 lb (133.811 kg)   BP Readings from Last 3 Encounters:  12/07/12 120/84  09/05/12 122/76  05/30/12 130/90       Review of Systems  Constitutional: Negative for appetite change, fatigue and unexpected weight change.  HENT: Negative for congestion, nosebleeds, sneezing, sore throat and trouble swallowing.   Eyes: Negative for itching and visual disturbance.  Respiratory: Negative for cough.   Cardiovascular: Negative for chest pain, palpitations and leg swelling.  Gastrointestinal: Negative for nausea, diarrhea, blood in stool and abdominal distention.  Genitourinary: Negative for frequency and hematuria.  Musculoskeletal: Negative for back pain, gait problem, joint swelling and neck pain.  Skin: Negative for rash.  Neurological: Negative for dizziness, tremors, speech difficulty and weakness.  Psychiatric/Behavioral: Negative for sleep disturbance, dysphoric mood and agitation. The patient is not nervous/anxious.        Objective:   Physical Exam  Constitutional: He is oriented to person, place, and time. He appears well-developed.  obese  HENT:  Mouth/Throat: Oropharynx is clear and moist.  Eyes: Conjunctivae are normal. Pupils are equal, round, and reactive to light.  Neck: Normal range of motion. No JVD present. No thyromegaly present.  Cardiovascular: Normal rate, regular rhythm, normal heart sounds and  intact distal pulses.  Exam reveals no gallop and no friction rub.   No murmur heard. Pulmonary/Chest: Effort normal and breath sounds normal. No respiratory distress. He has no wheezes. He has no rales. He exhibits no tenderness.  Abdominal: Soft. Bowel sounds are normal. He exhibits no distension and no mass. There is no tenderness. There is no rebound and no guarding.  Musculoskeletal: Normal range of motion. He exhibits edema. He exhibits no tenderness.  Lymphadenopathy:    He has no cervical adenopathy.  Neurological: He is alert and oriented to person, place, and time. He has normal reflexes. No cranial nerve deficit. He exhibits normal muscle tone. Coordination normal.  Skin: Skin is warm and dry. No rash noted.  Psychiatric: He has a normal mood and affect. His behavior is normal. Judgment and thought content normal.    Lab Results  Component Value Date   WBC 7.7 05/30/2012   HGB 14.7 05/30/2012   HCT 42.3 05/30/2012   PLT 349.0 05/30/2012   GLUCOSE 103* 05/30/2012   CHOL 146 05/30/2012   TRIG 95.0 05/30/2012   HDL 36.60* 05/30/2012   LDLCALC 90 05/30/2012   ALT 25 05/30/2012   AST 27 05/30/2012   NA 137 05/30/2012   K 4.3 05/30/2012   CL 106 05/30/2012   CREATININE 1.0 05/30/2012   BUN 17 05/30/2012   CO2 23 05/30/2012   TSH 1.05 05/30/2012   PSA 0.56 05/30/2012   INR 2.9* 09/05/2012   HGBA1C 6.1 06/17/2010         Assessment & Plan:

## 2012-12-07 NOTE — Assessment & Plan Note (Signed)
Continue with current prescription therapy as reflected on the Med list.  

## 2012-12-08 ENCOUNTER — Ambulatory Visit: Payer: PRIVATE HEALTH INSURANCE

## 2012-12-08 DIAGNOSIS — Z86718 Personal history of other venous thrombosis and embolism: Secondary | ICD-10-CM

## 2012-12-09 LAB — PROTIME-INR
INR: 3.52 — ABNORMAL HIGH (ref <1.50)
Prothrombin Time: 33.5 seconds — ABNORMAL HIGH (ref 11.6–15.2)

## 2012-12-10 ENCOUNTER — Encounter: Payer: Self-pay | Admitting: Internal Medicine

## 2013-02-17 ENCOUNTER — Encounter: Payer: Self-pay | Admitting: Internal Medicine

## 2013-03-08 ENCOUNTER — Other Ambulatory Visit (INDEPENDENT_AMBULATORY_CARE_PROVIDER_SITE_OTHER): Payer: PRIVATE HEALTH INSURANCE

## 2013-03-08 ENCOUNTER — Encounter: Payer: Self-pay | Admitting: Internal Medicine

## 2013-03-08 ENCOUNTER — Ambulatory Visit (INDEPENDENT_AMBULATORY_CARE_PROVIDER_SITE_OTHER): Payer: PRIVATE HEALTH INSURANCE | Admitting: Internal Medicine

## 2013-03-08 VITALS — BP 132/84 | HR 76 | Temp 98.8°F | Resp 16 | Wt 304.0 lb

## 2013-03-08 DIAGNOSIS — Z86718 Personal history of other venous thrombosis and embolism: Secondary | ICD-10-CM

## 2013-03-08 DIAGNOSIS — M1990 Unspecified osteoarthritis, unspecified site: Secondary | ICD-10-CM

## 2013-03-08 DIAGNOSIS — F411 Generalized anxiety disorder: Secondary | ICD-10-CM

## 2013-03-08 DIAGNOSIS — E669 Obesity, unspecified: Secondary | ICD-10-CM

## 2013-03-08 LAB — PROTIME-INR
INR: 3.6 ratio — ABNORMAL HIGH (ref 0.8–1.0)
Prothrombin Time: 37.6 s — ABNORMAL HIGH (ref 10.2–12.4)

## 2013-03-08 MED ORDER — ALPRAZOLAM 1 MG PO TABS: 0.5000 mg | ORAL_TABLET | Freq: Two times a day (BID) | ORAL | Status: DC | PRN

## 2013-03-08 MED ORDER — WARFARIN SODIUM 10 MG PO TABS: 10.0000 mg | ORAL_TABLET | ORAL | Status: DC

## 2013-03-08 NOTE — Assessment & Plan Note (Signed)
Continue with current prescription therapy as reflected on the Med list.  

## 2013-03-08 NOTE — Progress Notes (Signed)
   Subjective:    HPI  F/u LBP w/irrad to L buttock x2 wks. He saw a Restaurant manager, fast food    He lives in [place], [place] now  The patient is here to follow up on chronic ED, anxiety, DVT and chronic moderate fibromyalgia, hypogonadism symptoms controlled with medicines, diet and exercise.  No more episodes of global amnesia x 5 h on Sun 6 wks ago that started during a very hard sex (he took Viagra prior to it). No relapse. No problems w/cialis. C/o wt gain.  Wt Readings from Last 3 Encounters:  03/08/13 304 lb (137.893 kg)  12/07/12 302 lb (136.986 kg)  09/05/12 297 lb 1.9 oz (134.773 kg)   BP Readings from Last 3 Encounters:  03/08/13 132/84  12/07/12 120/84  09/05/12 122/76   Review of Systems  Constitutional: Negative for appetite change, fatigue and unexpected weight change.  HENT: Negative for congestion, nosebleeds, sneezing, sore throat and trouble swallowing.   Eyes: Negative for itching and visual disturbance.  Respiratory: Negative for cough.   Cardiovascular: Negative for chest pain, palpitations and leg swelling.  Gastrointestinal: Negative for nausea, diarrhea, blood in stool and abdominal distention.  Genitourinary: Negative for frequency and hematuria.  Musculoskeletal: Negative for back pain, gait problem, joint swelling and neck pain.  Skin: Negative for rash.  Neurological: Negative for dizziness, tremors, speech difficulty and weakness.  Psychiatric/Behavioral: Negative for sleep disturbance, dysphoric mood and agitation. The patient is not nervous/anxious.        Objective:   Physical Exam  Constitutional: He is oriented to person, place, and time. He appears well-developed.  obese  HENT:  Mouth/Throat: Oropharynx is clear and moist.  Eyes: Conjunctivae are normal. Pupils are equal, round, and reactive to light.  Neck: Normal range of motion. No JVD present. No thyromegaly present.  Cardiovascular: Normal rate, regular rhythm, normal heart sounds and intact  distal pulses.  Exam reveals no gallop and no friction rub.   No murmur heard. Pulmonary/Chest: Effort normal and breath sounds normal. No respiratory distress. He has no wheezes. He has no rales. He exhibits no tenderness.  Abdominal: Soft. Bowel sounds are normal. He exhibits no distension and no mass. There is no tenderness. There is no rebound and no guarding.  Musculoskeletal: Normal range of motion. He exhibits edema. He exhibits no tenderness.  Lymphadenopathy:    He has no cervical adenopathy.  Neurological: He is alert and oriented to person, place, and time. He has normal reflexes. No cranial nerve deficit. He exhibits normal muscle tone. Coordination normal.  Skin: Skin is warm and dry. No rash noted.  Psychiatric: He has a normal mood and affect. His behavior is normal. Judgment and thought content normal.    Lab Results  Component Value Date   WBC 7.7 05/30/2012   HGB 14.7 05/30/2012   HCT 42.3 05/30/2012   PLT 349.0 05/30/2012   GLUCOSE 103* 05/30/2012   CHOL 146 05/30/2012   TRIG 95.0 05/30/2012   HDL 36.60* 05/30/2012   LDLCALC 90 05/30/2012   ALT 25 05/30/2012   AST 27 05/30/2012   NA 137 05/30/2012   K 4.3 05/30/2012   CL 106 05/30/2012   CREATININE 1.0 05/30/2012   BUN 17 05/30/2012   CO2 23 05/30/2012   TSH 1.05 05/30/2012   PSA 0.56 05/30/2012   INR 3.52* 12/08/2012   HGBA1C 6.1 06/17/2010         Assessment & Plan:

## 2013-03-08 NOTE — Progress Notes (Signed)
Pre visit review using our clinic review tool, if applicable. No additional management support is needed unless otherwise documented below in the visit note. 

## 2013-03-08 NOTE — Assessment & Plan Note (Signed)
Wt Readings from Last 3 Encounters:  03/08/13 304 lb (137.893 kg)  12/07/12 302 lb (136.986 kg)  09/05/12 297 lb 1.9 oz (134.773 kg)

## 2013-06-07 ENCOUNTER — Encounter: Payer: Self-pay | Admitting: Internal Medicine

## 2013-06-07 ENCOUNTER — Other Ambulatory Visit (INDEPENDENT_AMBULATORY_CARE_PROVIDER_SITE_OTHER): Payer: PRIVATE HEALTH INSURANCE

## 2013-06-07 ENCOUNTER — Ambulatory Visit (INDEPENDENT_AMBULATORY_CARE_PROVIDER_SITE_OTHER): Payer: PRIVATE HEALTH INSURANCE | Admitting: Internal Medicine

## 2013-06-07 VITALS — BP 130/70 | HR 76 | Temp 97.6°F | Resp 16 | Wt 304.0 lb

## 2013-06-07 DIAGNOSIS — Z86718 Personal history of other venous thrombosis and embolism: Secondary | ICD-10-CM

## 2013-06-07 DIAGNOSIS — I872 Venous insufficiency (chronic) (peripheral): Secondary | ICD-10-CM

## 2013-06-07 DIAGNOSIS — E039 Hypothyroidism, unspecified: Secondary | ICD-10-CM

## 2013-06-07 DIAGNOSIS — E291 Testicular hypofunction: Secondary | ICD-10-CM

## 2013-06-07 DIAGNOSIS — F411 Generalized anxiety disorder: Secondary | ICD-10-CM

## 2013-06-07 LAB — PROTIME-INR
INR: 3.3 ratio — ABNORMAL HIGH (ref 0.8–1.0)
Prothrombin Time: 35.3 s — ABNORMAL HIGH (ref 9.6–13.1)

## 2013-06-07 MED ORDER — ALPRAZOLAM 1 MG PO TABS: 0.5000 mg | ORAL_TABLET | Freq: Two times a day (BID) | ORAL | Status: DC | PRN

## 2013-06-07 MED ORDER — LORCASERIN HCL 10 MG PO TABS: 1.0000 | ORAL_TABLET | Freq: Two times a day (BID) | ORAL | Status: DC

## 2013-06-07 NOTE — Assessment & Plan Note (Signed)
Continue with current prescription therapy as reflected on the Med list.  

## 2013-06-07 NOTE — Progress Notes (Signed)
   Subjective:    HPI  F/u LBP w/irrad to L buttock x2 wks. He saw a Restaurant manager, fast food    He lives in [place], [place] now  The patient is here to follow up on chronic ED, anxiety, DVT and chronic moderate fibromyalgia, hypogonadism symptoms controlled with medicines, diet and exercise.  No more episodes of global amnesia x 5 h on Sun 6 wks ago that started during a very hard sex (he took Viagra prior to it). No relapse. No problems w/cialis. C/o wt gain.  Wt Readings from Last 3 Encounters:  06/07/13 304 lb (137.893 kg)  03/08/13 304 lb (137.893 kg)  12/07/12 302 lb (136.986 kg)   BP Readings from Last 3 Encounters:  06/07/13 130/70  03/08/13 132/84  12/07/12 120/84   Review of Systems  Constitutional: Negative for appetite change, fatigue and unexpected weight change.  HENT: Negative for congestion, nosebleeds, sneezing, sore throat and trouble swallowing.   Eyes: Negative for itching and visual disturbance.  Respiratory: Negative for cough.   Cardiovascular: Negative for chest pain, palpitations and leg swelling.  Gastrointestinal: Negative for nausea, diarrhea, blood in stool and abdominal distention.  Genitourinary: Negative for frequency and hematuria.  Musculoskeletal: Negative for back pain, gait problem, joint swelling and neck pain.  Skin: Negative for rash.  Neurological: Negative for dizziness, tremors, speech difficulty and weakness.  Psychiatric/Behavioral: Negative for sleep disturbance, dysphoric mood and agitation. The patient is not nervous/anxious.        Objective:   Physical Exam  Constitutional: He is oriented to person, place, and time. He appears well-developed.  obese  HENT:  Mouth/Throat: Oropharynx is clear and moist.  Eyes: Conjunctivae are normal. Pupils are equal, round, and reactive to light.  Neck: Normal range of motion. No JVD present. No thyromegaly present.  Cardiovascular: Normal rate, regular rhythm, normal heart sounds and intact distal  pulses.  Exam reveals no gallop and no friction rub.   No murmur heard. Pulmonary/Chest: Effort normal and breath sounds normal. No respiratory distress. He has no wheezes. He has no rales. He exhibits no tenderness.  Abdominal: Soft. Bowel sounds are normal. He exhibits no distension and no mass. There is no tenderness. There is no rebound and no guarding.  Musculoskeletal: Normal range of motion. He exhibits edema. He exhibits no tenderness.  Lymphadenopathy:    He has no cervical adenopathy.  Neurological: He is alert and oriented to person, place, and time. He has normal reflexes. No cranial nerve deficit. He exhibits normal muscle tone. Coordination normal.  Skin: Skin is warm and dry. No rash noted.  Psychiatric: He has a normal mood and affect. His behavior is normal. Judgment and thought content normal.    Lab Results  Component Value Date   WBC 7.7 05/30/2012   HGB 14.7 05/30/2012   HCT 42.3 05/30/2012   PLT 349.0 05/30/2012   GLUCOSE 103* 05/30/2012   CHOL 146 05/30/2012   TRIG 95.0 05/30/2012   HDL 36.60* 05/30/2012   LDLCALC 90 05/30/2012   ALT 25 05/30/2012   AST 27 05/30/2012   NA 137 05/30/2012   K 4.3 05/30/2012   CL 106 05/30/2012   CREATININE 1.0 05/30/2012   BUN 17 05/30/2012   CO2 23 05/30/2012   TSH 1.05 05/30/2012   PSA 0.56 05/30/2012   INR 3.6* 03/08/2013   HGBA1C 6.1 06/17/2010         Assessment & Plan:

## 2013-06-12 NOTE — Assessment & Plan Note (Signed)
Cont wt loss  

## 2013-06-12 NOTE — Assessment & Plan Note (Signed)
[name] decided not to use replacement Rx

## 2013-06-12 NOTE — Assessment & Plan Note (Signed)
Continue with current prn prescription therapy as reflected on the Med list.  

## 2013-06-14 ENCOUNTER — Encounter: Payer: Self-pay | Admitting: Internal Medicine

## 2013-06-15 ENCOUNTER — Encounter: Payer: Self-pay | Admitting: Internal Medicine

## 2013-07-03 ENCOUNTER — Telehealth: Payer: Self-pay | Admitting: *Deleted

## 2013-07-03 NOTE — Telephone Encounter (Signed)
Belviq PA is approved from 06/30/13 until 09/30/2013. Pt informed

## 2013-09-20 ENCOUNTER — Ambulatory Visit (INDEPENDENT_AMBULATORY_CARE_PROVIDER_SITE_OTHER): Payer: PRIVATE HEALTH INSURANCE | Admitting: Internal Medicine

## 2013-09-20 ENCOUNTER — Encounter: Payer: Self-pay | Admitting: Internal Medicine

## 2013-09-20 ENCOUNTER — Other Ambulatory Visit (INDEPENDENT_AMBULATORY_CARE_PROVIDER_SITE_OTHER): Payer: PRIVATE HEALTH INSURANCE

## 2013-09-20 VITALS — BP 138/90 | HR 62 | Temp 98.6°F | Wt 291.2 lb

## 2013-09-20 DIAGNOSIS — Z86718 Personal history of other venous thrombosis and embolism: Secondary | ICD-10-CM

## 2013-09-20 DIAGNOSIS — F411 Generalized anxiety disorder: Secondary | ICD-10-CM

## 2013-09-20 DIAGNOSIS — E039 Hypothyroidism, unspecified: Secondary | ICD-10-CM

## 2013-09-20 DIAGNOSIS — Z23 Encounter for immunization: Secondary | ICD-10-CM

## 2013-09-20 DIAGNOSIS — E669 Obesity, unspecified: Secondary | ICD-10-CM

## 2013-09-20 LAB — PROTIME-INR
INR: 4.7 ratio — ABNORMAL HIGH (ref 0.8–1.0)
Prothrombin Time: 49.6 s — ABNORMAL HIGH (ref 9.6–13.1)

## 2013-09-20 MED ORDER — LORCASERIN HCL 10 MG PO TABS: 1.0000 | ORAL_TABLET | Freq: Two times a day (BID) | ORAL | Status: DC

## 2013-09-20 MED ORDER — ALPRAZOLAM 1 MG PO TABS: 0.5000 mg | ORAL_TABLET | Freq: Two times a day (BID) | ORAL | Status: DC | PRN

## 2013-09-20 NOTE — Assessment & Plan Note (Signed)
Belviq - summer 2015 - feeling good, lost 13 lbs

## 2013-09-20 NOTE — Assessment & Plan Note (Signed)
CHRONIC  Potential benefits of a long term benzodiazepines  use as well as potential risks  and complications were explained to the patient and were aknowledged.  Continue with current prescription therapy as reflected on the Med list.

## 2013-09-20 NOTE — Progress Notes (Signed)
   Subjective:    HPI     He lives in [place], [place] now  The patient is here to follow up on chronic ED, anxiety, DVT and chronic moderate fibromyalgia, hypogonadism symptoms controlled with medicines, diet and exercise.  No more episodes of global amnesia x 5 h on Sun 6 wks ago that started during a very hard sex (he took Viagra prior to it). No relapse. No problems w/cialis. C/o wt gain.  Wt Readings from Last 3 Encounters:  09/20/13 291 lb 3.2 oz (132.087 kg)  06/07/13 304 lb (137.893 kg)  03/08/13 304 lb (137.893 kg)   BP Readings from Last 3 Encounters:  09/20/13 138/90  06/07/13 130/70  03/08/13 132/84   Review of Systems  Constitutional: Negative for appetite change, fatigue and unexpected weight change.  HENT: Negative for congestion, nosebleeds, sneezing, sore throat and trouble swallowing.   Eyes: Negative for itching and visual disturbance.  Respiratory: Negative for cough.   Cardiovascular: Negative for chest pain, palpitations and leg swelling.  Gastrointestinal: Negative for nausea, diarrhea, blood in stool and abdominal distention.  Genitourinary: Negative for frequency and hematuria.  Musculoskeletal: Negative for back pain, gait problem, joint swelling and neck pain.  Skin: Negative for rash.  Neurological: Negative for dizziness, tremors, speech difficulty and weakness.  Psychiatric/Behavioral: Negative for sleep disturbance, dysphoric mood and agitation. The patient is not nervous/anxious.        Objective:   Physical Exam  Constitutional: He is oriented to person, place, and time. He appears well-developed.  obese  HENT:  Mouth/Throat: Oropharynx is clear and moist.  Eyes: Conjunctivae are normal. Pupils are equal, round, and reactive to light.  Neck: Normal range of motion. No JVD present. No thyromegaly present.  Cardiovascular: Normal rate, regular rhythm, normal heart sounds and intact distal pulses.  Exam reveals no gallop and no friction rub.    No murmur heard. Pulmonary/Chest: Effort normal and breath sounds normal. No respiratory distress. He has no wheezes. He has no rales. He exhibits no tenderness.  Abdominal: Soft. Bowel sounds are normal. He exhibits no distension and no mass. There is no tenderness. There is no rebound and no guarding.  Musculoskeletal: Normal range of motion. He exhibits edema. He exhibits no tenderness.  Lymphadenopathy:    He has no cervical adenopathy.  Neurological: He is alert and oriented to person, place, and time. He has normal reflexes. No cranial nerve deficit. He exhibits normal muscle tone. Coordination normal.  Skin: Skin is warm and dry. No rash noted.  Psychiatric: He has a normal mood and affect. His behavior is normal. Judgment and thought content normal.    Lab Results  Component Value Date   WBC 7.7 05/30/2012   HGB 14.7 05/30/2012   HCT 42.3 05/30/2012   PLT 349.0 05/30/2012   GLUCOSE 103* 05/30/2012   CHOL 146 05/30/2012   TRIG 95.0 05/30/2012   HDL 36.60* 05/30/2012   LDLCALC 90 05/30/2012   ALT 25 05/30/2012   AST 27 05/30/2012   NA 137 05/30/2012   K 4.3 05/30/2012   CL 106 05/30/2012   CREATININE 1.0 05/30/2012   BUN 17 05/30/2012   CO2 23 05/30/2012   TSH 1.05 05/30/2012   PSA 0.56 05/30/2012   INR 3.3* 06/07/2013   HGBA1C 6.1 06/17/2010         Assessment & Plan:

## 2013-09-20 NOTE — Assessment & Plan Note (Signed)
On Coumadin 

## 2013-09-20 NOTE — Patient Instructions (Addendum)
Wt Readings from Last 3 Encounters:  09/20/13 291 lb 3.2 oz (132.087 kg)  06/07/13 304 lb (137.893 kg)  03/08/13 304 lb (137.893 kg)   Great job!  Joseph [name]'s diet

## 2013-09-20 NOTE — Assessment & Plan Note (Signed)
Continue with current prescription therapy as reflected on the Med list.  

## 2013-09-20 NOTE — Progress Notes (Signed)
Pre visit review using our clinic review tool, if applicable. No additional management support is needed unless otherwise documented below in the visit note. 

## 2013-09-26 ENCOUNTER — Encounter: Payer: Self-pay | Admitting: Internal Medicine

## 2013-10-09 ENCOUNTER — Encounter: Payer: Self-pay | Admitting: Internal Medicine

## 2013-10-16 ENCOUNTER — Telehealth: Payer: Self-pay | Admitting: *Deleted

## 2013-10-16 NOTE — Telephone Encounter (Signed)
Pt is needing PA on his [employer]...Joseph [name]

## 2013-10-16 NOTE — Telephone Encounter (Signed)
From Cassandria Anger, MD [[id number]] To [name] Composed 10/16/2013 10:19 AM For Delivery On 10/16/2013 10:19 AM Subject RE: Non-Urgent Medical Question Message Type Patient Medical Advice Request Read Status Y By [name] - last viewed at 10:22 AM on 10/16/2013 Message Body Ok  I'll forward it to [place].  Thank you!  AP   ----- Message -----  From: [name]  Sent: 10/09/2013 1:24 PM EDT  To: [name], MD  Subject: Non-Urgent Medical Question   Argh! Need some quick help, Dr. Alain Marion! Insurance is once again demanding "prior authorizarion" for me to get simple refill of Belviq!! I don't know why because I have four refills left my current prescription. I will try to find out what's going on with the insurance company. Thanks!

## 2013-10-16 NOTE — Telephone Encounter (Signed)
PA form faxed to ins. Will wait for decision.

## 2013-10-24 NOTE — Telephone Encounter (Signed)
Belviq PA is approved 10/10/13 until 01/09/2014 per pt.

## 2013-11-01 ENCOUNTER — Encounter: Payer: Self-pay | Admitting: Internal Medicine

## 2013-11-06 ENCOUNTER — Other Ambulatory Visit: Payer: Self-pay | Admitting: *Deleted

## 2013-11-06 MED ORDER — SILDENAFIL CITRATE 100 MG PO TABS: 100.0000 mg | ORAL_TABLET | ORAL | Status: DC | PRN

## 2013-12-20 ENCOUNTER — Other Ambulatory Visit (INDEPENDENT_AMBULATORY_CARE_PROVIDER_SITE_OTHER): Payer: PRIVATE HEALTH INSURANCE

## 2013-12-20 ENCOUNTER — Encounter: Payer: Self-pay | Admitting: Internal Medicine

## 2013-12-20 ENCOUNTER — Ambulatory Visit: Payer: PRIVATE HEALTH INSURANCE | Admitting: Internal Medicine

## 2013-12-20 ENCOUNTER — Ambulatory Visit (INDEPENDENT_AMBULATORY_CARE_PROVIDER_SITE_OTHER): Payer: PRIVATE HEALTH INSURANCE | Admitting: Internal Medicine

## 2013-12-20 VITALS — BP 130/90 | HR 54 | Temp 98.2°F | Ht 75.0 in | Wt 285.0 lb

## 2013-12-20 DIAGNOSIS — Z0000 Encounter for general adult medical examination without abnormal findings: Secondary | ICD-10-CM

## 2013-12-20 DIAGNOSIS — Z7901 Long term (current) use of anticoagulants: Secondary | ICD-10-CM

## 2013-12-20 LAB — CBC WITH DIFFERENTIAL/PLATELET
Basophils Absolute: 0 10*3/uL (ref 0.0–0.1)
Basophils Relative: 0.4 % (ref 0.0–3.0)
Eosinophils Absolute: 0.2 10*3/uL (ref 0.0–0.7)
Eosinophils Relative: 1.5 % (ref 0.0–5.0)
HCT: 44.6 % (ref 39.0–52.0)
Hemoglobin: 14.9 g/dL (ref 13.0–17.0)
Lymphocytes Relative: 20.1 % (ref 12.0–46.0)
Lymphs Abs: 2.1 10*3/uL (ref 0.7–4.0)
MCHC: 33.3 g/dL (ref 30.0–36.0)
MCV: 98 fl (ref 78.0–100.0)
Monocytes Absolute: 0.7 10*3/uL (ref 0.1–1.0)
Monocytes Relative: 6.6 % (ref 3.0–12.0)
Neutro Abs: 7.3 10*3/uL (ref 1.4–7.7)
Neutrophils Relative %: 71.4 % (ref 43.0–77.0)
Platelets: 333 10*3/uL (ref 150.0–400.0)
RBC: 4.55 Mil/uL (ref 4.22–5.81)
RDW: 13 % (ref 11.5–15.5)
WBC: 10.2 10*3/uL (ref 4.0–10.5)

## 2013-12-20 LAB — URINALYSIS
Bilirubin Urine: NEGATIVE
Hgb urine dipstick: NEGATIVE
Ketones, ur: NEGATIVE
Leukocytes, UA: NEGATIVE
Nitrite: NEGATIVE
Specific Gravity, Urine: 1.025 (ref 1.000–1.030)
Total Protein, Urine: NEGATIVE
Urine Glucose: NEGATIVE
Urobilinogen, UA: 0.2 (ref 0.0–1.0)
pH: 6 (ref 5.0–8.0)

## 2013-12-20 LAB — PROTIME-INR
INR: 2.7 ratio — ABNORMAL HIGH (ref 0.8–1.0)
Prothrombin Time: 29.4 s — ABNORMAL HIGH (ref 9.6–13.1)

## 2013-12-20 MED ORDER — LEVOTHYROXINE SODIUM 25 MCG PO TABS: 25.0000 ug | ORAL_TABLET | Freq: Every day | ORAL | Status: DC

## 2013-12-20 MED ORDER — LORCASERIN HCL 10 MG PO TABS
1.0000 | ORAL_TABLET | Freq: Two times a day (BID) | ORAL | Status: DC
Start: 2013-12-20 — End: 2013-12-20

## 2013-12-20 MED ORDER — LORCASERIN HCL 10 MG PO TABS: 1.0000 | ORAL_TABLET | Freq: Two times a day (BID) | ORAL | Status: DC

## 2013-12-20 NOTE — Progress Notes (Signed)
Subjective:    HPI  The patient is here for a wellness exam. The patient has been doing well overall without major physical or psychological issues going on lately   He lives in [place], [place] now  The patient is here to follow up on chronic ED, anxiety, DVT and chronic moderate fibromyalgia, hypogonadism symptoms controlled with medicines, diet and exercise.  No more episodes of global amnesia x 5 h on Sun 6 wks ago that started during a very hard sex (he took Viagra prior to it). No relapse. No problems w/cialis. F/u wt gain - loosing wt on Belviq.  Wt Readings from Last 3 Encounters:  12/20/13 285 lb (129.275 kg)  09/20/13 291 lb 3.2 oz (132.087 kg)  06/07/13 304 lb (137.893 kg)   BP Readings from Last 3 Encounters:  12/20/13 130/90  09/20/13 138/90  06/07/13 130/70   Review of Systems  Constitutional: Negative for appetite change, fatigue and unexpected weight change.  HENT: Negative for congestion, nosebleeds, sneezing, sore throat and trouble swallowing.   Eyes: Negative for itching and visual disturbance.  Respiratory: Negative for cough.   Cardiovascular: Negative for chest pain, palpitations and leg swelling.  Gastrointestinal: Negative for nausea, diarrhea, blood in stool and abdominal distention.  Genitourinary: Negative for frequency and hematuria.  Musculoskeletal: Negative for back pain, joint swelling, gait problem and neck pain.  Skin: Negative for rash.  Neurological: Negative for dizziness, tremors, speech difficulty and weakness.  Psychiatric/Behavioral: Negative for sleep disturbance, dysphoric mood and agitation. The patient is not nervous/anxious.        Objective:   Physical Exam  Constitutional: He is oriented to person, place, and time. He appears well-developed. No distress.  NAD  HENT:  Mouth/Throat: Oropharynx is clear and moist.  Eyes: Conjunctivae are normal. Pupils are equal, round, and reactive to light.  Neck: Normal range of motion. No  JVD present. No thyromegaly present.  Cardiovascular: Normal rate, regular rhythm, normal heart sounds and intact distal pulses.  Exam reveals no gallop and no friction rub.   No murmur heard. Pulmonary/Chest: Effort normal and breath sounds normal. No respiratory distress. He has no wheezes. He has no rales. He exhibits no tenderness.  Abdominal: Soft. Bowel sounds are normal. He exhibits no distension and no mass. There is no tenderness. There is no rebound and no guarding.  Musculoskeletal: Normal range of motion. He exhibits no edema or tenderness.  Lymphadenopathy:    He has no cervical adenopathy.  Neurological: He is alert and oriented to person, place, and time. He has normal reflexes. No cranial nerve deficit. He exhibits normal muscle tone. He displays a negative Romberg sign. Coordination and gait normal.  No meningeal signs  Skin: Skin is warm and dry. No rash noted.  Psychiatric: He has a normal mood and affect. His behavior is normal. Judgment and thought content normal.    Lab Results  Component Value Date   WBC 7.7 05/30/2012   HGB 14.7 05/30/2012   HCT 42.3 05/30/2012   PLT 349.0 05/30/2012   GLUCOSE 103* 05/30/2012   CHOL 146 05/30/2012   TRIG 95.0 05/30/2012   HDL 36.60* 05/30/2012   LDLCALC 90 05/30/2012   ALT 25 05/30/2012   AST 27 05/30/2012   NA 137 05/30/2012   K 4.3 05/30/2012   CL 106 05/30/2012   CREATININE 1.0 05/30/2012   BUN 17 05/30/2012   CO2 23 05/30/2012   TSH 1.05 05/30/2012   PSA 0.56 05/30/2012   INR 4.7* 09/20/2013  HGBA1C 6.1 06/17/2010         Assessment & Plan:

## 2013-12-20 NOTE — Patient Instructions (Addendum)
Google "paleo blueprint"  Wt Readings from Last 3 Encounters:  12/20/13 285 lb (129.275 kg)  09/20/13 291 lb 3.2 oz (132.087 kg)  06/07/13 304 lb (137.893 kg)    BP Readings from Last 3 Encounters:  12/20/13 130/90  09/20/13 138/90  06/07/13 130/70

## 2013-12-20 NOTE — Progress Notes (Signed)
Pre visit review using our clinic review tool, if applicable. No additional management support is needed unless otherwise documented below in the visit note. 

## 2013-12-21 LAB — BASIC METABOLIC PANEL
BUN: 15 mg/dL (ref 6–23)
CO2: 25 mEq/L (ref 19–32)
Calcium: 9.1 mg/dL (ref 8.4–10.5)
Chloride: 103 mEq/L (ref 96–112)
Creatinine, Ser: 1.1 mg/dL (ref 0.4–1.5)
GFR: 72.53 mL/min (ref >60.00)
Glucose, Bld: 102 mg/dL — ABNORMAL HIGH (ref 70–99)
Potassium: 4.6 mEq/L (ref 3.5–5.1)
Sodium: 133 mEq/L — ABNORMAL LOW (ref 135–145)

## 2013-12-21 LAB — HEPATIC FUNCTION PANEL
ALT: 19 U/L (ref 0–53)
AST: 20 U/L (ref 0–37)
Albumin: 4.1 g/dL (ref 3.5–5.2)
Alkaline Phosphatase: 62 U/L (ref 39–117)
Bilirubin, Direct: 0.1 mg/dL (ref 0.0–0.3)
Total Bilirubin: 0.5 mg/dL (ref 0.2–1.2)
Total Protein: 6.7 g/dL (ref 6.0–8.3)

## 2013-12-21 LAB — LIPID PANEL
Cholesterol: 142 mg/dL (ref 0–200)
HDL: 38.1 mg/dL — ABNORMAL LOW (ref >39.00)
LDL Cholesterol: 84 mg/dL (ref 0–99)
NonHDL: 103.9
Total CHOL/HDL Ratio: 4
Triglycerides: 99 mg/dL (ref 0.0–149.0)
VLDL: 19.8 mg/dL (ref 0.0–40.0)

## 2013-12-21 LAB — TSH: TSH: 1.25 u[IU]/mL (ref 0.35–4.50)

## 2013-12-21 LAB — PSA: PSA: 0.73 ng/mL (ref 0.10–4.00)

## 2013-12-22 ENCOUNTER — Telehealth: Payer: Self-pay

## 2013-12-22 NOTE — Telephone Encounter (Signed)
PA for belviq started via cover my meds.

## 2014-01-03 NOTE — Telephone Encounter (Signed)
rec fax on 12/29/13 stating PA is approved from 01/10/2014 until 04/11/2014 . Received another fax on 01/01/14 stating PA was cancelled. I left detailed mess informing pt to call me back to see what he knows.

## 2014-01-05 NOTE — Telephone Encounter (Signed)
Pt left detailed mess informing me is was aware of approval and thanked [place] for our help and time.

## 2014-02-04 ENCOUNTER — Other Ambulatory Visit: Payer: Self-pay | Admitting: Internal Medicine

## 2014-02-05 ENCOUNTER — Other Ambulatory Visit: Payer: Self-pay

## 2014-02-05 MED ORDER — LORCASERIN HCL 10 MG PO TABS: 1.0000 | ORAL_TABLET | Freq: Two times a day (BID) | ORAL | Status: DC

## 2014-02-28 ENCOUNTER — Encounter: Payer: Self-pay | Admitting: Internal Medicine

## 2014-02-28 MED ORDER — LEVOTHYROXINE SODIUM 25 MCG PO TABS: 25.0000 ug | ORAL_TABLET | Freq: Every day | ORAL | Status: DC

## 2014-03-28 ENCOUNTER — Encounter: Payer: Self-pay | Admitting: Internal Medicine

## 2014-03-28 ENCOUNTER — Ambulatory Visit (INDEPENDENT_AMBULATORY_CARE_PROVIDER_SITE_OTHER): Payer: PRIVATE HEALTH INSURANCE | Admitting: Internal Medicine

## 2014-03-28 ENCOUNTER — Other Ambulatory Visit (INDEPENDENT_AMBULATORY_CARE_PROVIDER_SITE_OTHER): Payer: PRIVATE HEALTH INSURANCE

## 2014-03-28 VITALS — BP 138/78 | HR 65 | Temp 97.6°F | Wt 281.8 lb

## 2014-03-28 DIAGNOSIS — F411 Generalized anxiety disorder: Secondary | ICD-10-CM

## 2014-03-28 DIAGNOSIS — E034 Atrophy of thyroid (acquired): Secondary | ICD-10-CM

## 2014-03-28 DIAGNOSIS — Z86718 Personal history of other venous thrombosis and embolism: Secondary | ICD-10-CM

## 2014-03-28 DIAGNOSIS — E038 Other specified hypothyroidism: Secondary | ICD-10-CM

## 2014-03-28 DIAGNOSIS — R1032 Left lower quadrant pain: Secondary | ICD-10-CM | POA: Insufficient documentation

## 2014-03-28 DIAGNOSIS — Z7901 Long term (current) use of anticoagulants: Secondary | ICD-10-CM

## 2014-03-28 LAB — PROTIME-INR
INR: 2.5 ratio — ABNORMAL HIGH (ref 0.8–1.0)
Prothrombin Time: 27 s — ABNORMAL HIGH (ref 9.6–13.1)

## 2014-03-28 MED ORDER — LORCASERIN HCL 10 MG PO TABS: 1.0000 | ORAL_TABLET | Freq: Two times a day (BID) | ORAL | Status: DC

## 2014-03-28 MED ORDER — LEVOTHYROXINE SODIUM 25 MCG PO TABS: 25.0000 ug | ORAL_TABLET | Freq: Every day | ORAL | Status: DC

## 2014-03-28 MED ORDER — ALPRAZOLAM 1 MG PO TABS: 0.5000 mg | ORAL_TABLET | Freq: Two times a day (BID) | ORAL | Status: DC | PRN

## 2014-03-28 NOTE — Assessment & Plan Note (Signed)
Cont Levothroid

## 2014-03-28 NOTE — Progress Notes (Signed)
Subjective:    HPI   C/o LLQ pain and freq urination on 3/8 - resolved  He lives in [place], [place] now  The patient is here to follow up on chronic ED, anxiety, DVT and chronic moderate fibromyalgia, hypogonadism symptoms controlled with medicines, diet and exercise.  No more episodes of global amnesia x 5 h on Sun 6 wks ago that started during a very hard sex (he took Viagra prior to it). No relapse. No problems w/cialis. F/u wt gain - loosing wt on Belviq.   Coumadin 10 mg/d   Wt Readings from Last 3 Encounters:  03/28/14 281 lb 12 oz (127.801 kg)  12/20/13 285 lb (129.275 kg)  09/20/13 291 lb 3.2 oz (132.087 kg)   BP Readings from Last 3 Encounters:  03/28/14 138/78  12/20/13 130/90  09/20/13 138/90   Review of Systems  Constitutional: Negative for appetite change, fatigue and unexpected weight change.  HENT: Negative for congestion, nosebleeds, sneezing, sore throat and trouble swallowing.   Eyes: Negative for itching and visual disturbance.  Respiratory: Negative for cough.   Cardiovascular: Negative for chest pain, palpitations and leg swelling.  Gastrointestinal: Negative for nausea, diarrhea, blood in stool and abdominal distention.  Genitourinary: Negative for frequency and hematuria.  Musculoskeletal: Negative for back pain, joint swelling, gait problem and neck pain.  Skin: Negative for rash.  Neurological: Negative for dizziness, tremors, speech difficulty and weakness.  Psychiatric/Behavioral: Negative for sleep disturbance, dysphoric mood and agitation. The patient is not nervous/anxious.        Objective:   Physical Exam  Constitutional: He is oriented to person, place, and time. He appears well-developed. No distress.  NAD  HENT:  Mouth/Throat: Oropharynx is clear and moist.  Eyes: Conjunctivae are normal. Pupils are equal, round, and reactive to light.  Neck: Normal range of motion. No JVD present. No thyromegaly present.  Cardiovascular: Normal  rate, regular rhythm, normal heart sounds and intact distal pulses.  Exam reveals no gallop and no friction rub.   No murmur heard. Pulmonary/Chest: Effort normal and breath sounds normal. No respiratory distress. He has no wheezes. He has no rales. He exhibits no tenderness.  Abdominal: Soft. Bowel sounds are normal. He exhibits no distension and no mass. There is no tenderness. There is no rebound and no guarding.  Musculoskeletal: Normal range of motion. He exhibits no edema or tenderness.  Lymphadenopathy:    He has no cervical adenopathy.  Neurological: He is alert and oriented to person, place, and time. He has normal reflexes. No cranial nerve deficit. He exhibits normal muscle tone. He displays a negative Romberg sign. Coordination and gait normal.  No meningeal signs  Skin: Skin is warm and dry. No rash noted.  Psychiatric: He has a normal mood and affect. His behavior is normal. Judgment and thought content normal.    Lab Results  Component Value Date   WBC 10.2 12/20/2013   HGB 14.9 12/20/2013   HCT 44.6 12/20/2013   PLT 333.0 12/20/2013   GLUCOSE 102* 12/20/2013   CHOL 142 12/20/2013   TRIG 99.0 12/20/2013   HDL 38.10* 12/20/2013   LDLCALC 84 12/20/2013   ALT 19 12/20/2013   AST 20 12/20/2013   NA 133* 12/20/2013   K 4.6 12/20/2013   CL 103 12/20/2013   CREATININE 1.1 12/20/2013   BUN 15 12/20/2013   CO2 25 12/20/2013   TSH 1.25 12/20/2013   PSA 0.73 12/20/2013   INR 2.7* 12/20/2013   HGBA1C 6.1 06/17/2010  Assessment & Plan:

## 2014-03-28 NOTE — Assessment & Plan Note (Signed)
On Coumadin 

## 2014-03-28 NOTE — Progress Notes (Signed)
Pre visit review using our clinic review tool, if applicable. No additional management support is needed unless otherwise documented below in the visit note. 

## 2014-03-28 NOTE — Patient Instructions (Signed)
Wt Readings from Last 3 Encounters:  03/28/14 281 lb 12 oz (127.801 kg)  12/20/13 285 lb (129.275 kg)  09/20/13 291 lb 3.2 oz (132.087 kg)

## 2014-03-28 NOTE — Assessment & Plan Note (Signed)
03/27/14 ?kidney stone - resolved UA

## 2014-03-28 NOTE — Assessment & Plan Note (Signed)
  On Alprazolam prn

## 2014-04-16 ENCOUNTER — Encounter: Payer: Self-pay | Admitting: Internal Medicine

## 2014-04-18 ENCOUNTER — Telehealth: Payer: Self-pay

## 2014-04-18 NOTE — Telephone Encounter (Signed)
Received pharmacy rejection stating that insurance will not cover belviq without a prior authorization. Called [facility] [phone], answered clinical questions, medication now approved through 07/18/14.

## 2014-05-01 ENCOUNTER — Other Ambulatory Visit: Payer: Self-pay | Admitting: Internal Medicine

## 2014-05-05 ENCOUNTER — Encounter: Payer: Self-pay | Admitting: Internal Medicine

## 2014-05-07 ENCOUNTER — Encounter: Payer: Self-pay | Admitting: Internal Medicine

## 2014-06-27 ENCOUNTER — Other Ambulatory Visit (INDEPENDENT_AMBULATORY_CARE_PROVIDER_SITE_OTHER): Payer: PRIVATE HEALTH INSURANCE

## 2014-06-27 ENCOUNTER — Ambulatory Visit (INDEPENDENT_AMBULATORY_CARE_PROVIDER_SITE_OTHER): Payer: PRIVATE HEALTH INSURANCE | Admitting: Internal Medicine

## 2014-06-27 ENCOUNTER — Encounter: Payer: Self-pay | Admitting: Internal Medicine

## 2014-06-27 VITALS — BP 118/88 | HR 57 | Wt 273.0 lb

## 2014-06-27 DIAGNOSIS — I872 Venous insufficiency (chronic) (peripheral): Secondary | ICD-10-CM

## 2014-06-27 DIAGNOSIS — F411 Generalized anxiety disorder: Secondary | ICD-10-CM | POA: Diagnosis not present

## 2014-06-27 DIAGNOSIS — E039 Hypothyroidism, unspecified: Secondary | ICD-10-CM | POA: Diagnosis not present

## 2014-06-27 DIAGNOSIS — Z86718 Personal history of other venous thrombosis and embolism: Secondary | ICD-10-CM

## 2014-06-27 DIAGNOSIS — Z7901 Long term (current) use of anticoagulants: Secondary | ICD-10-CM | POA: Diagnosis not present

## 2014-06-27 LAB — PROTIME-INR
INR: 1.6 ratio — ABNORMAL HIGH (ref 0.8–1.0)
Prothrombin Time: 17.4 s — ABNORMAL HIGH (ref 9.6–13.1)

## 2014-06-27 MED ORDER — LORCASERIN HCL 10 MG PO TABS: 1.0000 | ORAL_TABLET | Freq: Two times a day (BID) | ORAL | Status: DC

## 2014-06-27 MED ORDER — LEVOTHYROXINE SODIUM 25 MCG PO TABS: 25.0000 ug | ORAL_TABLET | Freq: Every day | ORAL | Status: DC

## 2014-06-27 MED ORDER — WARFARIN SODIUM 10 MG PO TABS: ORAL_TABLET | ORAL | Status: DC

## 2014-06-27 NOTE — Progress Notes (Signed)
Pre visit review using our clinic review tool, if applicable. No additional management support is needed unless otherwise documented below in the visit note. 

## 2014-06-27 NOTE — Progress Notes (Signed)
Subjective:    HPI   F/u obesity, leg swelling, venous insufficiency  Joseph [name] lives in [place], [place] now  The patient is here to follow up on chronic ED, anxiety, DVT and chronic moderate fibromyalgia, hypogonadism symptoms controlled with medicines, diet and exercise.  No more episodes of global amnesia x 5 h on Sun 6 wks ago that started during a very hard sex (he took Viagra prior to it). No relapse. No problems w/cialis. Pt is loosing wt on Belviq: doing great on it - he lost >30 lbs  Coumadin 10 mg/d   Wt Readings from Last 3 Encounters:  06/27/14 273 lb (123.832 kg)  03/28/14 281 lb 12 oz (127.801 kg)  12/20/13 285 lb (129.275 kg)   BP Readings from Last 3 Encounters:  06/27/14 118/88  03/28/14 138/78  12/20/13 130/90   Review of Systems  Constitutional: Negative for appetite change, fatigue and unexpected weight change.  HENT: Negative for congestion, nosebleeds, sneezing, sore throat and trouble swallowing.   Eyes: Negative for itching and visual disturbance.  Respiratory: Negative for cough.   Cardiovascular: Negative for chest pain, palpitations and leg swelling.  Gastrointestinal: Negative for nausea, diarrhea, blood in stool and abdominal distention.  Genitourinary: Negative for frequency and hematuria.  Musculoskeletal: Negative for back pain, joint swelling, gait problem and neck pain.  Skin: Negative for rash.  Neurological: Negative for dizziness, tremors, speech difficulty and weakness.  Psychiatric/Behavioral: Negative for sleep disturbance, dysphoric mood and agitation. The patient is not nervous/anxious.        Objective:   Physical Exam  Constitutional: He is oriented to person, place, and time. He appears well-developed. No distress.  NAD  HENT:  Mouth/Throat: Oropharynx is clear and moist.  Eyes: Conjunctivae are normal. Pupils are equal, round, and reactive to light.  Neck: Normal range of motion. No JVD present. No thyromegaly present.   Cardiovascular: Normal rate, regular rhythm, normal heart sounds and intact distal pulses.  Exam reveals no gallop and no friction rub.   No murmur heard. Pulmonary/Chest: Effort normal and breath sounds normal. No respiratory distress. He has no wheezes. He has no rales. He exhibits no tenderness.  Abdominal: Soft. Bowel sounds are normal. He exhibits no distension and no mass. There is no tenderness. There is no rebound and no guarding.  Musculoskeletal: Normal range of motion. He exhibits no edema or tenderness.  Lymphadenopathy:    He has no cervical adenopathy.  Neurological: He is alert and oriented to person, place, and time. He has normal reflexes. No cranial nerve deficit. He exhibits normal muscle tone. He displays a negative Romberg sign. Coordination and gait normal.  No meningeal signs  Skin: Skin is warm and dry. No rash noted.  Psychiatric: He has a normal mood and affect. His behavior is normal. Judgment and thought content normal.  trace swelling RLE  Lab Results  Component Value Date   WBC 10.2 12/20/2013   HGB 14.9 12/20/2013   HCT 44.6 12/20/2013   PLT 333.0 12/20/2013   GLUCOSE 102* 12/20/2013   CHOL 142 12/20/2013   TRIG 99.0 12/20/2013   HDL 38.10* 12/20/2013   LDLCALC 84 12/20/2013   ALT 19 12/20/2013   AST 20 12/20/2013   NA 133* 12/20/2013   K 4.6 12/20/2013   CL 103 12/20/2013   CREATININE 1.1 12/20/2013   BUN 15 12/20/2013   CO2 25 12/20/2013   TSH 1.25 12/20/2013   PSA 0.73 12/20/2013   INR 2.5* 03/28/2014   HGBA1C 6.1  06/17/2010         Assessment & Plan:

## 2014-06-28 ENCOUNTER — Encounter: Payer: Self-pay | Admitting: Internal Medicine

## 2014-07-01 NOTE — Assessment & Plan Note (Signed)
On Levoxyl 

## 2014-07-01 NOTE — Assessment & Plan Note (Signed)
CHRONIC On Xanax prn  Potential benefits of a long term benzodiazepines  use as well as potential risks  and complications were explained to the patient and were aknowledged.

## 2014-07-01 NOTE — Assessment & Plan Note (Signed)
Recurrent On Coumadin  Potential benefits of a long term Coumadin  use as well as potential risks  and complications were explained to the patient and were aknowledged.

## 2014-07-01 NOTE — Assessment & Plan Note (Signed)
Doing better w/wt loss

## 2014-07-05 ENCOUNTER — Telehealth: Payer: Self-pay

## 2014-07-05 NOTE — Telephone Encounter (Signed)
PA for Belviq has been started via covermymeds.

## 2014-07-06 NOTE — Telephone Encounter (Signed)
PA for Belviq approved until 09/2014

## 2014-07-06 NOTE — Telephone Encounter (Signed)
Pt informed that PA was approved. 

## 2014-10-03 ENCOUNTER — Other Ambulatory Visit (INDEPENDENT_AMBULATORY_CARE_PROVIDER_SITE_OTHER): Payer: PRIVATE HEALTH INSURANCE

## 2014-10-03 ENCOUNTER — Encounter: Payer: Self-pay | Admitting: Internal Medicine

## 2014-10-03 ENCOUNTER — Ambulatory Visit (INDEPENDENT_AMBULATORY_CARE_PROVIDER_SITE_OTHER): Payer: PRIVATE HEALTH INSURANCE | Admitting: Internal Medicine

## 2014-10-03 VITALS — BP 110/60 | HR 52 | Wt 277.0 lb

## 2014-10-03 DIAGNOSIS — Z0000 Encounter for general adult medical examination without abnormal findings: Secondary | ICD-10-CM

## 2014-10-03 DIAGNOSIS — E034 Atrophy of thyroid (acquired): Secondary | ICD-10-CM

## 2014-10-03 DIAGNOSIS — M79671 Pain in right foot: Secondary | ICD-10-CM | POA: Diagnosis not present

## 2014-10-03 DIAGNOSIS — Z23 Encounter for immunization: Secondary | ICD-10-CM | POA: Diagnosis not present

## 2014-10-03 DIAGNOSIS — F411 Generalized anxiety disorder: Secondary | ICD-10-CM | POA: Diagnosis not present

## 2014-10-03 DIAGNOSIS — Z7901 Long term (current) use of anticoagulants: Secondary | ICD-10-CM

## 2014-10-03 DIAGNOSIS — E669 Obesity, unspecified: Secondary | ICD-10-CM

## 2014-10-03 DIAGNOSIS — E038 Other specified hypothyroidism: Secondary | ICD-10-CM

## 2014-10-03 LAB — PROTIME-INR
INR: 3.3 ratio — ABNORMAL HIGH (ref 0.8–1.0)
Prothrombin Time: 35.1 s — ABNORMAL HIGH (ref 9.6–13.1)

## 2014-10-03 MED ORDER — ALPRAZOLAM 1 MG PO TABS: 0.5000 mg | ORAL_TABLET | Freq: Two times a day (BID) | ORAL | Status: DC | PRN

## 2014-10-03 MED ORDER — LORCASERIN HCL 10 MG PO TABS: 1.0000 | ORAL_TABLET | Freq: Two times a day (BID) | ORAL | Status: DC

## 2014-10-03 NOTE — Assessment & Plan Note (Signed)
Chronic On Levoxyl Labs

## 2014-10-03 NOTE — Progress Notes (Signed)
Pre visit review using our clinic review tool, if applicable. No additional management support is needed unless otherwise documented below in the visit note. 

## 2014-10-03 NOTE — Assessment & Plan Note (Signed)
On Xanax prn  Potential benefits of a long term benzodiazepines use as well as potential risks  and complications were explained to the patient and were aknowledged. 

## 2014-10-03 NOTE — Assessment & Plan Note (Signed)
Overall well w/Belviq

## 2014-10-03 NOTE — Assessment & Plan Note (Signed)
R big toe is tender w/a toenail remnant tender R leg is shorter Dr Tamala Julian

## 2014-10-03 NOTE — Progress Notes (Signed)
Subjective:  Patient ID: Joseph [name], male    DOB: Oct 17, 1950  Age: 64 y.o. MRN: [id number]  CC: No chief complaint on file.   HPI SAMSON RALPH presents for DVT/anticoagulation, anxiety, obesity f/u.  Outpatient Prescriptions Prior to Visit  Medication Sig Dispense Refill  . Cholecalciferol 1000 UNITS tablet Take 1,000 Units by mouth daily.      Marland Kitchen levothyroxine (SYNTHROID, LEVOTHROID) 25 MCG tablet Take 1 tablet (25 mcg total) by mouth daily. 90 tablet 3  . sildenafil (VIAGRA) 100 MG tablet Take 1 tablet (100 mg total) by mouth as needed for erectile dysfunction. 80 tablet 3  . warfarin (COUMADIN) 10 MG tablet TAKE 1 TABLET BY MOUTH AS DIRECTED, THEN 1&1/2 TABLET ON MONDAY AND FRIDAY AND 1 TABLET ON ALL OTHER DAYS. 189 tablet 1  . ALPRAZolam (XANAX) 1 MG tablet Take 0.5-1 tablets (0.5-1 mg total) by mouth 2 (two) times daily as needed for anxiety. 180 tablet 1  . Lorcaserin HCl (BELVIQ) 10 MG TABS Take 1 tablet by mouth 2 (two) times daily. 60 tablet 3   No facility-administered medications prior to visit.    ROS Review of Systems  Constitutional: Negative for appetite change, fatigue and unexpected weight change.  HENT: Negative for congestion, nosebleeds, sneezing, sore throat and trouble swallowing.   Eyes: Negative for itching and visual disturbance.  Respiratory: Negative for cough and wheezing.   Cardiovascular: Negative for chest pain, palpitations and leg swelling.  Gastrointestinal: Negative for nausea, diarrhea, blood in stool and abdominal distention.  Genitourinary: Negative for frequency and hematuria.  Musculoskeletal: Positive for back pain. Negative for joint swelling, gait problem and neck pain.  Skin: Negative for rash.  Neurological: Negative for dizziness, tremors, speech difficulty and weakness.  Psychiatric/Behavioral: Negative for suicidal ideas, sleep disturbance, dysphoric mood and agitation. The patient is nervous/anxious.     Objective:  BP  110/60 mmHg  Pulse 52  Wt 277 lb (125.646 kg)  SpO2 96%  BP Readings from Last 3 Encounters:  10/03/14 110/60  06/27/14 118/88  03/28/14 138/78    Wt Readings from Last 3 Encounters:  10/03/14 277 lb (125.646 kg)  06/27/14 273 lb (123.832 kg)  03/28/14 281 lb 12 oz (127.801 kg)    Physical Exam  Constitutional: He is oriented to person, place, and time. He appears well-developed. No distress.  NAD  HENT:  Mouth/Throat: Oropharynx is clear and moist.  Eyes: Conjunctivae are normal. Pupils are equal, round, and reactive to light.  Neck: Normal range of motion. No JVD present. No thyromegaly present.  Cardiovascular: Normal rate, regular rhythm, normal heart sounds and intact distal pulses.  Exam reveals no gallop and no friction rub.   No murmur heard. Pulmonary/Chest: Effort normal and breath sounds normal. No respiratory distress. He has no wheezes. He has no rales. He exhibits no tenderness.  Abdominal: Soft. Bowel sounds are normal. He exhibits no distension and no mass. There is no tenderness. There is no rebound and no guarding.  Musculoskeletal: Normal range of motion. He exhibits tenderness. He exhibits no edema.  Lymphadenopathy:    He has no cervical adenopathy.  Neurological: He is alert and oriented to person, place, and time. He has normal reflexes. No cranial nerve deficit. He exhibits normal muscle tone. He displays a negative Romberg sign. Coordination and gait normal.  Skin: Skin is warm and dry. No rash noted.  Psychiatric: He has a normal mood and affect. His behavior is normal. Judgment and thought content normal.  Obese R foot w/edema R big toe is tender w/a toenail remnant tender R leg is shorter  Lab Results  Component Value Date   WBC 10.2 12/20/2013   HGB 14.9 12/20/2013   HCT 44.6 12/20/2013   PLT 333.0 12/20/2013   GLUCOSE 102* 12/20/2013   CHOL 142 12/20/2013   TRIG 99.0 12/20/2013   HDL 38.10* 12/20/2013   LDLCALC 84 12/20/2013   ALT 19  12/20/2013   AST 20 12/20/2013   NA 133* 12/20/2013   K 4.6 12/20/2013   CL 103 12/20/2013   CREATININE 1.1 12/20/2013   BUN 15 12/20/2013   CO2 25 12/20/2013   TSH 1.25 12/20/2013   PSA 0.73 12/20/2013   INR 3.3* 10/03/2014   HGBA1C 6.1 06/17/2010    No results found.  Assessment & Plan:   Diagnoses and all orders for this visit:  Hypothyroidism due to acquired atrophy of thyroid -     T4, free; Future  Obesity -     T4, free; Future  Right foot pain -     Ambulatory referral to Sports Medicine -     Uric acid; Future  Anxiety state  Well adult exam -     TSH; Future -     T4, free; Future -     Uric acid; Future -     CBC with Differential/Platelet; Future -     Basic metabolic panel; Future -     Lipid panel; Future -     PSA; Future -     Urinalysis; Future  Need for influenza vaccination -     Flu Vaccine QUAD 36+ mos IM  Other orders -     ALPRAZolam (XANAX) 1 MG tablet; Take 0.5-1 tablets (0.5-1 mg total) by mouth 2 (two) times daily as needed for anxiety. -     Lorcaserin HCl (BELVIQ) 10 MG TABS; Take 1 tablet by mouth 2 (two) times daily.   I am having Mr. Atchley maintain his Cholecalciferol, sildenafil, warfarin, levothyroxine, ALPRAZolam, and Lorcaserin HCl.  Meds ordered this encounter  Medications  . ALPRAZolam (XANAX) 1 MG tablet    Sig: Take 0.5-1 tablets (0.5-1 mg total) by mouth 2 (two) times daily as needed for anxiety.    Dispense:  180 tablet    Refill:  1  . Lorcaserin HCl (BELVIQ) 10 MG TABS    Sig: Take 1 tablet by mouth 2 (two) times daily.    Dispense:  60 tablet    Refill:  3     Follow-up: Return in about 3 months (around 01/02/2015) for Wellness Exam.  [name], MD

## 2014-10-18 ENCOUNTER — Telehealth: Payer: Self-pay | Admitting: *Deleted

## 2014-10-18 NOTE — Telephone Encounter (Signed)
Belviq PA is approved 10/16/14 until 10/16/15. Pt's pharmacy informed.

## 2014-11-18 ENCOUNTER — Other Ambulatory Visit: Payer: Self-pay | Admitting: Internal Medicine

## 2014-12-05 ENCOUNTER — Other Ambulatory Visit: Payer: Self-pay | Admitting: Internal Medicine

## 2014-12-05 NOTE — Telephone Encounter (Signed)
Ok to refill 

## 2015-01-02 ENCOUNTER — Encounter: Payer: Self-pay | Admitting: Internal Medicine

## 2015-01-02 ENCOUNTER — Ambulatory Visit (INDEPENDENT_AMBULATORY_CARE_PROVIDER_SITE_OTHER): Payer: PRIVATE HEALTH INSURANCE | Admitting: Family Medicine

## 2015-01-02 ENCOUNTER — Encounter: Payer: Self-pay | Admitting: Family Medicine

## 2015-01-02 ENCOUNTER — Ambulatory Visit (INDEPENDENT_AMBULATORY_CARE_PROVIDER_SITE_OTHER): Payer: PRIVATE HEALTH INSURANCE | Admitting: Internal Medicine

## 2015-01-02 ENCOUNTER — Other Ambulatory Visit (INDEPENDENT_AMBULATORY_CARE_PROVIDER_SITE_OTHER): Payer: PRIVATE HEALTH INSURANCE

## 2015-01-02 VITALS — BP 118/82 | HR 58 | Ht 75.0 in | Wt 267.0 lb

## 2015-01-02 DIAGNOSIS — E669 Obesity, unspecified: Secondary | ICD-10-CM

## 2015-01-02 DIAGNOSIS — F411 Generalized anxiety disorder: Secondary | ICD-10-CM

## 2015-01-02 DIAGNOSIS — I872 Venous insufficiency (chronic) (peripheral): Secondary | ICD-10-CM | POA: Diagnosis not present

## 2015-01-02 DIAGNOSIS — E038 Other specified hypothyroidism: Secondary | ICD-10-CM | POA: Diagnosis not present

## 2015-01-02 DIAGNOSIS — M79671 Pain in right foot: Secondary | ICD-10-CM

## 2015-01-02 DIAGNOSIS — E034 Atrophy of thyroid (acquired): Secondary | ICD-10-CM

## 2015-01-02 DIAGNOSIS — Z0000 Encounter for general adult medical examination without abnormal findings: Secondary | ICD-10-CM | POA: Diagnosis not present

## 2015-01-02 LAB — CBC WITH DIFFERENTIAL/PLATELET
Basophils Absolute: 0 10*3/uL (ref 0.0–0.1)
Basophils Relative: 0.4 % (ref 0.0–3.0)
Eosinophils Absolute: 0.1 10*3/uL (ref 0.0–0.7)
Eosinophils Relative: 1.2 % (ref 0.0–5.0)
HCT: 46 % (ref 39.0–52.0)
Hemoglobin: 15.5 g/dL (ref 13.0–17.0)
Lymphocytes Relative: 23.7 % (ref 12.0–46.0)
Lymphs Abs: 2.2 10*3/uL (ref 0.7–4.0)
MCHC: 33.8 g/dL (ref 30.0–36.0)
MCV: 96.9 fl (ref 78.0–100.0)
Monocytes Absolute: 0.8 10*3/uL (ref 0.1–1.0)
Monocytes Relative: 8 % (ref 3.0–12.0)
Neutro Abs: 6.3 10*3/uL (ref 1.4–7.7)
Neutrophils Relative %: 66.7 % (ref 43.0–77.0)
Platelets: 392 10*3/uL (ref 150.0–400.0)
RBC: 4.74 Mil/uL (ref 4.22–5.81)
RDW: 13 % (ref 11.5–15.5)
WBC: 9.4 10*3/uL (ref 4.0–10.5)

## 2015-01-02 LAB — URINALYSIS
Bilirubin Urine: NEGATIVE
Hgb urine dipstick: NEGATIVE
Ketones, ur: NEGATIVE
Leukocytes, UA: NEGATIVE
Nitrite: NEGATIVE
Specific Gravity, Urine: 1.025 (ref 1.000–1.030)
Total Protein, Urine: NEGATIVE
Urine Glucose: NEGATIVE
Urobilinogen, UA: 0.2 (ref 0.0–1.0)
pH: 5.5 (ref 5.0–8.0)

## 2015-01-02 LAB — BASIC METABOLIC PANEL
BUN: 19 mg/dL (ref 6–23)
CO2: 28 mEq/L (ref 19–32)
Calcium: 9.4 mg/dL (ref 8.4–10.5)
Chloride: 100 mEq/L (ref 96–112)
Creatinine, Ser: 1.2 mg/dL (ref 0.40–1.50)
GFR: 64.7 mL/min (ref >60.00)
Glucose, Bld: 100 mg/dL — ABNORMAL HIGH (ref 70–99)
Potassium: 3.9 mEq/L (ref 3.5–5.1)
Sodium: 137 mEq/L (ref 135–145)

## 2015-01-02 LAB — LIPID PANEL
Cholesterol: 158 mg/dL (ref 0–200)
HDL: 58.4 mg/dL (ref >39.00)
LDL Cholesterol: 81 mg/dL (ref 0–99)
NonHDL: 99.22
Total CHOL/HDL Ratio: 3
Triglycerides: 91 mg/dL (ref 0.0–149.0)
VLDL: 18.2 mg/dL (ref 0.0–40.0)

## 2015-01-02 LAB — T4, FREE: Free T4: 0.98 ng/dL (ref 0.60–1.60)

## 2015-01-02 LAB — PSA: PSA: 0.52 ng/mL (ref 0.10–4.00)

## 2015-01-02 LAB — TSH: TSH: 2.72 u[IU]/mL (ref 0.35–4.50)

## 2015-01-02 LAB — URIC ACID: Uric Acid, Serum: 6.9 mg/dL (ref 4.0–7.8)

## 2015-01-02 MED ORDER — LORCASERIN HCL 10 MG PO TABS: 1.0000 | ORAL_TABLET | Freq: Two times a day (BID) | ORAL | Status: DC

## 2015-01-02 NOTE — Assessment & Plan Note (Signed)
Wt loss Sock Elevation

## 2015-01-02 NOTE — Progress Notes (Signed)
[facility] [address] [place], [place] [address] Phone: [phone] Subjective:    I'm seeing this patient by the request  of:  [name], MD   CC: Right foot pain  [id number] Joseph [name] is a 64 y.o. male coming in with complaint of right foot pain. Patient states that he is had this pain for quite some time. Patient multiple years ago was having significant amount swelling and was diagnosed with a deep venous thrombosis. Patient did have a thrombolysis done. Never  though he has had some mild discomfort and swelling of the lower extremity. Seems to be worsening over the course of time. His only unilateral. States that there is some mild discomfort and pain but not all the time. Seems to get better when he gets off the foot. Patient denies any redness. In any signs of infection. No significant numbness but notices severe discoloration of his toes when he is standing a long amount of time. States that the large toe never gets warm and seems to stay more and more white. Rates the severity is 6 out of 10. Has tried changing shoes with minimal benefit. Wears compression socks daily to help with the swelling.  Past Medical History  Diagnosis Date  . DVT of leg (deep venous thrombosis) ([place])   . Warfarin anticoagulation   . Osteoarthritis   . ED (erectile dysfunction)   . Obesity   . Hypothyroidism 2011  . Anxiety    Past Surgical History  Procedure Laterality Date  . Hernia repair    . Hernia repair    . Dvt   Lysis  . Colonoscopy  01/02/2011    Procedure: COLONOSCOPY;  Surgeon: Inda Castle, MD;  Location: [facility];  Service: Endoscopy;  Laterality: N/A;   Social History  Substance Use Topics  . Smoking status: Former Research scientist (life sciences)  . Smokeless tobacco: None  . Alcohol Use: 2.4 oz/week    4 Glasses of wine per week   Allergies  Allergen Reactions  . Erythromycin     REACTION: nausea   Family History  Problem Relation Age of  Onset  . Coronary artery disease Other   . Anesthesia problems Neg Hx   . Malignant hyperthermia Neg Hx   . Heart disease Father 59    MI        Past medical history, social, surgical and family history all reviewed in electronic medical record.   Review of Systems: No headache, visual changes, nausea, vomiting, diarrhea, constipation, dizziness, abdominal pain, skin rash, fevers, chills, night sweats, weight loss, swollen lymph nodes, body aches, joint swelling, muscle aches, chest pain, shortness of breath, mood changes.   Objective Blood pressure 118/82, pulse 58, height 6\' 3"  (1.905 m), weight 267 lb (121.11 kg), SpO2 96 %.  General: No apparent distress alert and oriented x3 mood and affect normal, dressed appropriately. Obese.  HEENT: Pupils equal, extraocular movements intact  Respiratory: Patient's speak in full sentences and does not appear short of breath  Cardiovascular: +1lower extremity edema, non tender, no erythema  Skin: Warm dry intact with no signs of infection or rash on extremities or on axial skeleton.  Abdomen: Soft nontender  Neuro: Cranial nerves II through XII are intact, neurovascularly intact in all extremities with 2+ DTRs and 2+ pulses.  Lymph: No lymphadenopathy of posterior or anterior cervical chain or axillae bilaterally.  Gait normal with good balance and coordination.  MSK:  Non tender with full range of motion  and good stability and symmetric strength and tone of shoulders, elbows, wrist, hip, knees bilaterally. Patient's ankle show that patient does have blue discoloration as well as some paleness to the right ankle compared to the contralateral side. Patient has poor capillary refill of the toes especially the large toe.to palpation. Patient does seem to be urologic leg intact. Patient has lost the large toenail. Trace swelling noted bilaterally.     Impression and Recommendations:     This case required medical decision making of moderate  complexity.

## 2015-01-02 NOTE — Patient Instructions (Signed)
Good to see you I think most of this is vascular.  We will consider an ABI at a later date.  Wear socks to bed Consider pneumatic compression stockings at home Keep moving Good shoes with rigid bottom.  Jalene Mullet, [place]rell or New balance greater then 700 or xerelo could be great.  [employer] orthotics total support online and only put it in your right shoe Otherwise if you want heel lift look at [email or web address] If anything worsens you know where I am  Happy holidays!

## 2015-01-02 NOTE — Progress Notes (Signed)
Subjective:  Patient ID: Joseph [name], male    DOB: [birth year]  Age: 64 y.o. MRN: [id number]  CC: Annual Exam   HPI Joseph [name] presents for a well exam. Loosing wt on Belviq.  Outpatient Prescriptions Prior to Visit  Medication Sig Dispense Refill  . ALPRAZolam (XANAX) 1 MG tablet Take 0.5-1 tablets (0.5-1 mg total) by mouth 2 (two) times daily as needed for anxiety. 180 tablet 1  . Cholecalciferol 1000 UNITS tablet Take 1,000 Units by mouth daily.      Marland Kitchen levothyroxine (SYNTHROID, LEVOTHROID) 25 MCG tablet Take 1 tablet (25 mcg total) by mouth daily. 90 tablet 3  . sildenafil (VIAGRA) 100 MG tablet Take 1 tablet (100 mg total) by mouth as needed for erectile dysfunction. 80 tablet 3  . warfarin (COUMADIN) 10 MG tablet TAKE 1 TABLET BY MOUTH AS DIRECTED, THEN 1&1/2 TABLET ON MONDAY AND FRIDAY AND 1 TABLET ON ALL OTHER DAYS. 120 tablet 0  . BELVIQ 10 MG TABS TAKE 1 TABLET BY MOUTH TWICE DAILY 60 tablet 5   No facility-administered medications prior to visit.    ROS Review of Systems  Constitutional: Negative for appetite change, fatigue and unexpected weight change.  HENT: Negative for congestion, nosebleeds, sneezing, sore throat and trouble swallowing.   Eyes: Negative for itching and visual disturbance.  Respiratory: Negative for cough.   Cardiovascular: Negative for chest pain, palpitations and leg swelling.  Gastrointestinal: Negative for nausea, diarrhea, blood in stool and abdominal distention.  Genitourinary: Negative for frequency and hematuria.  Musculoskeletal: Negative for back pain, joint swelling, gait problem and neck pain.  Skin: Negative for rash.  Neurological: Negative for dizziness, tremors, speech difficulty and weakness.  Psychiatric/Behavioral: Negative for suicidal ideas, sleep disturbance, dysphoric mood and agitation. The patient is not nervous/anxious.     Objective:  BP 118/82 mmHg  Pulse 58  Ht 6\' 3"  (1.905 m)  Wt 267 lb (121.11 kg)  BMI  33.37 kg/m2  SpO2 97%  BP Readings from Last 3 Encounters:  01/02/15 118/82  01/02/15 118/82  10/03/14 110/60    Wt Readings from Last 3 Encounters:  01/02/15 267 lb (121.11 kg)  01/02/15 267 lb (121.11 kg)  10/03/14 277 lb (125.646 kg)    Physical Exam  Constitutional: He is oriented to person, place, and time. He appears well-developed and well-nourished. No distress.  HENT:  Head: Normocephalic and atraumatic.  Right Ear: External ear normal.  Left Ear: External ear normal.  Nose: Nose normal.  Mouth/Throat: Oropharynx is clear and moist. No oropharyngeal exudate.  Eyes: Conjunctivae and EOM are normal. Pupils are equal, round, and reactive to light. Right eye exhibits no discharge. Left eye exhibits no discharge. No scleral icterus.  Neck: Normal range of motion. Neck supple. No JVD present. No tracheal deviation present. No thyromegaly present.  Cardiovascular: Normal rate, regular rhythm, normal heart sounds and intact distal pulses.  Exam reveals no gallop and no friction rub.   No murmur heard. Pulmonary/Chest: Effort normal and breath sounds normal. No stridor. No respiratory distress. He has no wheezes. He has no rales. He exhibits no tenderness.  Abdominal: Soft. Bowel sounds are normal. He exhibits no distension and no mass. There is no tenderness. There is no rebound and no guarding.  Genitourinary: Rectum normal, prostate normal and penis normal. Guaiac negative stool. No penile tenderness.  Musculoskeletal: Normal range of motion. He exhibits no edema or tenderness.  Lymphadenopathy:    He has no cervical adenopathy.  Neurological:  He is alert and oriented to person, place, and time. He has normal reflexes. No cranial nerve deficit. He exhibits normal muscle tone. Coordination normal.  Skin: Skin is warm and dry. No rash noted. He is not diaphoretic. No erythema. No pallor.  Psychiatric: He has a normal mood and affect. His behavior is normal. Judgment and thought  content normal.  Obese  Lab Results  Component Value Date   WBC 9.4 01/02/2015   HGB 15.5 01/02/2015   HCT 46.0 01/02/2015   PLT 392.0 01/02/2015   GLUCOSE 100* 01/02/2015   CHOL 158 01/02/2015   TRIG 91.0 01/02/2015   HDL 58.40 01/02/2015   LDLCALC 81 01/02/2015   ALT 19 12/20/2013   AST 20 12/20/2013   NA 137 01/02/2015   K 3.9 01/02/2015   CL 100 01/02/2015   CREATININE 1.20 01/02/2015   BUN 19 01/02/2015   CO2 28 01/02/2015   TSH 2.72 01/02/2015   PSA 0.52 01/02/2015   INR 3.3* 10/03/2014   HGBA1C 6.1 06/17/2010    No results found.  Assessment & Plan:   There are no diagnoses linked to this encounter. I have changed Mr. Joseph [name] to Lorcaserin HCl. I am also having him maintain his Cholecalciferol, sildenafil, levothyroxine, ALPRAZolam, and warfarin.  Meds ordered this encounter  Medications  . Lorcaserin HCl (BELVIQ) 10 MG TABS    Sig: Take 1 tablet by mouth 2 (two) times daily.    Dispense:  60 tablet    Refill:  5     Follow-up: Return in about 3 months (around 04/02/2015) for a follow-up visit.  [name], MD

## 2015-01-02 NOTE — Assessment & Plan Note (Signed)
We discussed age appropriate health related issues, including available/recomended screening tests and vaccinations. We discussed a need for adhering to healthy diet and exercise. Labs/EKG were reviewed/ordered. All questions were answered.   

## 2015-01-02 NOTE — Assessment & Plan Note (Addendum)
Dr Tamala Julian: consultation today

## 2015-01-02 NOTE — Progress Notes (Signed)
Pre visit review using our clinic review tool, if applicable. No additional management support is needed unless otherwise documented below in the visit note. 

## 2015-01-02 NOTE — Assessment & Plan Note (Signed)
On Xanax prn  Potential benefits of a long term benzodiazepines use as well as potential risks  and complications were explained to the patient and were aknowledged. 

## 2015-01-02 NOTE — Assessment & Plan Note (Signed)
On Levoxyl 

## 2015-01-02 NOTE — Assessment & Plan Note (Signed)
I do believe the patient's pain and discomfort in his foot is secondary to more of a venous insufficiency cousin some swelling. Believe that there is some vascular compromise as well. Patient has poor capillary refill. Patient does have coolness to touch of the skin as well as a paleness. We discussed the possibility of an ABI. Patient states that if he did have a low blood flow though he would not do any intervention at this time. Patient wanted to hold off. We discussed pneumatic compressions, custom orthotics potentially, proper shoes, as well as wearing socks most of the time to help with circulation. We discussed the possibility of a medication which patient also declined. Patient is going to try this conservative therapy and then follow-up with me again in 4-6 weeks.

## 2015-01-08 ENCOUNTER — Telehealth: Payer: Self-pay

## 2015-01-08 NOTE — Telephone Encounter (Signed)
PA initiated via covermymeds. PA key is [id number]

## 2015-01-17 ENCOUNTER — Telehealth: Payer: Self-pay

## 2015-01-17 NOTE — Telephone Encounter (Signed)
Fax was sent stating that medication PA had been cancelled and the medication did not need any prior authorization. Called and notified pharmacy they sent it through. Called patient as well.

## 2015-02-08 ENCOUNTER — Telehealth: Payer: Self-pay

## 2015-02-08 NOTE — Telephone Encounter (Signed)
PA initiated via covermymeds. Key for PA is [id number]

## 2015-02-14 NOTE — Telephone Encounter (Signed)
Rec fax from [facility] stating [facility] PA is approved from 02/08/2015 until 02/08/2016.

## 2015-04-07 ENCOUNTER — Encounter: Payer: Self-pay | Admitting: Internal Medicine

## 2015-04-08 ENCOUNTER — Other Ambulatory Visit: Payer: Self-pay | Admitting: Internal Medicine

## 2015-04-08 MED ORDER — SILDENAFIL CITRATE 100 MG PO TABS: 100.0000 mg | ORAL_TABLET | ORAL | Status: DC | PRN

## 2015-04-08 NOTE — Telephone Encounter (Signed)
Faxed script to [facility] @ [phone]...Joseph [name]

## 2015-04-08 NOTE — Telephone Encounter (Signed)
Faxed script to [facility].../lmb 

## 2015-04-24 ENCOUNTER — Encounter: Payer: Self-pay | Admitting: Internal Medicine

## 2015-04-24 ENCOUNTER — Other Ambulatory Visit (INDEPENDENT_AMBULATORY_CARE_PROVIDER_SITE_OTHER): Payer: PRIVATE HEALTH INSURANCE

## 2015-04-24 ENCOUNTER — Ambulatory Visit (INDEPENDENT_AMBULATORY_CARE_PROVIDER_SITE_OTHER): Payer: PRIVATE HEALTH INSURANCE | Admitting: Internal Medicine

## 2015-04-24 VITALS — BP 126/76 | HR 53 | Temp 97.8°F | Ht 75.0 in | Wt 276.0 lb

## 2015-04-24 DIAGNOSIS — F411 Generalized anxiety disorder: Secondary | ICD-10-CM | POA: Diagnosis not present

## 2015-04-24 DIAGNOSIS — Z86718 Personal history of other venous thrombosis and embolism: Secondary | ICD-10-CM

## 2015-04-24 DIAGNOSIS — Z7901 Long term (current) use of anticoagulants: Secondary | ICD-10-CM | POA: Diagnosis not present

## 2015-04-24 DIAGNOSIS — E038 Other specified hypothyroidism: Secondary | ICD-10-CM | POA: Diagnosis not present

## 2015-04-24 DIAGNOSIS — E034 Atrophy of thyroid (acquired): Secondary | ICD-10-CM

## 2015-04-24 LAB — PROTIME-INR
INR: 2.8 ratio — ABNORMAL HIGH (ref 0.8–1.0)
Prothrombin Time: 29.8 s — ABNORMAL HIGH (ref 9.6–13.1)

## 2015-04-24 MED ORDER — ALPRAZOLAM 1 MG PO TABS: 0.5000 mg | ORAL_TABLET | Freq: Two times a day (BID) | ORAL | Status: DC | PRN

## 2015-04-24 MED ORDER — WARFARIN SODIUM 10 MG PO TABS: ORAL_TABLET | ORAL | Status: DC

## 2015-04-24 NOTE — Assessment & Plan Note (Signed)
On Xanax prn  Potential benefits of a long term benzodiazepines use as well as potential risks  and complications were explained to the patient and were aknowledged. 

## 2015-04-24 NOTE — Progress Notes (Signed)
Subjective:  Patient ID: Joseph [name], male    DOB: [birth year]  Age: 65 y.o. MRN: [id number]  CC: No chief complaint on file.   HPI Joseph [name] presents for hypothyroidism, obesity and anticoagulation f/u  Outpatient Prescriptions Prior to Visit  Medication Sig Dispense Refill  . BELVIQ 10 MG TABS TAKE 1 TABLET BY MOUTH TWICE DAILY 60 tablet 2  . levothyroxine (SYNTHROID, LEVOTHROID) 25 MCG tablet Take 1 tablet (25 mcg total) by mouth daily. 90 tablet 3  . sildenafil (VIAGRA) 100 MG tablet Take 1 tablet (100 mg total) by mouth as needed for erectile dysfunction. 80 tablet 0  . ALPRAZolam (XANAX) 1 MG tablet Take 0.5-1 tablets (0.5-1 mg total) by mouth 2 (two) times daily as needed for anxiety. 180 tablet 1  . warfarin (COUMADIN) 10 MG tablet TAKE 1 TABLET BY MOUTH AS DIRECTED, THEN 1&1/2 TABLET ON MONDAY AND FRIDAY AND 1 TABLET ON ALL OTHER DAYS. 120 tablet 0  . Cholecalciferol 1000 UNITS tablet Take 1,000 Units by mouth daily. Reported on 04/24/2015     No facility-administered medications prior to visit.    ROS Review of Systems  Constitutional: Negative for appetite change, fatigue and unexpected weight change.  HENT: Negative for congestion, nosebleeds, sinus pressure, sneezing, sore throat and trouble swallowing.   Eyes: Negative for itching and visual disturbance.  Respiratory: Negative for cough.   Cardiovascular: Negative for chest pain, palpitations and leg swelling.  Gastrointestinal: Negative for nausea, diarrhea, blood in stool and abdominal distention.  Genitourinary: Negative for frequency and hematuria.  Musculoskeletal: Negative for back pain, joint swelling, gait problem and neck pain.  Skin: Negative for rash.  Neurological: Negative for dizziness, tremors, speech difficulty and weakness.  Psychiatric/Behavioral: Positive for suicidal ideas. Negative for sleep disturbance, dysphoric mood and agitation. The patient is not nervous/anxious.     Objective:    BP 126/76 mmHg  Pulse 53  Temp(Src) 97.8 F (36.6 C) (Oral)  Ht 6\' 3"  (1.905 m)  Wt 276 lb (125.193 kg)  BMI 34.50 kg/m2  SpO2 96%  BP Readings from Last 3 Encounters:  04/24/15 126/76  01/02/15 118/82  01/02/15 118/82    Wt Readings from Last 3 Encounters:  04/24/15 276 lb (125.193 kg)  01/02/15 267 lb (121.11 kg)  01/02/15 267 lb (121.11 kg)    Physical Exam  Constitutional: He is oriented to person, place, and time. He appears well-developed. No distress.  NAD  HENT:  Mouth/Throat: Oropharynx is clear and moist.  Eyes: Conjunctivae are normal. Pupils are equal, round, and reactive to light.  Neck: Normal range of motion. No JVD present. No thyromegaly present.  Cardiovascular: Normal rate, regular rhythm, normal heart sounds and intact distal pulses.  Exam reveals no gallop and no friction rub.   No murmur heard. Pulmonary/Chest: Effort normal and breath sounds normal. No respiratory distress. He has no wheezes. He has no rales. He exhibits no tenderness.  Abdominal: Soft. Bowel sounds are normal. He exhibits no distension and no mass. There is no tenderness. There is no rebound and no guarding.  Musculoskeletal: Normal range of motion. He exhibits no edema or tenderness.  Lymphadenopathy:    He has no cervical adenopathy.  Neurological: He is alert and oriented to person, place, and time. He has normal reflexes. No cranial nerve deficit. He exhibits normal muscle tone. He displays a negative Romberg sign. Coordination and gait normal.  Skin: Skin is warm and dry. No rash noted.  Psychiatric: He has a  normal mood and affect. His behavior is normal. Judgment and thought content normal.  Obese Limping a little  Lab Results  Component Value Date   WBC 9.4 01/02/2015   HGB 15.5 01/02/2015   HCT 46.0 01/02/2015   PLT 392.0 01/02/2015   GLUCOSE 100* 01/02/2015   CHOL 158 01/02/2015   TRIG 91.0 01/02/2015   HDL 58.40 01/02/2015   LDLCALC 81 01/02/2015   ALT 19  12/20/2013   AST 20 12/20/2013   NA 137 01/02/2015   K 3.9 01/02/2015   CL 100 01/02/2015   CREATININE 1.20 01/02/2015   BUN 19 01/02/2015   CO2 28 01/02/2015   TSH 2.72 01/02/2015   PSA 0.52 01/02/2015   INR 2.8* 04/24/2015   HGBA1C 6.1 06/17/2010    No results found.  Assessment & Plan:   Diagnoses and all orders for this visit:  Hypothyroidism due to acquired atrophy of thyroid  Generalized anxiety disorder  DVT, HX OF  Other orders -     warfarin (COUMADIN) 10 MG tablet; TAKE 1 TABLET BY MOUTH AS DIRECTED, THEN 1&1/2 TABLET ON MONDAY AND FRIDAY AND 1 TABLET ON ALL OTHER DAYS. -     ALPRAZolam (XANAX) 1 MG tablet; Take 0.5-1 tablets (0.5-1 mg total) by mouth 2 (two) times daily as needed for anxiety.  I am having [name] maintain his Cholecalciferol, levothyroxine, sildenafil, BELVIQ, warfarin, and ALPRAZolam.  Meds ordered this encounter  Medications  . warfarin (COUMADIN) 10 MG tablet    Sig: TAKE 1 TABLET BY MOUTH AS DIRECTED, THEN 1&1/2 TABLET ON MONDAY AND FRIDAY AND 1 TABLET ON ALL OTHER DAYS.    Dispense:  120 tablet    Refill:  3  . ALPRAZolam (XANAX) 1 MG tablet    Sig: Take 0.5-1 tablets (0.5-1 mg total) by mouth 2 (two) times daily as needed for anxiety.    Dispense:  180 tablet    Refill:  1     Follow-up: Return in about 3 months (around 07/24/2015) for a follow-up visit.  [name], MD

## 2015-04-24 NOTE — Assessment & Plan Note (Signed)
On Coumadin ? Potential benefits of a long term Coumadin use as well as potential risks  and complications were explained to the patient and were aknowledged. ?

## 2015-04-24 NOTE — Progress Notes (Signed)
Pre visit review using our clinic review tool, if applicable. No additional management support is needed unless otherwise documented below in the visit note. 

## 2015-04-24 NOTE — Assessment & Plan Note (Signed)
On Levoxyl 

## 2015-07-31 ENCOUNTER — Encounter: Payer: Self-pay | Admitting: Internal Medicine

## 2015-07-31 ENCOUNTER — Ambulatory Visit (INDEPENDENT_AMBULATORY_CARE_PROVIDER_SITE_OTHER): Payer: Medicare Other | Admitting: Internal Medicine

## 2015-07-31 ENCOUNTER — Other Ambulatory Visit (INDEPENDENT_AMBULATORY_CARE_PROVIDER_SITE_OTHER): Payer: Medicare Other

## 2015-07-31 VITALS — BP 130/80 | HR 60 | Wt 291.0 lb

## 2015-07-31 DIAGNOSIS — F528 Other sexual dysfunction not due to a substance or known physiological condition: Secondary | ICD-10-CM | POA: Diagnosis not present

## 2015-07-31 DIAGNOSIS — R600 Localized edema: Secondary | ICD-10-CM

## 2015-07-31 DIAGNOSIS — Z7901 Long term (current) use of anticoagulants: Secondary | ICD-10-CM | POA: Diagnosis not present

## 2015-07-31 DIAGNOSIS — E034 Atrophy of thyroid (acquired): Secondary | ICD-10-CM

## 2015-07-31 DIAGNOSIS — F411 Generalized anxiety disorder: Secondary | ICD-10-CM

## 2015-07-31 DIAGNOSIS — I872 Venous insufficiency (chronic) (peripheral): Secondary | ICD-10-CM | POA: Diagnosis not present

## 2015-07-31 DIAGNOSIS — E038 Other specified hypothyroidism: Secondary | ICD-10-CM

## 2015-07-31 DIAGNOSIS — G5701 Lesion of sciatic nerve, right lower limb: Secondary | ICD-10-CM

## 2015-07-31 LAB — PROTIME-INR
INR: 2.3 ratio — ABNORMAL HIGH (ref 0.8–1.0)
Prothrombin Time: 24.4 s — ABNORMAL HIGH (ref 9.6–13.1)

## 2015-07-31 MED ORDER — TIZANIDINE HCL 4 MG PO TABS: 4.0000 mg | ORAL_TABLET | Freq: Two times a day (BID) | ORAL | Status: DC | PRN

## 2015-07-31 MED ORDER — LEVOTHYROXINE SODIUM 25 MCG PO TABS: 25.0000 ug | ORAL_TABLET | Freq: Every day | ORAL | Status: DC

## 2015-07-31 MED ORDER — SILDENAFIL CITRATE 100 MG PO TABS: 100.0000 mg | ORAL_TABLET | ORAL | Status: DC | PRN

## 2015-07-31 MED ORDER — WARFARIN SODIUM 10 MG PO TABS: ORAL_TABLET | ORAL | Status: DC

## 2015-07-31 NOTE — Assessment & Plan Note (Signed)
On Coumadin 

## 2015-07-31 NOTE — Assessment & Plan Note (Addendum)
Tizanidine prn See exercises Body pillow

## 2015-07-31 NOTE — Assessment & Plan Note (Signed)
Viagra prn 

## 2015-07-31 NOTE — Assessment & Plan Note (Signed)
Worse w/wt gain Wt loss is needed

## 2015-07-31 NOTE — Progress Notes (Signed)
Subjective:  Patient ID: Joseph [name], male    DOB: 1950/12/07  Age: 65 y.o. MRN: [id number]  CC: No chief complaint on file.   HPI Joseph [name] presents for  C/o R buttock and hip pain - worse w/sleeping on the R side   Outpatient Prescriptions Prior to Visit  Medication Sig Dispense Refill  . ALPRAZolam (XANAX) 1 MG tablet Take 0.5-1 tablets (0.5-1 mg total) by mouth 2 (two) times daily as needed for anxiety. 180 tablet 1  . BELVIQ 10 MG TABS TAKE 1 TABLET BY MOUTH TWICE DAILY 60 tablet 2  . Cholecalciferol 1000 UNITS tablet Take 1,000 Units by mouth daily. Reported on 04/24/2015    . levothyroxine (SYNTHROID, LEVOTHROID) 25 MCG tablet Take 1 tablet (25 mcg total) by mouth daily. 90 tablet 3  . sildenafil (VIAGRA) 100 MG tablet Take 1 tablet (100 mg total) by mouth as needed for erectile dysfunction. 80 tablet 0  . warfarin (COUMADIN) 10 MG tablet TAKE 1 TABLET BY MOUTH AS DIRECTED, THEN 1&1/2 TABLET ON MONDAY AND FRIDAY AND 1 TABLET ON ALL OTHER DAYS. 120 tablet 3   No facility-administered medications prior to visit.    ROS Review of Systems  Constitutional: Negative for appetite change, fatigue and unexpected weight change.  HENT: Negative for congestion, nosebleeds, sneezing, sore throat and trouble swallowing.   Eyes: Negative for itching and visual disturbance.  Respiratory: Negative for cough.   Cardiovascular: Negative for chest pain, palpitations and leg swelling.  Gastrointestinal: Negative for nausea, diarrhea, blood in stool and abdominal distention.  Genitourinary: Negative for frequency and hematuria.  Musculoskeletal: Positive for back pain and gait problem. Negative for joint swelling and neck pain.  Skin: Negative for rash.  Neurological: Negative for dizziness, tremors, speech difficulty and weakness.  Psychiatric/Behavioral: Negative for suicidal ideas, sleep disturbance, dysphoric mood and agitation. The patient is not nervous/anxious.     Objective:    BP 130/80 mmHg  Pulse 60  Wt 291 lb (131.997 kg)  SpO2 96%  BP Readings from Last 3 Encounters:  07/31/15 130/80  04/24/15 126/76  01/02/15 118/82    Wt Readings from Last 3 Encounters:  07/31/15 291 lb (131.997 kg)  04/24/15 276 lb (125.193 kg)  01/02/15 267 lb (121.11 kg)    Physical Exam  Constitutional: He is oriented to person, place, and time. He appears well-developed. No distress.  NAD  HENT:  Mouth/Throat: Oropharynx is clear and moist.  Eyes: Conjunctivae are normal. Pupils are equal, round, and reactive to light.  Neck: Normal range of motion. No JVD present. No thyromegaly present.  Cardiovascular: Normal rate, regular rhythm, normal heart sounds and intact distal pulses.  Exam reveals no gallop and no friction rub.   No murmur heard. Pulmonary/Chest: Effort normal and breath sounds normal. No respiratory distress. He has no wheezes. He has no rales. He exhibits no tenderness.  Abdominal: Soft. Bowel sounds are normal. He exhibits no distension and no mass. There is no tenderness. There is no rebound and no guarding.  Musculoskeletal: Normal range of motion. He exhibits no edema or tenderness.  Lymphadenopathy:    He has no cervical adenopathy.  Neurological: He is alert and oriented to person, place, and time. He has normal reflexes. No cranial nerve deficit. He exhibits normal muscle tone. He displays a negative Romberg sign. Coordination and gait normal.  Skin: Skin is warm and dry. No rash noted.  Psychiatric: He has a normal mood and affect. His behavior is normal.  Judgment and thought content normal.  R buttock is tender RLE w/edema Hips NT Str leg elev (-) B  Lab Results  Component Value Date   WBC 9.4 01/02/2015   HGB 15.5 01/02/2015   HCT 46.0 01/02/2015   PLT 392.0 01/02/2015   GLUCOSE 100* 01/02/2015   CHOL 158 01/02/2015   TRIG 91.0 01/02/2015   HDL 58.40 01/02/2015   LDLCALC 81 01/02/2015   ALT 19 12/20/2013   AST 20 12/20/2013   NA 137  01/02/2015   K 3.9 01/02/2015   CL 100 01/02/2015   CREATININE 1.20 01/02/2015   BUN 19 01/02/2015   CO2 28 01/02/2015   TSH 2.72 01/02/2015   PSA 0.52 01/02/2015   INR 2.8* 04/24/2015   HGBA1C 6.1 06/17/2010    No results found.  Assessment & Plan:   There are no diagnoses linked to this encounter. I am having Mr. Beyers maintain his Cholecalciferol, levothyroxine, sildenafil, BELVIQ, warfarin, and ALPRAZolam.  No orders of the defined types were placed in this encounter.     Follow-up: No Follow-up on file.  [name], MD

## 2015-07-31 NOTE — Assessment & Plan Note (Signed)
Wt loss discussed 

## 2015-07-31 NOTE — Assessment & Plan Note (Signed)
On Levoxyl 

## 2015-07-31 NOTE — Patient Instructions (Signed)
Piriformis Syndrome With Rehab Piriformis syndrome is a condition the affects the nervous system in the area of the hip, and is characterized by pain and possibly a loss of feeling in the backside (posterior) thigh that may extend down the entire length of the leg. The symptoms are caused by an increase in pressure on the sciatic nerve by the piriformis muscle, which is on the back of the hip and is responsible for externally rotating the hip. The sciatic nerve and its branches connect to much of the leg. Normally the sciatic nerve runs between the piriformis muscle and other muscles. However, in certain individuals the nerve runs through the muscle, which causes an increase in pressure on the nerve and results in the symptoms of piriformis syndrome. SYMPTOMS   Pain, tingling, numbness, or burning in the back of the thigh that may also extend down the entire leg.  Occasionally, tenderness in the buttock.  Loss of function of the leg.  Pain that worsens when using the piriformis muscle (running, jumping, or stairs).  Pain that increases with prolonged sitting.  Pain that is lessened by lying flat on the back. CAUSES   Piriformis syndrome is the result of an increase in pressure placed on the sciatic nerve. Oftentimes, piriformis syndrome is an overuse injury.  Stress placed on the nerve from a sudden increase in the intensity, frequency, or duration of training.  Compensation of other extremity injuries. RISK INCREASES WITH:  Sports that involve the piriformis muscle (running, walking, or jumping).  You are born with (congenital) a defect in which the sciatic nerve passes through the muscle. PREVENTION  Warm up and stretch properly before activity.  Allow for adequate recovery between workouts.  Maintain physical fitness:  Strength, flexibility, and endurance.  Cardiovascular fitness. PROGNOSIS  If treated properly, the symptoms of piriformis syndrome usually resolve in 2 to 6  weeks. RELATED COMPLICATIONS   Persistent and possibly permanent pain and numbness in the lower extremity.  Weakness of the extremity that may progress to disability and inability to compete. TREATMENT  The most effective treatment for piriformis syndrome is rest from any activities that aggravate the symptoms. Ice and pain medication may help reduce pain and inflammation. The use of strengthening and stretching exercises may help reduce pain with activity. These exercises may be performed at home or with a therapist. A referral to a therapist may be given for further evaluation and treatment, such as ultrasound. Corticosteroid injections may be given to reduce inflammation that is causing pressure to be placed on the sciatic nerve. If nonsurgical (conservative) treatment is unsuccessful, then surgery may be recommended.  MEDICATION   If pain medication is necessary, then nonsteroidal anti-inflammatory medications, such as aspirin and ibuprofen, or other minor pain relievers, such as acetaminophen, are often recommended.  Do not take pain medication for 7 days before surgery.  Prescription pain relievers may be given if deemed necessary by your caregiver. Use only as directed and only as much as you need.  Corticosteroid injections may be given by your caregiver. These injections should be reserved for the most serious cases, because they may only be given a certain number of times. HEAT AND COLD:   Cold treatment (icing) relieves pain and reduces inflammation. Cold treatment should be applied for 10 to 15 minutes every 2 to 3 hours for inflammation and pain and immediately after any activity that aggravates your symptoms. Use ice packs or massage the area with a piece of ice (ice massage).  Heat   treatment may be used prior to performing the stretching and strengthening activities prescribed by your caregiver, physical therapist, or athletic trainer. Use a heat pack or soak the injury in warm  water. SEEK IMMEDIATE MEDICAL CARE IF:  Treatment seems to offer no benefit, or the condition worsens.  Any medications produce adverse side effects. EXERCISES RANGE OF MOTION (ROM) AND STRETCHING EXERCISES - Piriformis Syndrome These exercises may help you when beginning to rehabilitate your injury. Your symptoms may resolve with or without further involvement from your physician, physical therapist, or athletic trainer. While completing these exercises, remember:   Restoring tissue flexibility helps normal motion to return to the joints. This allows healthier, less painful movement and activity.  An effective stretch should be held for at least 30 seconds.  A stretch should never be painful. You should only feel a gentle lengthening or release in the stretched tissue. STRETCH - Hip Rotators  Lie on your back on a firm surface. Grasp your right / left knee with your right / left hand and your ankle with your opposite hand.  Keeping your hips and shoulders firmly planted, gently pull your right / left knee and rotate your lower leg toward your opposite shoulder until you feel a stretch in your buttocks.  Hold this stretch for __________ seconds. Repeat this stretch __________ times. Complete this stretch __________ times per day. STRETCH - Iliotibial Band  On the floor or bed, lie on your side so your right / left leg is on top. Bend your knee and grab your ankle.  Slowly bring your knee back so that your thigh is in line with your trunk. Keep your heel at your buttocks and gently arch your back so your head, shoulders, and hips line up.  Slowly lower your leg so that your knee approaches the floor/bed until you feel a gentle stretch on the outside of your right / left thigh. If you do not feel a stretch and your knee will not fall farther, place the heel of your opposite foot on top of your knee and pull your thigh down farther.  Hold this stretch for __________ seconds. Repeat  __________ times. Complete __________ times per day. STRENGTHENING EXERCISES - Piriformis Syndrome  These are some of the caregiver again or until your symptoms are resolved. Remember:   Strong muscles with good endurance tolerate stress better.  Do the exercises as initially prescribed by your caregiver. Progress slowly with each exercise, gradually increasing the number of repetitions and weight used under their guidance. STRENGTH - Hip Abductors, Straight Leg Raises Be aware of your form throughout the entire exercise so that you exercise the correct muscles. Sloppy form means that you are not strengthening the correct muscles.  Lie on your side so that your head, shoulders, knee, and hip line up. You may bend your lower knee to help maintain your balance. Your right / left leg should be on top.  Roll your hips slightly forward, so that your hips are stacked directly over each other and your right / left knee is facing forward.  Lift your top leg up 4-6 inches, leading with your heel. Be sure that your foot does not drift forward or that your knee does not roll toward the ceiling.  Hold this position for __________ seconds. You should feel the muscles in your outer hip lifting (you may not notice this until your leg begins to tire).  Slowly lower your leg to the starting position. Allow the muscles to fully   relax before beginning the next repetition. Repeat __________ times. Complete this exercise __________ times per day.  STRENGTH - Hip Abductors, Quadruped  On a firm, lightly padded surface, position yourself on your hands and knees. Your hands should be directly below your shoulders and your knees should be directly below your hips.  Keeping your right / left knee bent, lift your leg out to the side. Keep your legs level and in line with your shoulders.  Position yourself on your hands and knees.  Hold for __________ seconds.  Keeping your trunk steady and your hips level, slowly  lower your leg to the starting position. Repeat __________ times. Complete this exercise __________ times per day.  STRENGTH - Hip Abductors, Standing  Tie one end of a rubber exercise band/tubing to a secure surface (table, pole) and tie a loop at the other end.  Place the loop around your right / left ankle. Keeping your ankle with the band directly opposite of the secured end, step away until there is tension in the tube/band.  Hold onto a chair as needed for balance.  Keeping your back upright, your shoulders over your hips, and your toes pointing forward, lift your right / left leg out to your side. Be sure to lift your leg with your hip muscles. Do not "throw" your leg or tip your body to lift your leg.  Slowly and with control, return to the starting position. Repeat exercise __________ times. Complete this exercise __________ times per day.    This information is not intended to replace advice given to you by your health care provider. Make sure you discuss any questions you have with your health care provider.   Document Released: 01/05/2005 Document Revised: 05/22/2014 Document Reviewed: 04/19/2008 Elsevier Interactive Patient Education 2016 [employer]  

## 2015-07-31 NOTE — Progress Notes (Signed)
Pre visit review using our clinic review tool, if applicable. No additional management support is needed unless otherwise documented below in the visit note. 

## 2015-10-17 ENCOUNTER — Encounter: Payer: Self-pay | Admitting: Gastroenterology

## 2015-10-23 ENCOUNTER — Other Ambulatory Visit (INDEPENDENT_AMBULATORY_CARE_PROVIDER_SITE_OTHER): Payer: Medicare Other

## 2015-10-23 ENCOUNTER — Ambulatory Visit (INDEPENDENT_AMBULATORY_CARE_PROVIDER_SITE_OTHER): Payer: Medicare Other | Admitting: Internal Medicine

## 2015-10-23 ENCOUNTER — Encounter: Payer: Self-pay | Admitting: Internal Medicine

## 2015-10-23 VITALS — BP 128/88 | HR 58 | Temp 98.2°F | Wt 288.0 lb

## 2015-10-23 DIAGNOSIS — Z23 Encounter for immunization: Secondary | ICD-10-CM | POA: Diagnosis not present

## 2015-10-23 DIAGNOSIS — Z0000 Encounter for general adult medical examination without abnormal findings: Secondary | ICD-10-CM

## 2015-10-23 DIAGNOSIS — R600 Localized edema: Secondary | ICD-10-CM | POA: Diagnosis not present

## 2015-10-23 DIAGNOSIS — E034 Atrophy of thyroid (acquired): Secondary | ICD-10-CM | POA: Diagnosis not present

## 2015-10-23 DIAGNOSIS — Z86718 Personal history of other venous thrombosis and embolism: Secondary | ICD-10-CM | POA: Diagnosis not present

## 2015-10-23 DIAGNOSIS — F528 Other sexual dysfunction not due to a substance or known physiological condition: Secondary | ICD-10-CM

## 2015-10-23 DIAGNOSIS — N320 Bladder-neck obstruction: Secondary | ICD-10-CM

## 2015-10-23 DIAGNOSIS — IMO0001 Reserved for inherently not codable concepts without codable children: Secondary | ICD-10-CM

## 2015-10-23 DIAGNOSIS — Z6836 Body mass index (BMI) 36.0-36.9, adult: Secondary | ICD-10-CM

## 2015-10-23 DIAGNOSIS — E669 Obesity, unspecified: Secondary | ICD-10-CM

## 2015-10-23 LAB — HEPATIC FUNCTION PANEL
ALT: 16 U/L (ref 0–53)
AST: 15 U/L (ref 0–37)
Albumin: 4 g/dL (ref 3.5–5.2)
Alkaline Phosphatase: 56 U/L (ref 39–117)
Bilirubin, Direct: 0.2 mg/dL (ref 0.0–0.3)
Total Bilirubin: 0.7 mg/dL (ref 0.2–1.2)
Total Protein: 6.7 g/dL (ref 6.0–8.3)

## 2015-10-23 LAB — CBC WITH DIFFERENTIAL/PLATELET
Basophils Absolute: 0 10*3/uL (ref 0.0–0.1)
Basophils Relative: 0.1 % (ref 0.0–3.0)
Eosinophils Absolute: 0.1 10*3/uL (ref 0.0–0.7)
Eosinophils Relative: 1.5 % (ref 0.0–5.0)
HCT: 43.3 % (ref 39.0–52.0)
Hemoglobin: 15 g/dL (ref 13.0–17.0)
Lymphocytes Relative: 18.3 % (ref 12.0–46.0)
Lymphs Abs: 1.6 10*3/uL (ref 0.7–4.0)
MCHC: 34.6 g/dL (ref 30.0–36.0)
MCV: 97.1 fl (ref 78.0–100.0)
Monocytes Absolute: 0.5 10*3/uL (ref 0.1–1.0)
Monocytes Relative: 5.4 % (ref 3.0–12.0)
Neutro Abs: 6.7 10*3/uL (ref 1.4–7.7)
Neutrophils Relative %: 74.7 % (ref 43.0–77.0)
Platelets: 353 10*3/uL (ref 150.0–400.0)
RBC: 4.47 Mil/uL (ref 4.22–5.81)
RDW: 12.7 % (ref 11.5–15.5)
WBC: 8.9 10*3/uL (ref 4.0–10.5)

## 2015-10-23 LAB — LIPID PANEL
Cholesterol: 142 mg/dL (ref 0–200)
HDL: 52.8 mg/dL (ref >39.00)
LDL Cholesterol: 67 mg/dL (ref 0–99)
NonHDL: 89.37
Total CHOL/HDL Ratio: 3
Triglycerides: 114 mg/dL (ref 0.0–149.0)
VLDL: 22.8 mg/dL (ref 0.0–40.0)

## 2015-10-23 LAB — URINALYSIS
Bilirubin Urine: NEGATIVE
Hgb urine dipstick: NEGATIVE
Ketones, ur: NEGATIVE
Leukocytes, UA: NEGATIVE
Nitrite: NEGATIVE
Specific Gravity, Urine: 1.025 (ref 1.000–1.030)
Total Protein, Urine: NEGATIVE
Urine Glucose: NEGATIVE
Urobilinogen, UA: 0.2 (ref 0.0–1.0)
pH: 5.5 (ref 5.0–8.0)

## 2015-10-23 LAB — BASIC METABOLIC PANEL
BUN: 12 mg/dL (ref 6–23)
CO2: 27 mEq/L (ref 19–32)
Calcium: 8.9 mg/dL (ref 8.4–10.5)
Chloride: 103 mEq/L (ref 96–112)
Creatinine, Ser: 1 mg/dL (ref 0.40–1.50)
GFR: 79.65 mL/min (ref >60.00)
Glucose, Bld: 97 mg/dL (ref 70–99)
Potassium: 4.5 mEq/L (ref 3.5–5.1)
Sodium: 138 mEq/L (ref 135–145)

## 2015-10-23 LAB — PSA: PSA: 0.51 ng/mL (ref 0.10–4.00)

## 2015-10-23 LAB — TSH: TSH: 1.04 u[IU]/mL (ref 0.35–4.50)

## 2015-10-23 MED ORDER — ALPRAZOLAM 1 MG PO TABS: 0.5000 mg | ORAL_TABLET | Freq: Two times a day (BID) | ORAL | 1 refills | Status: DC | PRN

## 2015-10-23 NOTE — Assessment & Plan Note (Signed)
  RLE - post-DVT  Compr sock, wt loss

## 2015-10-23 NOTE — Progress Notes (Signed)
Subjective:  Patient ID: Joseph [name], male    DOB: 09/08/1950  Age: 65 y.o. MRN: [id number]  CC: No chief complaint on file.   HPI BRYSEN MEDICA presents for anxiety, obesity, DVT f/u  Outpatient Medications Prior to Visit  Medication Sig Dispense Refill  . ALPRAZolam (XANAX) 1 MG tablet Take 0.5-1 tablets (0.5-1 mg total) by mouth 2 (two) times daily as needed for anxiety. 180 tablet 1  . BELVIQ 10 MG TABS TAKE 1 TABLET BY MOUTH TWICE DAILY 60 tablet 2  . Cholecalciferol 1000 UNITS tablet Take 1,000 Units by mouth daily. Reported on 04/24/2015    . levothyroxine (SYNTHROID, LEVOTHROID) 25 MCG tablet Take 1 tablet (25 mcg total) by mouth daily. 90 tablet 3  . sildenafil (VIAGRA) 100 MG tablet Take 1 tablet (100 mg total) by mouth as needed for erectile dysfunction. 80 tablet 0  . tiZANidine (ZANAFLEX) 4 MG tablet Take 1 tablet (4 mg total) by mouth 2 (two) times daily as needed for muscle spasms. 30 tablet 1  . warfarin (COUMADIN) 10 MG tablet TAKE 1 TABLET BY MOUTH AS DIRECTED, THEN 1&1/2 TABLET ON MONDAY AND FRIDAY AND 1 TABLET ON ALL OTHER DAYS. 120 tablet 3   No facility-administered medications prior to visit.     ROS Review of Systems  Constitutional: Negative for appetite change, fatigue and unexpected weight change.  HENT: Negative for congestion, nosebleeds, sneezing, sore throat and trouble swallowing.   Eyes: Negative for itching and visual disturbance.  Respiratory: Negative for cough.   Cardiovascular: Positive for leg swelling. Negative for chest pain and palpitations.  Gastrointestinal: Negative for abdominal distention, blood in stool, diarrhea and nausea.  Genitourinary: Negative for frequency and hematuria.  Musculoskeletal: Negative for back pain, gait problem, joint swelling and neck pain.  Skin: Negative for rash.  Neurological: Negative for dizziness, tremors, speech difficulty and weakness.  Psychiatric/Behavioral: Negative for agitation, dysphoric mood  and sleep disturbance. The patient is not nervous/anxious.     Objective:  Wt 288 lb (130.6 kg)   BMI 36.00 kg/m   BP Readings from Last 3 Encounters:  07/31/15 130/80  04/24/15 126/76  01/02/15 118/82    Wt Readings from Last 3 Encounters:  10/23/15 288 lb (130.6 kg)  07/31/15 291 lb (132 kg)  04/24/15 276 lb (125.2 kg)    Physical Exam  Constitutional: He is oriented to person, place, and time. He appears well-developed. No distress.  NAD  HENT:  Mouth/Throat: Oropharynx is clear and moist.  Eyes: Conjunctivae are normal. Pupils are equal, round, and reactive to light.  Neck: Normal range of motion. No JVD present. No thyromegaly present.  Cardiovascular: Normal rate, regular rhythm, normal heart sounds and intact distal pulses.  Exam reveals no gallop and no friction rub.   No murmur heard. Pulmonary/Chest: Effort normal and breath sounds normal. No respiratory distress. He has no wheezes. He has no rales. He exhibits no tenderness.  Abdominal: Soft. Bowel sounds are normal. He exhibits no distension and no mass. There is no tenderness. There is no rebound and no guarding.  Musculoskeletal: Normal range of motion. He exhibits edema. He exhibits no tenderness.  Lymphadenopathy:    He has no cervical adenopathy.  Neurological: He is alert and oriented to person, place, and time. He has normal reflexes. No cranial nerve deficit. He exhibits normal muscle tone. He displays a negative Romberg sign. Coordination and gait normal.  Skin: Skin is warm and dry. No rash noted.  Psychiatric: He  has a normal mood and affect. His behavior is normal. Judgment and thought content normal.  RLE w/edema Obese  Lab Results  Component Value Date   WBC 9.4 01/02/2015   HGB 15.5 01/02/2015   HCT 46.0 01/02/2015   PLT 392.0 01/02/2015   GLUCOSE 100 (H) 01/02/2015   CHOL 158 01/02/2015   TRIG 91.0 01/02/2015   HDL 58.40 01/02/2015   LDLCALC 81 01/02/2015   ALT 19 12/20/2013   AST 20  12/20/2013   NA 137 01/02/2015   K 3.9 01/02/2015   CL 100 01/02/2015   CREATININE 1.20 01/02/2015   BUN 19 01/02/2015   CO2 28 01/02/2015   TSH 2.72 01/02/2015   PSA 0.52 01/02/2015   INR 2.3 (H) 07/31/2015   HGBA1C 6.1 06/17/2010    No results found.  Assessment & Plan:   There are no diagnoses linked to this encounter. I am having Mr. Hillson maintain his Cholecalciferol, BELVIQ, ALPRAZolam, levothyroxine, warfarin, sildenafil, and tiZANidine.  No orders of the defined types were placed in this encounter.    Follow-up: No Follow-up on file.  [name], MD

## 2015-10-23 NOTE — Assessment & Plan Note (Signed)
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  

## 2015-10-23 NOTE — Assessment & Plan Note (Signed)
Viagra prn 

## 2015-10-23 NOTE — Assessment & Plan Note (Signed)
On diet - off meds

## 2015-10-23 NOTE — Assessment & Plan Note (Signed)
On Levoxyl 

## 2015-10-23 NOTE — Progress Notes (Signed)
Pre visit review using our clinic review tool, if applicable. No additional management support is needed unless otherwise documented below in the visit note. 

## 2015-10-24 LAB — HEPATITIS C ANTIBODY: HCV Ab: NEGATIVE

## 2015-10-28 ENCOUNTER — Encounter: Payer: Self-pay | Admitting: Gastroenterology

## 2015-10-30 ENCOUNTER — Ambulatory Visit: Payer: Managed Care, Other (non HMO) | Admitting: Internal Medicine

## 2016-01-28 ENCOUNTER — Encounter: Payer: 59 | Admitting: Internal Medicine

## 2016-02-05 ENCOUNTER — Encounter: Payer: 59 | Admitting: Internal Medicine

## 2016-02-19 ENCOUNTER — Encounter: Payer: Self-pay | Admitting: Internal Medicine

## 2016-02-19 ENCOUNTER — Ambulatory Visit (INDEPENDENT_AMBULATORY_CARE_PROVIDER_SITE_OTHER): Payer: Medicare Other | Admitting: Internal Medicine

## 2016-02-19 ENCOUNTER — Other Ambulatory Visit: Payer: Self-pay | Admitting: Internal Medicine

## 2016-02-19 ENCOUNTER — Other Ambulatory Visit (INDEPENDENT_AMBULATORY_CARE_PROVIDER_SITE_OTHER): Payer: Medicare Other

## 2016-02-19 VITALS — BP 130/88 | HR 64 | Temp 97.9°F | Resp 20 | Ht 75.0 in | Wt 296.0 lb

## 2016-02-19 DIAGNOSIS — Z23 Encounter for immunization: Secondary | ICD-10-CM | POA: Diagnosis not present

## 2016-02-19 DIAGNOSIS — Z0000 Encounter for general adult medical examination without abnormal findings: Secondary | ICD-10-CM | POA: Diagnosis not present

## 2016-02-19 DIAGNOSIS — I872 Venous insufficiency (chronic) (peripheral): Secondary | ICD-10-CM

## 2016-02-19 DIAGNOSIS — M7062 Trochanteric bursitis, left hip: Secondary | ICD-10-CM | POA: Diagnosis not present

## 2016-02-19 DIAGNOSIS — D126 Benign neoplasm of colon, unspecified: Secondary | ICD-10-CM | POA: Diagnosis not present

## 2016-02-19 DIAGNOSIS — E669 Obesity, unspecified: Secondary | ICD-10-CM

## 2016-02-19 DIAGNOSIS — Z7901 Long term (current) use of anticoagulants: Secondary | ICD-10-CM

## 2016-02-19 DIAGNOSIS — K635 Polyp of colon: Secondary | ICD-10-CM | POA: Insufficient documentation

## 2016-02-19 DIAGNOSIS — IMO0001 Reserved for inherently not codable concepts without codable children: Secondary | ICD-10-CM

## 2016-02-19 DIAGNOSIS — M7072 Other bursitis of hip, left hip: Secondary | ICD-10-CM | POA: Insufficient documentation

## 2016-02-19 DIAGNOSIS — Z6836 Body mass index (BMI) 36.0-36.9, adult: Secondary | ICD-10-CM | POA: Diagnosis not present

## 2016-02-19 DIAGNOSIS — F411 Generalized anxiety disorder: Secondary | ICD-10-CM | POA: Diagnosis not present

## 2016-02-19 LAB — COMPREHENSIVE METABOLIC PANEL
ALT: 18 U/L (ref 0–53)
AST: 16 U/L (ref 0–37)
Albumin: 4.2 g/dL (ref 3.5–5.2)
Alkaline Phosphatase: 68 U/L (ref 39–117)
BUN: 16 mg/dL (ref 6–23)
CO2: 26 mEq/L (ref 19–32)
Calcium: 9.4 mg/dL (ref 8.4–10.5)
Chloride: 105 mEq/L (ref 96–112)
Creatinine, Ser: 1.02 mg/dL (ref 0.40–1.50)
GFR: 77.78 mL/min (ref >60.00)
Glucose, Bld: 98 mg/dL (ref 70–99)
Potassium: 4.2 mEq/L (ref 3.5–5.1)
Sodium: 138 mEq/L (ref 135–145)
Total Bilirubin: 0.6 mg/dL (ref 0.2–1.2)
Total Protein: 6.7 g/dL (ref 6.0–8.3)

## 2016-02-19 LAB — CBC
HCT: 42.5 % (ref 39.0–52.0)
Hemoglobin: 14.8 g/dL (ref 13.0–17.0)
MCHC: 34.9 g/dL (ref 30.0–36.0)
MCV: 97.3 fl (ref 78.0–100.0)
Platelets: 370 10*3/uL (ref 150.0–400.0)
RBC: 4.37 Mil/uL (ref 4.22–5.81)
RDW: 12.5 % (ref 11.5–15.5)
WBC: 9.2 10*3/uL (ref 4.0–10.5)

## 2016-02-19 LAB — PROTIME-INR
INR: 2.2 ratio — ABNORMAL HIGH (ref 0.8–1.0)
Prothrombin Time: 23.6 s — ABNORMAL HIGH (ref 9.6–13.1)

## 2016-02-19 MED ORDER — LORCASERIN HCL ER 20 MG PO TB24: 1.0000 | ORAL_TABLET | Freq: Every day | ORAL | 5 refills | Status: DC

## 2016-02-19 MED ORDER — ALPRAZOLAM 1 MG PO TABS: 0.5000 mg | ORAL_TABLET | Freq: Two times a day (BID) | ORAL | 1 refills | Status: DC | PRN

## 2016-02-19 MED ORDER — SILDENAFIL CITRATE 100 MG PO TABS: 100.0000 mg | ORAL_TABLET | ORAL | 0 refills | Status: DC | PRN

## 2016-02-19 NOTE — Assessment & Plan Note (Signed)
Belviq XR

## 2016-02-19 NOTE — Assessment & Plan Note (Signed)
Colon due - 2018 The pt wants to do it in [place] close to home

## 2016-02-19 NOTE — Patient Instructions (Signed)
Hip opener exercises --- hip bursitis

## 2016-02-19 NOTE — Progress Notes (Signed)
Pre visit review using our clinic review tool, if applicable. No additional management support is needed unless otherwise documented below in the visit note. 

## 2016-02-19 NOTE — Progress Notes (Addendum)
Subjective:  Patient ID: Joseph [name], male    DOB: [birth year]  Age: 66 y.o. MRN: [id number]  CC: Medicare Wellness   HPI Joseph [name] presents for a [facility] well exam  L hip pain w/down irrad, anxiety, ED f/u  Outpatient Medications Prior to Visit  Medication Sig Dispense Refill  . ALPRAZolam (XANAX) 1 MG tablet Take 0.5-1 tablets (0.5-1 mg total) by mouth 2 (two) times daily as needed for anxiety. 180 tablet 1  . BELVIQ 10 MG TABS TAKE 1 TABLET BY MOUTH TWICE DAILY 60 tablet 2  . Cholecalciferol 1000 UNITS tablet Take 1,000 Units by mouth daily. Reported on 04/24/2015    . levothyroxine (SYNTHROID, LEVOTHROID) 25 MCG tablet Take 1 tablet (25 mcg total) by mouth daily. 90 tablet 3  . sildenafil (VIAGRA) 100 MG tablet Take 1 tablet (100 mg total) by mouth as needed for erectile dysfunction. 80 tablet 0  . tiZANidine (ZANAFLEX) 4 MG tablet Take 1 tablet (4 mg total) by mouth 2 (two) times daily as needed for muscle spasms. 30 tablet 1  . warfarin (COUMADIN) 10 MG tablet TAKE 1 TABLET BY MOUTH AS DIRECTED, THEN 1&1/2 TABLET ON MONDAY AND FRIDAY AND 1 TABLET ON ALL OTHER DAYS. 120 tablet 3   No facility-administered medications prior to visit.     ROS Review of Systems  Constitutional: Positive for unexpected weight change. Negative for appetite change and fatigue.  HENT: Negative for congestion, nosebleeds, sneezing, sore throat and trouble swallowing.   Eyes: Negative for itching and visual disturbance.  Respiratory: Negative for cough.   Cardiovascular: Positive for leg swelling. Negative for chest pain and palpitations.  Gastrointestinal: Negative for abdominal distention, blood in stool, diarrhea and nausea.  Genitourinary: Negative for frequency and hematuria.  Musculoskeletal: Positive for gait problem. Negative for back pain, joint swelling and neck pain.  Skin: Negative for rash.  Neurological: Negative for dizziness, tremors, speech difficulty and weakness.    Psychiatric/Behavioral: Positive for decreased concentration. Negative for agitation, dysphoric mood, sleep disturbance and suicidal ideas. The patient is not nervous/anxious.     Objective:  BP 130/88 (BP Location: Left Arm, Patient Position: Sitting, Cuff Size: Large)   Pulse 64   Temp 97.9 F (36.6 C) (Oral)   Resp 20   Ht 6\' 3"  (1.905 m)   Wt 296 lb (134.3 kg)   SpO2 94%   BMI 37.00 kg/m   BP Readings from Last 3 Encounters:  02/19/16 130/88  10/23/15 128/88  07/31/15 130/80    Wt Readings from Last 3 Encounters:  02/19/16 296 lb (134.3 kg)  10/23/15 288 lb (130.6 kg)  07/31/15 291 lb (132 kg)    Physical Exam  Constitutional: He is oriented to person, place, and time. He appears well-developed. No distress.  NAD  HENT:  Mouth/Throat: Oropharynx is clear and moist.  Eyes: Conjunctivae are normal. Pupils are equal, round, and reactive to light.  Neck: Normal range of motion. No JVD present. No thyromegaly present.  Cardiovascular: Normal rate, regular rhythm, normal heart sounds and intact distal pulses.  Exam reveals no gallop and no friction rub.   No murmur heard. Pulmonary/Chest: Effort normal and breath sounds normal. No respiratory distress. He has no wheezes. He has no rales. He exhibits no tenderness.  Abdominal: Soft. Bowel sounds are normal. He exhibits no distension and no mass. There is no tenderness. There is no rebound and no guarding.  Musculoskeletal: Normal range of motion. He exhibits no edema or tenderness.  Lymphadenopathy:    He has no cervical adenopathy.  Neurological: He is alert and oriented to person, place, and time. He has normal reflexes. No cranial nerve deficit. He exhibits normal muscle tone. He displays a negative Romberg sign. Coordination and gait normal.  Skin: Skin is warm and dry. No rash noted.  Psychiatric: He has a normal mood and affect. His behavior is normal. Judgment and thought content normal.  Rectal - per GI L troch  major is tender R LE w/edema  Lab Results  Component Value Date   WBC 8.9 10/23/2015   HGB 15.0 10/23/2015   HCT 43.3 10/23/2015   PLT 353.0 10/23/2015   GLUCOSE 97 10/23/2015   CHOL 142 10/23/2015   TRIG 114.0 10/23/2015   HDL 52.80 10/23/2015   LDLCALC 67 10/23/2015   ALT 16 10/23/2015   AST 15 10/23/2015   NA 138 10/23/2015   K 4.5 10/23/2015   CL 103 10/23/2015   CREATININE 1.00 10/23/2015   BUN 12 10/23/2015   CO2 27 10/23/2015   TSH 1.04 10/23/2015   PSA 0.51 10/23/2015   INR 2.3 (H) 07/31/2015   HGBA1C 6.1 06/17/2010    No results found.  Assessment & Plan:   There are no diagnoses linked to this encounter. I am having Joseph [name] maintain his Cholecalciferol, BELVIQ, levothyroxine, warfarin, sildenafil, tiZANidine, and ALPRAZolam.  No orders of the defined types were placed in this encounter.    Follow-up: No Follow-up on file.  [name], MD

## 2016-02-19 NOTE — Assessment & Plan Note (Signed)

## 2016-02-19 NOTE — Assessment & Plan Note (Signed)
On Xanax prn  Potential benefits of a long term benzodiazepines use as well as potential risks  and complications were explained to the patient and were aknowledged. 

## 2016-02-19 NOTE — Assessment & Plan Note (Signed)
Stretch hips Ice

## 2016-05-20 ENCOUNTER — Encounter: Payer: Self-pay | Admitting: Internal Medicine

## 2016-05-20 ENCOUNTER — Ambulatory Visit (INDEPENDENT_AMBULATORY_CARE_PROVIDER_SITE_OTHER): Payer: Medicare Other | Admitting: Internal Medicine

## 2016-05-20 ENCOUNTER — Other Ambulatory Visit (INDEPENDENT_AMBULATORY_CARE_PROVIDER_SITE_OTHER): Payer: Medicare Other

## 2016-05-20 DIAGNOSIS — M7072 Other bursitis of hip, left hip: Secondary | ICD-10-CM | POA: Diagnosis not present

## 2016-05-20 DIAGNOSIS — M25562 Pain in left knee: Secondary | ICD-10-CM

## 2016-05-20 DIAGNOSIS — Z7901 Long term (current) use of anticoagulants: Secondary | ICD-10-CM | POA: Diagnosis not present

## 2016-05-20 DIAGNOSIS — Z86718 Personal history of other venous thrombosis and embolism: Secondary | ICD-10-CM

## 2016-05-20 DIAGNOSIS — F411 Generalized anxiety disorder: Secondary | ICD-10-CM | POA: Diagnosis not present

## 2016-05-20 LAB — PROTIME-INR
INR: 1.8 ratio — ABNORMAL HIGH (ref 0.8–1.0)
Prothrombin Time: 18.9 s — ABNORMAL HIGH (ref 9.6–13.1)

## 2016-05-20 MED ORDER — LORCASERIN HCL 10 MG PO TABS: 1.0000 | ORAL_TABLET | Freq: Two times a day (BID) | ORAL | 5 refills | Status: DC

## 2016-05-20 NOTE — Assessment & Plan Note (Signed)
Exercise ball BackApp stool Hip opener exercises

## 2016-05-20 NOTE — Assessment & Plan Note (Signed)
On Xanax prn  Potential benefits of a long term benzodiazepines use as well as potential risks  and complications were explained to the patient and were aknowledged. 

## 2016-05-20 NOTE — Progress Notes (Signed)
Subjective:  Patient ID: Joseph [name], male    DOB: Jan 15, 1951  Age: 66 y.o. MRN: [id number]  CC: No chief complaint on file.   HPI Joseph [name] presents for L knee pain - worsening pain, post-DVT edema, obesity f/u  Outpatient Medications Prior to Visit  Medication Sig Dispense Refill  . ALPRAZolam (XANAX) 1 MG tablet Take 0.5-1 tablets (0.5-1 mg total) by mouth 2 (two) times daily as needed for anxiety. 180 tablet 1  . Cholecalciferol 1000 UNITS tablet Take 1,000 Units by mouth daily. Reported on 04/24/2015    . levothyroxine (SYNTHROID, LEVOTHROID) 25 MCG tablet Take 1 tablet (25 mcg total) by mouth daily. 90 tablet 3  . Lorcaserin HCl ER (BELVIQ XR) 20 MG TB24 Take 1 tablet by mouth daily. 30 tablet 5  . sildenafil (VIAGRA) 100 MG tablet Take 1 tablet (100 mg total) by mouth as needed for erectile dysfunction. 80 tablet 0  . warfarin (COUMADIN) 10 MG tablet TAKE 1 TABLET BY MOUTH AS DIRECTED, THEN 1&1/2 TABLET ON MONDAY AND FRIDAY AND 1 TABLET ON ALL OTHER DAYS. 120 tablet 3   No facility-administered medications prior to visit.     ROS Review of Systems  Constitutional: Negative for appetite change, fatigue and unexpected weight change.  HENT: Negative for congestion, nosebleeds, sneezing, sore throat and trouble swallowing.   Eyes: Negative for itching and visual disturbance.  Respiratory: Negative for cough.   Cardiovascular: Positive for leg swelling. Negative for chest pain and palpitations.  Gastrointestinal: Negative for abdominal distention, blood in stool, diarrhea and nausea.  Genitourinary: Negative for frequency and hematuria.  Musculoskeletal: Negative for back pain, gait problem, joint swelling and neck pain.  Skin: Negative for rash.  Neurological: Negative for dizziness, tremors, speech difficulty and weakness.  Psychiatric/Behavioral: Negative for agitation, dysphoric mood and sleep disturbance. The patient is not nervous/anxious.     Objective:  BP  138/82 (BP Location: Left Arm, Patient Position: Sitting, Cuff Size: Large)   Pulse (!) 57   Temp 97.9 F (36.6 C) (Oral)   Ht 6\' 3"  (1.905 m)   Wt 293 lb 1.9 oz (133 kg)   SpO2 99%   BMI 36.64 kg/m   BP Readings from Last 3 Encounters:  05/20/16 138/82  02/19/16 130/88  10/23/15 128/88    Wt Readings from Last 3 Encounters:  05/20/16 293 lb 1.9 oz (133 kg)  02/19/16 296 lb (134.3 kg)  10/23/15 288 lb (130.6 kg)    Physical Exam  Constitutional: He is oriented to person, place, and time. He appears well-developed. No distress.  NAD  HENT:  Mouth/Throat: Oropharynx is clear and moist.  Eyes: Conjunctivae are normal. Pupils are equal, round, and reactive to light.  Neck: Normal range of motion. No JVD present. No thyromegaly present.  Cardiovascular: Normal rate, regular rhythm, normal heart sounds and intact distal pulses.  Exam reveals no gallop and no friction rub.   No murmur heard. Pulmonary/Chest: Effort normal and breath sounds normal. No respiratory distress. He has no wheezes. He has no rales. He exhibits no tenderness.  Abdominal: Soft. Bowel sounds are normal. He exhibits no distension and no mass. There is no tenderness. There is no rebound and no guarding.  Musculoskeletal: Normal range of motion. He exhibits edema. He exhibits no tenderness.  Lymphadenopathy:    He has no cervical adenopathy.  Neurological: He is alert and oriented to person, place, and time. He has normal reflexes. No cranial nerve deficit. He exhibits normal muscle  tone. He displays a negative Romberg sign. Coordination and gait normal.  Skin: Skin is warm and dry. No rash noted.  Psychiatric: He has a normal mood and affect. His behavior is normal. Judgment and thought content normal.  L knee is tender  Lab Results  Component Value Date   WBC 9.2 02/19/2016   HGB 14.8 02/19/2016   HCT 42.5 02/19/2016   PLT 370.0 02/19/2016   GLUCOSE 98 02/19/2016   CHOL 142 10/23/2015   TRIG 114.0  10/23/2015   HDL 52.80 10/23/2015   LDLCALC 67 10/23/2015   ALT 18 02/19/2016   AST 16 02/19/2016   NA 138 02/19/2016   K 4.2 02/19/2016   CL 105 02/19/2016   CREATININE 1.02 02/19/2016   BUN 16 02/19/2016   CO2 26 02/19/2016   TSH 1.04 10/23/2015   PSA 0.51 10/23/2015   INR 1.8 (H) 05/20/2016   HGBA1C 6.1 06/17/2010    No results found.  Assessment & Plan:   There are no diagnoses linked to this encounter. I am having Mr. Joseph [name] maintain his Cholecalciferol, levothyroxine, warfarin, Lorcaserin HCl ER, ALPRAZolam, and sildenafil.  No orders of the defined types were placed in this encounter.    Follow-up: No Follow-up on file.  [name], MD

## 2016-05-20 NOTE — Progress Notes (Signed)
Pre visit review using our clinic review tool, if applicable. No additional management support is needed unless otherwise documented below in the visit note. 

## 2016-05-20 NOTE — Assessment & Plan Note (Signed)
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

## 2016-05-20 NOTE — Patient Instructions (Signed)
Exercise ball BackApp stool Hip opener exercises

## 2016-05-20 NOTE — Assessment & Plan Note (Addendum)
L knee pain ?meniscal tear Ortho ref

## 2016-06-11 DIAGNOSIS — Z9189 Other specified personal risk factors, not elsewhere classified: Secondary | ICD-10-CM | POA: Diagnosis not present

## 2016-06-11 DIAGNOSIS — Z86010 Personal history of colonic polyps: Secondary | ICD-10-CM | POA: Diagnosis not present

## 2016-06-11 DIAGNOSIS — Z1211 Encounter for screening for malignant neoplasm of colon: Secondary | ICD-10-CM | POA: Diagnosis not present

## 2016-08-19 ENCOUNTER — Ambulatory Visit (INDEPENDENT_AMBULATORY_CARE_PROVIDER_SITE_OTHER): Payer: Medicare Other | Admitting: Internal Medicine

## 2016-08-19 ENCOUNTER — Encounter: Payer: Self-pay | Admitting: Internal Medicine

## 2016-08-19 ENCOUNTER — Other Ambulatory Visit (INDEPENDENT_AMBULATORY_CARE_PROVIDER_SITE_OTHER): Payer: Medicare Other

## 2016-08-19 DIAGNOSIS — F411 Generalized anxiety disorder: Secondary | ICD-10-CM

## 2016-08-19 DIAGNOSIS — Z7901 Long term (current) use of anticoagulants: Secondary | ICD-10-CM | POA: Diagnosis not present

## 2016-08-19 DIAGNOSIS — E039 Hypothyroidism, unspecified: Secondary | ICD-10-CM | POA: Diagnosis not present

## 2016-08-19 LAB — PROTIME-INR
INR: 2 ratio — ABNORMAL HIGH (ref 0.8–1.0)
Prothrombin Time: 21.4 s — ABNORMAL HIGH (ref 9.6–13.1)

## 2016-08-19 MED ORDER — LEVOTHYROXINE SODIUM 25 MCG PO TABS: 25.0000 ug | ORAL_TABLET | Freq: Every day | ORAL | 3 refills | Status: DC

## 2016-08-19 MED ORDER — ALPRAZOLAM 1 MG PO TABS: 0.5000 mg | ORAL_TABLET | Freq: Two times a day (BID) | ORAL | 1 refills | Status: DC | PRN

## 2016-08-19 MED ORDER — WARFARIN SODIUM 10 MG PO TABS: ORAL_TABLET | ORAL | 3 refills | Status: DC

## 2016-08-19 MED ORDER — SILDENAFIL CITRATE 100 MG PO TABS: 100.0000 mg | ORAL_TABLET | ORAL | 0 refills | Status: DC | PRN

## 2016-08-19 NOTE — Progress Notes (Signed)
Subjective:  Patient ID: Joseph [name], male    DOB: 07/31/1950  Age: 66 y.o. MRN: [id number]  CC: No chief complaint on file.   HPI Joseph [name] presents for h/o DVT, hypothyroidism, ED f/u  Outpatient Medications Prior to Visit  Medication Sig Dispense Refill  . ALPRAZolam (XANAX) 1 MG tablet Take 0.5-1 tablets (0.5-1 mg total) by mouth 2 (two) times daily as needed for anxiety. 180 tablet 1  . Cholecalciferol 1000 UNITS tablet Take 1,000 Units by mouth daily. Reported on 04/24/2015    . levothyroxine (SYNTHROID, LEVOTHROID) 25 MCG tablet Take 1 tablet (25 mcg total) by mouth daily. 90 tablet 3  . sildenafil (VIAGRA) 100 MG tablet Take 1 tablet (100 mg total) by mouth as needed for erectile dysfunction. 80 tablet 0  . warfarin (COUMADIN) 10 MG tablet TAKE 1 TABLET BY MOUTH AS DIRECTED, THEN 1&1/2 TABLET ON MONDAY AND FRIDAY AND 1 TABLET ON ALL OTHER DAYS. 120 tablet 3  . Lorcaserin HCl (BELVIQ) 10 MG TABS Take 1 tablet by mouth 2 (two) times daily. 60 tablet 5   No facility-administered medications prior to visit.     ROS Review of Systems  Constitutional: Positive for unexpected weight change. Negative for appetite change and fatigue.  HENT: Negative for congestion, nosebleeds, sneezing, sore throat and trouble swallowing.   Eyes: Negative for itching and visual disturbance.  Respiratory: Negative for cough.   Cardiovascular: Negative for chest pain, palpitations and leg swelling.  Gastrointestinal: Negative for abdominal distention, blood in stool, diarrhea and nausea.  Genitourinary: Negative for frequency and hematuria.  Musculoskeletal: Negative for back pain, gait problem, joint swelling and neck pain.  Skin: Negative for rash.  Neurological: Negative for dizziness, tremors, speech difficulty and weakness.  Psychiatric/Behavioral: Negative for agitation, dysphoric mood and sleep disturbance. The patient is not nervous/anxious.     Objective:  BP 124/78 (BP Location:  Left Arm, Patient Position: Sitting, Cuff Size: Large)   Pulse 65   Temp 98.8 F (37.1 C) (Oral)   Ht 6\' 3"  (1.905 m)   Wt 300 lb (136.1 kg)   SpO2 98%   BMI 37.50 kg/m   BP Readings from Last 3 Encounters:  08/19/16 124/78  05/20/16 138/82  02/19/16 130/88    Wt Readings from Last 3 Encounters:  08/19/16 300 lb (136.1 kg)  05/20/16 293 lb 1.9 oz (133 kg)  02/19/16 296 lb (134.3 kg)    Physical Exam  Constitutional: He is oriented to person, place, and time. He appears well-developed. No distress.  NAD  HENT:  Mouth/Throat: Oropharynx is clear and moist.  Eyes: Pupils are equal, round, and reactive to light. Conjunctivae are normal.  Neck: Normal range of motion. No JVD present. No thyromegaly present.  Cardiovascular: Normal rate, regular rhythm, normal heart sounds and intact distal pulses.  Exam reveals no gallop and no friction rub.   No murmur heard. Pulmonary/Chest: Effort normal and breath sounds normal. No respiratory distress. He has no wheezes. He has no rales. He exhibits no tenderness.  Abdominal: Soft. Bowel sounds are normal. He exhibits no distension and no mass. There is no tenderness. There is no rebound and no guarding.  Musculoskeletal: Normal range of motion. He exhibits no edema or tenderness.  Lymphadenopathy:    He has no cervical adenopathy.  Neurological: He is alert and oriented to person, place, and time. He has normal reflexes. No cranial nerve deficit. He exhibits normal muscle tone. He displays a negative Romberg sign. Coordination  and gait normal.  Skin: Skin is warm and dry. No rash noted.  Psychiatric: He has a normal mood and affect. His behavior is normal. Judgment and thought content normal.    Lab Results  Component Value Date   WBC 9.2 02/19/2016   HGB 14.8 02/19/2016   HCT 42.5 02/19/2016   PLT 370.0 02/19/2016   GLUCOSE 98 02/19/2016   CHOL 142 10/23/2015   TRIG 114.0 10/23/2015   HDL 52.80 10/23/2015   LDLCALC 67 10/23/2015     ALT 18 02/19/2016   AST 16 02/19/2016   NA 138 02/19/2016   K 4.2 02/19/2016   CL 105 02/19/2016   CREATININE 1.02 02/19/2016   BUN 16 02/19/2016   CO2 26 02/19/2016   TSH 1.04 10/23/2015   PSA 0.51 10/23/2015   INR 1.8 (H) 05/20/2016   HGBA1C 6.1 06/17/2010    No results found.  Assessment & Plan:   There are no diagnoses linked to this encounter. I have discontinued Mr. Jungwirth Lorcaserin HCl. I am also having him maintain his Cholecalciferol, levothyroxine, warfarin, ALPRAZolam, and sildenafil.  No orders of the defined types were placed in this encounter.    Follow-up: No Follow-up on file.  [name], MD

## 2016-08-19 NOTE — Assessment & Plan Note (Signed)
On Xanax prn  Potential benefits of a long term benzodiazepines use as well as potential risks  and complications were explained to the patient and were aknowledged. 

## 2016-08-19 NOTE — Assessment & Plan Note (Signed)
On Levoxyl 

## 2016-09-07 ENCOUNTER — Encounter: Payer: Self-pay | Admitting: Internal Medicine

## 2016-10-16 DIAGNOSIS — Z86010 Personal history of colonic polyps: Secondary | ICD-10-CM | POA: Diagnosis not present

## 2016-10-16 DIAGNOSIS — K5140 Inflammatory polyps of colon without complications: Secondary | ICD-10-CM | POA: Diagnosis not present

## 2016-10-16 DIAGNOSIS — D125 Benign neoplasm of sigmoid colon: Secondary | ICD-10-CM | POA: Diagnosis not present

## 2016-10-16 DIAGNOSIS — D124 Benign neoplasm of descending colon: Secondary | ICD-10-CM | POA: Diagnosis not present

## 2016-10-16 DIAGNOSIS — K635 Polyp of colon: Secondary | ICD-10-CM | POA: Diagnosis not present

## 2016-10-16 DIAGNOSIS — K621 Rectal polyp: Secondary | ICD-10-CM | POA: Diagnosis not present

## 2016-10-16 DIAGNOSIS — Z1211 Encounter for screening for malignant neoplasm of colon: Secondary | ICD-10-CM | POA: Diagnosis not present

## 2016-10-16 DIAGNOSIS — D123 Benign neoplasm of transverse colon: Secondary | ICD-10-CM | POA: Diagnosis not present

## 2016-11-18 ENCOUNTER — Other Ambulatory Visit (INDEPENDENT_AMBULATORY_CARE_PROVIDER_SITE_OTHER): Payer: Medicare Other

## 2016-11-18 ENCOUNTER — Encounter: Payer: Self-pay | Admitting: Internal Medicine

## 2016-11-18 ENCOUNTER — Ambulatory Visit (INDEPENDENT_AMBULATORY_CARE_PROVIDER_SITE_OTHER): Payer: Medicare Other | Admitting: Internal Medicine

## 2016-11-18 VITALS — BP 126/86 | HR 53 | Temp 98.5°F | Ht 75.0 in | Wt 286.0 lb

## 2016-11-18 DIAGNOSIS — Z7901 Long term (current) use of anticoagulants: Secondary | ICD-10-CM

## 2016-11-18 DIAGNOSIS — Z23 Encounter for immunization: Secondary | ICD-10-CM

## 2016-11-18 DIAGNOSIS — F411 Generalized anxiety disorder: Secondary | ICD-10-CM | POA: Diagnosis not present

## 2016-11-18 DIAGNOSIS — E039 Hypothyroidism, unspecified: Secondary | ICD-10-CM

## 2016-11-18 DIAGNOSIS — Z6836 Body mass index (BMI) 36.0-36.9, adult: Secondary | ICD-10-CM | POA: Diagnosis not present

## 2016-11-18 LAB — PROTIME-INR
INR: 2.1 ratio — ABNORMAL HIGH (ref 0.8–1.0)
Prothrombin Time: 22.7 s — ABNORMAL HIGH (ref 9.6–13.1)

## 2016-11-18 MED ORDER — SILDENAFIL CITRATE 100 MG PO TABS: 100.0000 mg | ORAL_TABLET | ORAL | 1 refills | Status: DC | PRN

## 2016-11-18 MED ORDER — ALPRAZOLAM 1 MG PO TABS: 0.5000 mg | ORAL_TABLET | Freq: Two times a day (BID) | ORAL | 1 refills | Status: DC | PRN

## 2016-11-18 NOTE — Assessment & Plan Note (Signed)
On Xanax prn  Potential benefits of a long term benzodiazepines use as well as potential risks  and complications were explained to the patient and were aknowledged. 

## 2016-11-18 NOTE — Assessment & Plan Note (Signed)
RLE - post-DVT Cont w/wt loss

## 2016-11-18 NOTE — Progress Notes (Signed)
Subjective:  Patient ID: Joseph [name], male    DOB: [birth year]  Age: 66 y.o. MRN: [id number]  CC: No chief complaint on file.   HPI Joseph [name] presents for DVT, obesity, ED f/u  Outpatient Medications Prior to Visit  Medication Sig Dispense Refill  . ALPRAZolam (XANAX) 1 MG tablet Take 0.5-1 tablets (0.5-1 mg total) by mouth 2 (two) times daily as needed for anxiety. 180 tablet 1  . Cholecalciferol 1000 UNITS tablet Take 1,000 Units by mouth daily. Reported on 04/24/2015    . levothyroxine (SYNTHROID, LEVOTHROID) 25 MCG tablet Take 1 tablet (25 mcg total) by mouth daily. 90 tablet 3  . sildenafil (VIAGRA) 100 MG tablet Take 1 tablet (100 mg total) by mouth as needed for erectile dysfunction. 80 tablet 0  . warfarin (COUMADIN) 10 MG tablet TAKE 1 TABLET BY MOUTH AS DIRECTED, THEN 1&1/2 TABLET ON MONDAY AND FRIDAY AND 1 TABLET ON ALL OTHER DAYS. 120 tablet 3   No facility-administered medications prior to visit.     ROS Review of Systems  Constitutional: Negative for appetite change, fatigue and unexpected weight change.  HENT: Negative for congestion, nosebleeds, sneezing, sore throat and trouble swallowing.   Eyes: Negative for itching and visual disturbance.  Respiratory: Negative for cough.   Cardiovascular: Negative for chest pain, palpitations and leg swelling.  Gastrointestinal: Negative for abdominal distention, blood in stool, diarrhea and nausea.  Genitourinary: Negative for frequency and hematuria.  Musculoskeletal: Negative for back pain, gait problem, joint swelling and neck pain.  Skin: Negative for rash.  Neurological: Negative for dizziness, tremors, speech difficulty and weakness.  Psychiatric/Behavioral: Negative for agitation, dysphoric mood and sleep disturbance. The patient is not nervous/anxious.     Objective:  BP 126/86 (BP Location: Left Arm, Patient Position: Sitting, Cuff Size: Large)   Pulse (!) 53   Temp 98.5 F (36.9 C) (Oral)   Ht 6\' 3"   (1.905 m)   Wt 286 lb (129.7 kg)   SpO2 98%   BMI 35.75 kg/m   BP Readings from Last 3 Encounters:  11/18/16 126/86  08/19/16 124/78  05/20/16 138/82    Wt Readings from Last 3 Encounters:  11/18/16 286 lb (129.7 kg)  08/19/16 300 lb (136.1 kg)  05/20/16 293 lb 1.9 oz (133 kg)    Physical Exam  Constitutional: He is oriented to person, place, and time. He appears well-developed. No distress.  NAD  HENT:  Mouth/Throat: Oropharynx is clear and moist.  Eyes: Pupils are equal, round, and reactive to light. Conjunctivae are normal.  Neck: Normal range of motion. No JVD present. No thyromegaly present.  Cardiovascular: Normal rate, regular rhythm, normal heart sounds and intact distal pulses.  Exam reveals no gallop and no friction rub.   No murmur heard. Pulmonary/Chest: Effort normal and breath sounds normal. No respiratory distress. He has no wheezes. He has no rales. He exhibits no tenderness.  Abdominal: Soft. Bowel sounds are normal. He exhibits no distension and no mass. There is no tenderness. There is no rebound and no guarding.  Musculoskeletal: Normal range of motion. He exhibits no edema or tenderness.  Lymphadenopathy:    He has no cervical adenopathy.  Neurological: He is alert and oriented to person, place, and time. He has normal reflexes. No cranial nerve deficit. He exhibits normal muscle tone. He displays a negative Romberg sign. Coordination and gait normal.  Skin: Skin is warm and dry. No rash noted.  Psychiatric: He has a normal mood and affect.  His behavior is normal. Judgment and thought content normal.    Lab Results  Component Value Date   WBC 9.2 02/19/2016   HGB 14.8 02/19/2016   HCT 42.5 02/19/2016   PLT 370.0 02/19/2016   GLUCOSE 98 02/19/2016   CHOL 142 10/23/2015   TRIG 114.0 10/23/2015   HDL 52.80 10/23/2015   LDLCALC 67 10/23/2015   ALT 18 02/19/2016   AST 16 02/19/2016   NA 138 02/19/2016   K 4.2 02/19/2016   CL 105 02/19/2016    CREATININE 1.02 02/19/2016   BUN 16 02/19/2016   CO2 26 02/19/2016   TSH 1.04 10/23/2015   PSA 0.51 10/23/2015   INR 2.1 (H) 11/18/2016   HGBA1C 6.1 06/17/2010    No results found.  Assessment & Plan:   There are no diagnoses linked to this encounter. I am having Mr. Dimaria maintain his Cholecalciferol, sildenafil, levothyroxine, ALPRAZolam, and warfarin.  No orders of the defined types were placed in this encounter.    Follow-up: No Follow-up on file.  [name], MD

## 2016-12-29 ENCOUNTER — Encounter: Payer: Self-pay | Admitting: Internal Medicine

## 2016-12-29 NOTE — Assessment & Plan Note (Signed)
Levoxyl 

## 2016-12-29 NOTE — Assessment & Plan Note (Signed)
Wt Readings from Last 3 Encounters:  11/18/16 286 lb (129.7 kg)  08/19/16 300 lb (136.1 kg)  05/20/16 293 lb 1.9 oz (133 kg)

## 2017-02-17 ENCOUNTER — Encounter: Payer: Self-pay | Admitting: Internal Medicine

## 2017-02-17 ENCOUNTER — Other Ambulatory Visit (INDEPENDENT_AMBULATORY_CARE_PROVIDER_SITE_OTHER): Payer: Medicare Other

## 2017-02-17 ENCOUNTER — Ambulatory Visit (INDEPENDENT_AMBULATORY_CARE_PROVIDER_SITE_OTHER): Payer: Medicare Other | Admitting: Internal Medicine

## 2017-02-17 DIAGNOSIS — R350 Frequency of micturition: Secondary | ICD-10-CM

## 2017-02-17 DIAGNOSIS — F411 Generalized anxiety disorder: Secondary | ICD-10-CM

## 2017-02-17 DIAGNOSIS — R030 Elevated blood-pressure reading, without diagnosis of hypertension: Secondary | ICD-10-CM | POA: Diagnosis not present

## 2017-02-17 DIAGNOSIS — Z86718 Personal history of other venous thrombosis and embolism: Secondary | ICD-10-CM | POA: Diagnosis not present

## 2017-02-17 DIAGNOSIS — R600 Localized edema: Secondary | ICD-10-CM

## 2017-02-17 DIAGNOSIS — N401 Enlarged prostate with lower urinary tract symptoms: Secondary | ICD-10-CM

## 2017-02-17 DIAGNOSIS — E039 Hypothyroidism, unspecified: Secondary | ICD-10-CM | POA: Diagnosis not present

## 2017-02-17 DIAGNOSIS — F528 Other sexual dysfunction not due to a substance or known physiological condition: Secondary | ICD-10-CM | POA: Diagnosis not present

## 2017-02-17 LAB — BASIC METABOLIC PANEL
BUN: 22 mg/dL (ref 6–23)
CO2: 26 mEq/L (ref 19–32)
Calcium: 9.1 mg/dL (ref 8.4–10.5)
Chloride: 105 mEq/L (ref 96–112)
Creatinine, Ser: 1 mg/dL (ref 0.40–1.50)
GFR: 79.33 mL/min (ref >60.00)
Glucose, Bld: 121 mg/dL — ABNORMAL HIGH (ref 70–99)
Potassium: 4.1 mEq/L (ref 3.5–5.1)
Sodium: 139 mEq/L (ref 135–145)

## 2017-02-17 LAB — CBC WITH DIFFERENTIAL/PLATELET
Basophils Absolute: 0.1 10*3/uL (ref 0.0–0.1)
Basophils Relative: 1 % (ref 0.0–3.0)
Eosinophils Absolute: 0.1 10*3/uL (ref 0.0–0.7)
Eosinophils Relative: 1.1 % (ref 0.0–5.0)
HCT: 42.3 % (ref 39.0–52.0)
Hemoglobin: 14.5 g/dL (ref 13.0–17.0)
Lymphocytes Relative: 18.3 % (ref 12.0–46.0)
Lymphs Abs: 1.7 10*3/uL (ref 0.7–4.0)
MCHC: 34.2 g/dL (ref 30.0–36.0)
MCV: 97.4 fl (ref 78.0–100.0)
Monocytes Absolute: 0.6 10*3/uL (ref 0.1–1.0)
Monocytes Relative: 6.6 % (ref 3.0–12.0)
Neutro Abs: 6.7 10*3/uL (ref 1.4–7.7)
Neutrophils Relative %: 73 % (ref 43.0–77.0)
Platelets: 366 10*3/uL (ref 150.0–400.0)
RBC: 4.35 Mil/uL (ref 4.22–5.81)
RDW: 13.2 % (ref 11.5–15.5)
WBC: 9.2 10*3/uL (ref 4.0–10.5)

## 2017-02-17 LAB — HEPATIC FUNCTION PANEL
ALT: 20 U/L (ref 0–53)
AST: 19 U/L (ref 0–37)
Albumin: 4.1 g/dL (ref 3.5–5.2)
Alkaline Phosphatase: 60 U/L (ref 39–117)
Bilirubin, Direct: 0.1 mg/dL (ref 0.0–0.3)
Total Bilirubin: 0.5 mg/dL (ref 0.2–1.2)
Total Protein: 6.7 g/dL (ref 6.0–8.3)

## 2017-02-17 LAB — URINALYSIS
Bilirubin Urine: NEGATIVE
Hgb urine dipstick: NEGATIVE
Ketones, ur: NEGATIVE
Leukocytes, UA: NEGATIVE
Nitrite: NEGATIVE
Specific Gravity, Urine: >=1.03 — AB (ref 1.000–1.030)
Total Protein, Urine: NEGATIVE
Urine Glucose: NEGATIVE
Urobilinogen, UA: 0.2 (ref 0.0–1.0)
pH: 5 (ref 5.0–8.0)

## 2017-02-17 LAB — TSH: TSH: 1.19 u[IU]/mL (ref 0.35–4.50)

## 2017-02-17 LAB — PSA: PSA: 0.58 ng/mL (ref 0.10–4.00)

## 2017-02-17 MED ORDER — ALPRAZOLAM 1 MG PO TABS: 0.5000 mg | ORAL_TABLET | Freq: Two times a day (BID) | ORAL | 1 refills | Status: DC | PRN

## 2017-02-17 MED ORDER — SILDENAFIL CITRATE 100 MG PO TABS: 100.0000 mg | ORAL_TABLET | ORAL | 1 refills | Status: DC | PRN

## 2017-02-17 NOTE — Assessment & Plan Note (Signed)
BP Readings from Last 3 Encounters:  02/17/17 134/72  11/18/16 126/86  08/19/16 124/78

## 2017-02-17 NOTE — Assessment & Plan Note (Signed)
On Levoxyl 

## 2017-02-17 NOTE — Assessment & Plan Note (Signed)
Viagra prn 

## 2017-02-17 NOTE — Assessment & Plan Note (Signed)
Coumadin 

## 2017-02-17 NOTE — Assessment & Plan Note (Addendum)
Compression socks 

## 2017-02-17 NOTE — Assessment & Plan Note (Signed)
PSA

## 2017-02-17 NOTE — Assessment & Plan Note (Signed)
On Xanax prn  Potential benefits of a long term benzodiazepines use as well as potential risks  and complications were explained to the patient and were aknowledged. 

## 2017-02-17 NOTE — Progress Notes (Signed)
Subjective:  Patient ID: Joseph [name], male    DOB: 1950/09/27  Age: 67 y.o. MRN: [id number]  CC: No chief complaint on file.   HPI Joseph [name] presents for anxiety, hypothyroidism, ED f/u. Knee hurts  Outpatient Medications Prior to Visit  Medication Sig Dispense Refill  . ALPRAZolam (XANAX) 1 MG tablet Take 0.5-1 tablets (0.5-1 mg total) by mouth 2 (two) times daily as needed for anxiety. 180 tablet 1  . Cholecalciferol 1000 UNITS tablet Take 1,000 Units by mouth daily. Reported on 04/24/2015    . levothyroxine (SYNTHROID, LEVOTHROID) 25 MCG tablet Take 1 tablet (25 mcg total) by mouth daily. 90 tablet 3  . sildenafil (VIAGRA) 100 MG tablet Take 1 tablet (100 mg total) by mouth as needed for erectile dysfunction. 80 tablet 1  . warfarin (COUMADIN) 10 MG tablet TAKE 1 TABLET BY MOUTH AS DIRECTED, THEN 1&1/2 TABLET ON MONDAY AND FRIDAY AND 1 TABLET ON ALL OTHER DAYS. 120 tablet 3   No facility-administered medications prior to visit.     ROS Review of Systems  Constitutional: Negative for appetite change, fatigue and unexpected weight change.  HENT: Negative for congestion, nosebleeds, sneezing, sore throat and trouble swallowing.   Eyes: Negative for itching and visual disturbance.  Respiratory: Negative for cough.   Cardiovascular: Positive for leg swelling. Negative for chest pain and palpitations.  Gastrointestinal: Negative for abdominal distention, blood in stool, diarrhea and nausea.  Genitourinary: Negative for frequency and hematuria.  Musculoskeletal: Positive for back pain. Negative for gait problem, joint swelling and neck pain.  Skin: Negative for rash.  Neurological: Negative for dizziness, tremors, speech difficulty and weakness.  Psychiatric/Behavioral: Negative for agitation, dysphoric mood and sleep disturbance. The patient is not nervous/anxious.     Objective:  BP 134/72 (BP Location: Left Arm, Patient Position: Sitting, Cuff Size: Large)   Pulse 83    Temp 98.8 F (37.1 C) (Oral)   Ht 6\' 3"  (1.905 m)   Wt 292 lb (132.5 kg)   SpO2 99%   BMI 36.50 kg/m   BP Readings from Last 3 Encounters:  02/17/17 134/72  11/18/16 126/86  08/19/16 124/78    Wt Readings from Last 3 Encounters:  02/17/17 292 lb (132.5 kg)  11/18/16 286 lb (129.7 kg)  08/19/16 300 lb (136.1 kg)    Physical Exam  Constitutional: He is oriented to person, place, and time. He appears well-developed. No distress.  NAD  HENT:  Mouth/Throat: Oropharynx is clear and moist.  Eyes: Conjunctivae are normal. Pupils are equal, round, and reactive to light.  Neck: Normal range of motion. No JVD present. No thyromegaly present.  Cardiovascular: Normal rate, regular rhythm, normal heart sounds and intact distal pulses. Exam reveals no gallop and no friction rub.  No murmur heard. Pulmonary/Chest: Effort normal and breath sounds normal. No respiratory distress. He has no wheezes. He has no rales. He exhibits no tenderness.  Abdominal: Soft. Bowel sounds are normal. He exhibits no distension and no mass. There is no tenderness. There is no rebound and no guarding.  Musculoskeletal: Normal range of motion. He exhibits edema. He exhibits no tenderness.  Lymphadenopathy:    He has no cervical adenopathy.  Neurological: He is alert and oriented to person, place, and time. He has normal reflexes. No cranial nerve deficit. He exhibits normal muscle tone. He displays a negative Romberg sign. Coordination and gait normal.  Skin: Skin is warm and dry. No rash noted.  Psychiatric: He has a normal mood  and affect. His behavior is normal. Judgment and thought content normal.    Lab Results  Component Value Date   WBC 9.2 02/19/2016   HGB 14.8 02/19/2016   HCT 42.5 02/19/2016   PLT 370.0 02/19/2016   GLUCOSE 98 02/19/2016   CHOL 142 10/23/2015   TRIG 114.0 10/23/2015   HDL 52.80 10/23/2015   LDLCALC 67 10/23/2015   ALT 18 02/19/2016   AST 16 02/19/2016   NA 138 02/19/2016   K  4.2 02/19/2016   CL 105 02/19/2016   CREATININE 1.02 02/19/2016   BUN 16 02/19/2016   CO2 26 02/19/2016   TSH 1.04 10/23/2015   PSA 0.51 10/23/2015   INR 2.1 (H) 11/18/2016   HGBA1C 6.1 06/17/2010    No results found.  Assessment & Plan:   There are no diagnoses linked to this encounter. I am having [name] maintain his Cholecalciferol, levothyroxine, warfarin, sildenafil, and ALPRAZolam.  No orders of the defined types were placed in this encounter.    Follow-up: No Follow-up on file.  [name], MD

## 2017-05-19 ENCOUNTER — Encounter: Payer: Self-pay | Admitting: Internal Medicine

## 2017-05-19 ENCOUNTER — Other Ambulatory Visit (INDEPENDENT_AMBULATORY_CARE_PROVIDER_SITE_OTHER): Payer: Medicare Other

## 2017-05-19 ENCOUNTER — Ambulatory Visit (INDEPENDENT_AMBULATORY_CARE_PROVIDER_SITE_OTHER): Payer: Medicare Other | Admitting: Internal Medicine

## 2017-05-19 VITALS — BP 134/82 | HR 65 | Temp 98.6°F | Ht 75.0 in | Wt 302.0 lb

## 2017-05-19 DIAGNOSIS — Z7901 Long term (current) use of anticoagulants: Secondary | ICD-10-CM | POA: Diagnosis not present

## 2017-05-19 DIAGNOSIS — E039 Hypothyroidism, unspecified: Secondary | ICD-10-CM

## 2017-05-19 DIAGNOSIS — Z23 Encounter for immunization: Secondary | ICD-10-CM | POA: Diagnosis not present

## 2017-05-19 DIAGNOSIS — F411 Generalized anxiety disorder: Secondary | ICD-10-CM | POA: Diagnosis not present

## 2017-05-19 DIAGNOSIS — Z6836 Body mass index (BMI) 36.0-36.9, adult: Secondary | ICD-10-CM | POA: Diagnosis not present

## 2017-05-19 DIAGNOSIS — I872 Venous insufficiency (chronic) (peripheral): Secondary | ICD-10-CM | POA: Diagnosis not present

## 2017-05-19 DIAGNOSIS — R600 Localized edema: Secondary | ICD-10-CM | POA: Diagnosis not present

## 2017-05-19 LAB — PROTIME-INR
INR: 2.9 ratio — ABNORMAL HIGH (ref 0.8–1.0)
Prothrombin Time: 33.6 s — ABNORMAL HIGH (ref 9.6–13.1)

## 2017-05-19 NOTE — Assessment & Plan Note (Signed)
Compression sock

## 2017-05-19 NOTE — Assessment & Plan Note (Signed)
RLE - post-DVT

## 2017-05-19 NOTE — Assessment & Plan Note (Signed)
Xanax prn  Potential benefits of a long term benzodiazepines  use as well as potential risks  and complications were explained to the patient and were aknowledged. 

## 2017-05-19 NOTE — Patient Instructions (Signed)
Wt Readings from Last 3 Encounters:  05/19/17 (!) 302 lb (137 kg)  02/17/17 292 lb (132.5 kg)  11/18/16 286 lb (129.7 kg)

## 2017-05-19 NOTE — Progress Notes (Signed)
Subjective:  Patient ID: Joseph [name], male    DOB: 1951/01/03  Age: 67 y.o. MRN: [id number]  CC: No chief complaint on file.   HPI Joseph [name] presents for anxiety, ED, edema f/u  Outpatient Medications Prior to Visit  Medication Sig Dispense Refill  . ALPRAZolam (XANAX) 1 MG tablet Take 0.5-1 tablets (0.5-1 mg total) by mouth 2 (two) times daily as needed for anxiety. 180 tablet 1  . Cholecalciferol 1000 UNITS tablet Take 1,000 Units by mouth daily. Reported on 04/24/2015    . levothyroxine (SYNTHROID, LEVOTHROID) 25 MCG tablet Take 1 tablet (25 mcg total) by mouth daily. 90 tablet 3  . sildenafil (VIAGRA) 100 MG tablet Take 1 tablet (100 mg total) by mouth as needed for erectile dysfunction. 80 tablet 1  . warfarin (COUMADIN) 10 MG tablet TAKE 1 TABLET BY MOUTH AS DIRECTED, THEN 1&1/2 TABLET ON MONDAY AND FRIDAY AND 1 TABLET ON ALL OTHER DAYS. 120 tablet 3   No facility-administered medications prior to visit.     ROS Review of Systems  Constitutional: Positive for unexpected weight change. Negative for appetite change and fatigue.  HENT: Negative for congestion, nosebleeds, sneezing, sore throat and trouble swallowing.   Eyes: Negative for itching and visual disturbance.  Respiratory: Negative for cough.   Cardiovascular: Positive for leg swelling. Negative for chest pain and palpitations.  Gastrointestinal: Negative for abdominal distention, blood in stool, diarrhea and nausea.  Genitourinary: Negative for frequency and hematuria.  Musculoskeletal: Positive for arthralgias and gait problem. Negative for back pain, joint swelling and neck pain.  Skin: Negative for rash.  Neurological: Negative for dizziness, tremors, speech difficulty and weakness.  Psychiatric/Behavioral: Negative for agitation, dysphoric mood and sleep disturbance. The patient is not nervous/anxious.      Objective:  BP 134/82 (BP Location: Left Arm, Patient Position: Sitting, Cuff Size: Large)    Pulse 65   Temp 98.6 F (37 C) (Oral)   Ht 6\' 3"  (1.905 m)   Wt (!) 302 lb (137 kg)   SpO2 98%   BMI 37.75 kg/m   BP Readings from Last 3 Encounters:  05/19/17 134/82  02/17/17 134/72  11/18/16 126/86    Wt Readings from Last 3 Encounters:  05/19/17 (!) 302 lb (137 kg)  02/17/17 292 lb (132.5 kg)  11/18/16 286 lb (129.7 kg)    Physical Exam  Constitutional: He is oriented to person, place, and time. He appears well-developed. No distress.  NAD  HENT:  Mouth/Throat: Oropharynx is clear and moist.  Eyes: Pupils are equal, round, and reactive to light. Conjunctivae are normal.  Neck: Normal range of motion. No JVD present. No thyromegaly present.  Cardiovascular: Normal rate, regular rhythm, normal heart sounds and intact distal pulses. Exam reveals no gallop and no friction rub.  No murmur heard. Pulmonary/Chest: Effort normal and breath sounds normal. No respiratory distress. He has no wheezes. He has no rales. He exhibits no tenderness.  Abdominal: Soft. Bowel sounds are normal. He exhibits no distension and no mass. There is no tenderness. There is no rebound and no guarding.  Musculoskeletal: Normal range of motion. He exhibits tenderness. He exhibits no edema.  Lymphadenopathy:    He has no cervical adenopathy.  Neurological: He is alert and oriented to person, place, and time. He has normal reflexes. No cranial nerve deficit. He exhibits normal muscle tone. He displays a negative Romberg sign. Gait normal.  Skin: Skin is warm and dry. No rash noted.  Psychiatric: He has  a normal mood and affect. His behavior is normal. Judgment and thought content normal.   Obese Leg edema  Lab Results  Component Value Date   WBC 9.2 02/17/2017   HGB 14.5 02/17/2017   HCT 42.3 02/17/2017   PLT 366.0 02/17/2017   GLUCOSE 121 (H) 02/17/2017   CHOL 142 10/23/2015   TRIG 114.0 10/23/2015   HDL 52.80 10/23/2015   LDLCALC 67 10/23/2015   ALT 20 02/17/2017   AST 19 02/17/2017   NA  139 02/17/2017   K 4.1 02/17/2017   CL 105 02/17/2017   CREATININE 1.00 02/17/2017   BUN 22 02/17/2017   CO2 26 02/17/2017   TSH 1.19 02/17/2017   PSA 0.58 02/17/2017   INR 2.1 (H) 11/18/2016   HGBA1C 6.1 06/17/2010    No results found.  Assessment & Plan:   There are no diagnoses linked to this encounter. I am having [name] maintain his Cholecalciferol, levothyroxine, warfarin, sildenafil, and ALPRAZolam.  No orders of the defined types were placed in this encounter.    Follow-up: No follow-ups on file.  [name], MD

## 2017-05-19 NOTE — Addendum Note (Signed)
Addended by: Karren Cobble on: 05/19/2017 03:30 PM   Modules accepted: Orders

## 2017-05-19 NOTE — Assessment & Plan Note (Signed)
labs

## 2017-05-19 NOTE — Assessment & Plan Note (Signed)
Worse 

## 2017-08-18 ENCOUNTER — Ambulatory Visit (INDEPENDENT_AMBULATORY_CARE_PROVIDER_SITE_OTHER): Payer: Medicare Other | Admitting: Internal Medicine

## 2017-08-18 ENCOUNTER — Other Ambulatory Visit (INDEPENDENT_AMBULATORY_CARE_PROVIDER_SITE_OTHER): Payer: Medicare Other

## 2017-08-18 ENCOUNTER — Encounter: Payer: Self-pay | Admitting: Internal Medicine

## 2017-08-18 DIAGNOSIS — E039 Hypothyroidism, unspecified: Secondary | ICD-10-CM

## 2017-08-18 DIAGNOSIS — F411 Generalized anxiety disorder: Secondary | ICD-10-CM

## 2017-08-18 DIAGNOSIS — Z6836 Body mass index (BMI) 36.0-36.9, adult: Secondary | ICD-10-CM | POA: Diagnosis not present

## 2017-08-18 DIAGNOSIS — Z7901 Long term (current) use of anticoagulants: Secondary | ICD-10-CM

## 2017-08-18 LAB — PROTIME-INR
INR: 2.2 ratio — ABNORMAL HIGH (ref 0.8–1.0)
Prothrombin Time: 25.6 s — ABNORMAL HIGH (ref 9.6–13.1)

## 2017-08-18 MED ORDER — ALPRAZOLAM 1 MG PO TABS: 0.5000 mg | ORAL_TABLET | Freq: Two times a day (BID) | ORAL | 1 refills | Status: DC | PRN

## 2017-08-18 MED ORDER — WARFARIN SODIUM 10 MG PO TABS: ORAL_TABLET | ORAL | 3 refills | Status: DC

## 2017-08-18 MED ORDER — SILDENAFIL CITRATE 100 MG PO TABS: 100.0000 mg | ORAL_TABLET | ORAL | 1 refills | Status: DC | PRN

## 2017-08-18 MED ORDER — LEVOTHYROXINE SODIUM 25 MCG PO TABS: 25.0000 ug | ORAL_TABLET | Freq: Every day | ORAL | 3 refills | Status: DC

## 2017-08-18 NOTE — Progress Notes (Signed)
Subjective:  Patient ID: Joseph [name], male    DOB: 1950/10/03  Age: 67 y.o. MRN: [id number]  CC: No chief complaint on file.   HPI Joseph [name] presents for DVT, anxiety, hypothyroidism f/u    Outpatient Medications Prior to Visit  Medication Sig Dispense Refill  . ALPRAZolam (XANAX) 1 MG tablet Take 0.5-1 tablets (0.5-1 mg total) by mouth 2 (two) times daily as needed for anxiety. 180 tablet 1  . Cholecalciferol 1000 UNITS tablet Take 1,000 Units by mouth daily. Reported on 04/24/2015    . levothyroxine (SYNTHROID, LEVOTHROID) 25 MCG tablet Take 1 tablet (25 mcg total) by mouth daily. 90 tablet 3  . sildenafil (VIAGRA) 100 MG tablet Take 1 tablet (100 mg total) by mouth as needed for erectile dysfunction. 80 tablet 1  . warfarin (COUMADIN) 10 MG tablet TAKE 1 TABLET BY MOUTH AS DIRECTED, THEN 1&1/2 TABLET ON MONDAY AND FRIDAY AND 1 TABLET ON ALL OTHER DAYS. 120 tablet 3   No facility-administered medications prior to visit.     ROS: Review of Systems  Constitutional: Positive for unexpected weight change. Negative for appetite change and fatigue.  HENT: Negative for congestion, nosebleeds, sneezing, sore throat and trouble swallowing.   Eyes: Negative for itching and visual disturbance.  Respiratory: Negative for cough.   Cardiovascular: Positive for leg swelling. Negative for chest pain and palpitations.  Gastrointestinal: Negative for abdominal distention, blood in stool, diarrhea and nausea.  Genitourinary: Negative for frequency and hematuria.  Musculoskeletal: Positive for arthralgias. Negative for back pain, gait problem, joint swelling and neck pain.  Skin: Negative for rash.  Neurological: Negative for dizziness, tremors, speech difficulty and weakness.  Psychiatric/Behavioral: Negative for agitation, dysphoric mood, sleep disturbance and suicidal ideas. The patient is nervous/anxious.     Objective:  BP 136/84 (BP Location: Left Arm, Patient Position: Sitting,  Cuff Size: Large)   Pulse 63   Temp 98.3 F (36.8 C) (Oral)   Ht 6\' 3"  (1.905 m)   Wt (!) 304 lb (137.9 kg)   SpO2 97%   BMI 38.00 kg/m   BP Readings from Last 3 Encounters:  08/18/17 136/84  05/19/17 134/82  02/17/17 134/72    Wt Readings from Last 3 Encounters:  08/18/17 (!) 304 lb (137.9 kg)  05/19/17 (!) 302 lb (137 kg)  02/17/17 292 lb (132.5 kg)    Physical Exam  Constitutional: He is oriented to person, place, and time. He appears well-developed. No distress.  NAD  HENT:  Mouth/Throat: Oropharynx is clear and moist.  Eyes: Pupils are equal, round, and reactive to light. Conjunctivae are normal.  Neck: Normal range of motion. No JVD present. No thyromegaly present.  Cardiovascular: Normal rate, regular rhythm, normal heart sounds and intact distal pulses. Exam reveals no gallop and no friction rub.  No murmur heard. Pulmonary/Chest: Effort normal and breath sounds normal. No respiratory distress. He has no wheezes. He has no rales. He exhibits no tenderness.  Abdominal: Soft. Bowel sounds are normal. He exhibits no distension and no mass. There is no tenderness. There is no rebound and no guarding.  Musculoskeletal: Normal range of motion. He exhibits no edema or tenderness.  Lymphadenopathy:    He has no cervical adenopathy.  Neurological: He is alert and oriented to person, place, and time. He has normal reflexes. No cranial nerve deficit. He exhibits normal muscle tone. He displays a negative Romberg sign. Coordination and gait normal.  Skin: Skin is warm and dry. No rash noted.  Psychiatric: He has a normal mood and affect. His behavior is normal. Judgment and thought content normal.  obese RLE edema  Lab Results  Component Value Date   WBC 9.2 02/17/2017   HGB 14.5 02/17/2017   HCT 42.3 02/17/2017   PLT 366.0 02/17/2017   GLUCOSE 121 (H) 02/17/2017   CHOL 142 10/23/2015   TRIG 114.0 10/23/2015   HDL 52.80 10/23/2015   LDLCALC 67 10/23/2015   ALT 20  02/17/2017   AST 19 02/17/2017   NA 139 02/17/2017   K 4.1 02/17/2017   CL 105 02/17/2017   CREATININE 1.00 02/17/2017   BUN 22 02/17/2017   CO2 26 02/17/2017   TSH 1.19 02/17/2017   PSA 0.58 02/17/2017   INR 2.9 (H) 05/19/2017   HGBA1C 6.1 06/17/2010    No results found.  Assessment & Plan:   There are no diagnoses linked to this encounter.   No orders of the defined types were placed in this encounter.    Follow-up: No follow-ups on file.  [name], MD

## 2017-08-18 NOTE — Assessment & Plan Note (Signed)
On Levoxyl 

## 2017-08-18 NOTE — Assessment & Plan Note (Signed)
Wt Readings from Last 3 Encounters:  08/18/17 (!) 304 lb (137.9 kg)  05/19/17 (!) 302 lb (137 kg)  02/17/17 292 lb (132.5 kg)

## 2017-08-18 NOTE — Assessment & Plan Note (Signed)
Coumadin 

## 2017-08-18 NOTE — Assessment & Plan Note (Signed)
On Xanax prn  Potential benefits of a long term benzodiazepines use as well as potential risks  and complications were explained to the patient and were aknowledged. 

## 2017-10-18 ENCOUNTER — Other Ambulatory Visit: Payer: Self-pay | Admitting: Internal Medicine

## 2017-11-17 ENCOUNTER — Encounter: Payer: Self-pay | Admitting: Internal Medicine

## 2017-11-17 ENCOUNTER — Other Ambulatory Visit (INDEPENDENT_AMBULATORY_CARE_PROVIDER_SITE_OTHER): Payer: Medicare Other

## 2017-11-17 ENCOUNTER — Ambulatory Visit (INDEPENDENT_AMBULATORY_CARE_PROVIDER_SITE_OTHER): Payer: Medicare Other | Admitting: Internal Medicine

## 2017-11-17 VITALS — BP 126/84 | HR 60 | Temp 98.3°F | Ht 75.0 in | Wt 305.0 lb

## 2017-11-17 DIAGNOSIS — Z7901 Long term (current) use of anticoagulants: Secondary | ICD-10-CM

## 2017-11-17 DIAGNOSIS — F411 Generalized anxiety disorder: Secondary | ICD-10-CM | POA: Diagnosis not present

## 2017-11-17 DIAGNOSIS — Z23 Encounter for immunization: Secondary | ICD-10-CM

## 2017-11-17 DIAGNOSIS — Z6836 Body mass index (BMI) 36.0-36.9, adult: Secondary | ICD-10-CM

## 2017-11-17 LAB — PROTIME-INR
INR: 2.7 ratio — ABNORMAL HIGH (ref 0.8–1.0)
Prothrombin Time: 30.6 s — ABNORMAL HIGH (ref 9.6–13.1)

## 2017-11-17 MED ORDER — LEVOTHYROXINE SODIUM 25 MCG PO TABS: 25.0000 ug | ORAL_TABLET | Freq: Every day | ORAL | 3 refills | Status: DC

## 2017-11-17 MED ORDER — ALPRAZOLAM 1 MG PO TABS: 0.5000 mg | ORAL_TABLET | Freq: Two times a day (BID) | ORAL | 1 refills | Status: DC | PRN

## 2017-11-17 NOTE — Assessment & Plan Note (Signed)
Low carb diet 

## 2017-11-17 NOTE — Patient Instructions (Signed)
Wt Readings from Last 3 Encounters:  11/17/17 (!) 305 lb (138.3 kg)  08/18/17 (!) 304 lb (137.9 kg)  05/19/17 (!) 302 lb (137 kg)

## 2017-11-17 NOTE — Addendum Note (Signed)
Addended by: Karren Cobble on: 11/17/2017 03:31 PM   Modules accepted: Orders

## 2017-11-17 NOTE — Assessment & Plan Note (Signed)
On Xanax prn  Potential benefits of a long term benzodiazepines use as well as potential risks  and complications were explained to the patient and were aknowledged. 

## 2017-11-17 NOTE — Progress Notes (Signed)
Subjective:  Patient ID: Joseph [name], male    DOB: [birth year]  Age: 67 y.o. MRN: [id number]  CC: No chief complaint on file.   HPI Joseph [name] presents for anxiety, obesity, hypothyroidism f/u  Outpatient Medications Prior to Visit  Medication Sig Dispense Refill  . ALPRAZolam (XANAX) 1 MG tablet Take 0.5-1 tablets (0.5-1 mg total) by mouth 2 (two) times daily as needed for anxiety. 180 tablet 1  . Cholecalciferol 1000 UNITS tablet Take 1,000 Units by mouth daily. Reported on 04/24/2015    . levothyroxine (SYNTHROID, LEVOTHROID) 25 MCG tablet Take 1 tablet (25 mcg total) by mouth daily. 90 tablet 3  . sildenafil (VIAGRA) 100 MG tablet Take 1 tablet (100 mg total) by mouth as needed for erectile dysfunction. 80 tablet 1  . warfarin (COUMADIN) 10 MG tablet TAKE 1 TABLET BY MOUTH AS DIRECTED, THEN 1&1/2 TABLET ON MONDAY AND FRIDAY AND 1 TABLET ON ALL OTHER DAYS. 120 tablet 3  . warfarin (COUMADIN) 10 MG tablet TAKE ONE AND ONE-HALF (1 AND 1/2) TABLETS BY MOUTH ON MONDAYS AND FRIDAYS AND TAKE ONE TABLET BY MOUTH DAILY ON ALL OTHER DAYS 120 tablet 3   No facility-administered medications prior to visit.     ROS: Review of Systems  Constitutional: Negative for appetite change, fatigue and unexpected weight change.  HENT: Negative for congestion, nosebleeds, sneezing, sore throat and trouble swallowing.   Eyes: Negative for itching and visual disturbance.  Respiratory: Negative for cough.   Cardiovascular: Negative for chest pain, palpitations and leg swelling.  Gastrointestinal: Negative for abdominal distention, blood in stool, diarrhea and nausea.  Genitourinary: Negative for frequency and hematuria.  Musculoskeletal: Negative for back pain, gait problem, joint swelling and neck pain.  Skin: Negative for rash.  Neurological: Negative for dizziness, tremors, speech difficulty and weakness.  Psychiatric/Behavioral: Negative for agitation, dysphoric mood, sleep disturbance and  suicidal ideas. The patient is not nervous/anxious.     Objective:  BP 126/84 (BP Location: Left Arm, Patient Position: Sitting, Cuff Size: Large)   Pulse 60   Temp 98.3 F (36.8 C) (Oral)   Ht 6\' 3"  (1.905 m)   Wt (!) 305 lb (138.3 kg)   SpO2 96%   BMI 38.12 kg/m   BP Readings from Last 3 Encounters:  11/17/17 126/84  08/18/17 136/84  05/19/17 134/82    Wt Readings from Last 3 Encounters:  11/17/17 (!) 305 lb (138.3 kg)  08/18/17 (!) 304 lb (137.9 kg)  05/19/17 (!) 302 lb (137 kg)    Physical Exam  Constitutional: He is oriented to person, place, and time. He appears well-developed. No distress.  NAD  HENT:  Mouth/Throat: Oropharynx is clear and moist.  Eyes: Pupils are equal, round, and reactive to light. Conjunctivae are normal.  Neck: Normal range of motion. No JVD present. No thyromegaly present.  Cardiovascular: Normal rate, regular rhythm, normal heart sounds and intact distal pulses. Exam reveals no gallop and no friction rub.  No murmur heard. Pulmonary/Chest: Effort normal and breath sounds normal. No respiratory distress. He has no wheezes. He has no rales. He exhibits no tenderness.  Abdominal: Soft. Bowel sounds are normal. He exhibits no distension and no mass. There is no tenderness. There is no rebound and no guarding.  Musculoskeletal: Normal range of motion. He exhibits no edema or tenderness.  Lymphadenopathy:    He has no cervical adenopathy.  Neurological: He is alert and oriented to person, place, and time. He has normal reflexes. No cranial  nerve deficit. He exhibits normal muscle tone. He displays a negative Romberg sign. Coordination and gait normal.  Skin: Skin is warm and dry. No rash noted.  Psychiatric: He has a normal mood and affect. His behavior is normal. Judgment and thought content normal.  Obese  Lab Results  Component Value Date   WBC 9.2 02/17/2017   HGB 14.5 02/17/2017   HCT 42.3 02/17/2017   PLT 366.0 02/17/2017   GLUCOSE 121  (H) 02/17/2017   CHOL 142 10/23/2015   TRIG 114.0 10/23/2015   HDL 52.80 10/23/2015   LDLCALC 67 10/23/2015   ALT 20 02/17/2017   AST 19 02/17/2017   NA 139 02/17/2017   K 4.1 02/17/2017   CL 105 02/17/2017   CREATININE 1.00 02/17/2017   BUN 22 02/17/2017   CO2 26 02/17/2017   TSH 1.19 02/17/2017   PSA 0.58 02/17/2017   INR 2.2 (H) 08/18/2017   HGBA1C 6.1 06/17/2010    No results found.  Assessment & Plan:   There are no diagnoses linked to this encounter.   No orders of the defined types were placed in this encounter.    Follow-up: No follow-ups on file.  [name], MD

## 2017-11-17 NOTE — Assessment & Plan Note (Signed)
Coumadin po 

## 2018-02-23 ENCOUNTER — Ambulatory Visit: Payer: Medicare Other | Admitting: Internal Medicine

## 2018-03-08 ENCOUNTER — Telehealth: Payer: Self-pay | Admitting: *Deleted

## 2018-03-08 NOTE — Telephone Encounter (Signed)
Called patient to confirm potential AWV on 03/09/18. Patient states he will not be able to stay for the AWV.

## 2018-03-09 ENCOUNTER — Ambulatory Visit: Payer: Medicare Other

## 2018-03-09 ENCOUNTER — Encounter: Payer: Self-pay | Admitting: Internal Medicine

## 2018-03-09 ENCOUNTER — Ambulatory Visit (INDEPENDENT_AMBULATORY_CARE_PROVIDER_SITE_OTHER): Payer: Medicare Other | Admitting: Internal Medicine

## 2018-03-09 ENCOUNTER — Other Ambulatory Visit (INDEPENDENT_AMBULATORY_CARE_PROVIDER_SITE_OTHER): Payer: Medicare Other

## 2018-03-09 DIAGNOSIS — Z7901 Long term (current) use of anticoagulants: Secondary | ICD-10-CM | POA: Diagnosis not present

## 2018-03-09 DIAGNOSIS — F411 Generalized anxiety disorder: Secondary | ICD-10-CM

## 2018-03-09 DIAGNOSIS — E039 Hypothyroidism, unspecified: Secondary | ICD-10-CM

## 2018-03-09 DIAGNOSIS — Z0000 Encounter for general adult medical examination without abnormal findings: Secondary | ICD-10-CM | POA: Diagnosis not present

## 2018-03-09 DIAGNOSIS — Z6836 Body mass index (BMI) 36.0-36.9, adult: Secondary | ICD-10-CM

## 2018-03-09 DIAGNOSIS — Z86718 Personal history of other venous thrombosis and embolism: Secondary | ICD-10-CM

## 2018-03-09 DIAGNOSIS — R413 Other amnesia: Secondary | ICD-10-CM | POA: Diagnosis not present

## 2018-03-09 LAB — PROTIME-INR
INR: 2.4 ratio — ABNORMAL HIGH (ref 0.8–1.0)
Prothrombin Time: 28 s — ABNORMAL HIGH (ref 9.6–13.1)

## 2018-03-09 MED ORDER — ALPRAZOLAM 1 MG PO TABS: 0.5000 mg | ORAL_TABLET | Freq: Two times a day (BID) | ORAL | 1 refills | Status: DC | PRN

## 2018-03-09 MED ORDER — LEVOTHYROXINE SODIUM 25 MCG PO TABS
25.0000 ug | ORAL_TABLET | Freq: Every day | ORAL | 3 refills | Status: DC
Start: 2018-03-09 — End: 2019-01-30

## 2018-03-09 MED ORDER — ZOSTER VAC RECOMB ADJUVANTED 50 MCG/0.5ML IM SUSR: 0.5000 mL | Freq: Once | INTRAMUSCULAR | 1 refills | Status: AC

## 2018-03-09 NOTE — Patient Instructions (Addendum)
Wt Readings from Last 3 Encounters:  03/09/18 (!) 301 lb (136.5 kg)  11/17/17 (!) 305 lb (138.3 kg)  08/18/17 (!) 304 lb (137.9 kg)   Cardiac CT calcium scoring test $150   Computed tomography, more commonly known as a CT or CAT scan, is a diagnostic medical imaging test. Like traditional x-rays, it produces multiple images or pictures of the inside of the body. The cross-sectional images generated during a CT scan can be reformatted in multiple planes. They can even generate three-dimensional images. These images can be viewed on a computer monitor, printed on film or by a 3D printer, or transferred to a CD or DVD. CT images of internal organs, bones, soft tissue and blood vessels provide greater detail than traditional x-rays, particularly of soft tissues and blood vessels. A cardiac CT scan for coronary calcium is a non-invasive way of obtaining information about the presence, location and extent of calcified plaque in the coronary arteries-the vessels that supply oxygen-containing blood to the heart muscle. Calcified plaque results when there is a build-up of fat and other substances under the inner layer of the artery. This material can calcify which signals the presence of atherosclerosis, a disease of the vessel wall, also called coronary artery disease (CAD). People with this disease have an increased risk for heart attacks. In addition, over time, progression of plaque build up (CAD) can narrow the arteries or even close off blood flow to the heart. The result may be chest pain, sometimes called "angina," or a heart attack. Because calcium is a marker of CAD, the amount of calcium detected on a cardiac CT scan is a helpful prognostic tool. The findings on cardiac CT are expressed as a calcium score. Another name for this test is coronary artery calcium scoring.  What are some common uses of the procedure? The goal of cardiac CT scan for calcium scoring is to determine if CAD is present and to  what extent, even if there are no symptoms. It is a screening study that may be recommended by a physician for patients with risk factors for CAD but no clinical symptoms. The major risk factors for CAD are: . high blood cholesterol levels  . family history of heart attacks  . diabetes  . high blood pressure  . cigarette smoking  . overweight or obese  . physical inactivity   A negative cardiac CT scan for calcium scoring shows no calcification within the coronary arteries. This suggests that CAD is absent or so minimal it cannot be seen by this technique. The chance of having a heart attack over the next two to five years is very low under these circumstances. A positive test means that CAD is present, regardless of whether or not the patient is experiencing any symptoms. The amount of calcification-expressed as the calcium score-may help to predict the likelihood of a myocardial infarction (heart attack) in the coming years and helps your medical doctor or cardiologist decide whether the patient may need to take preventive medicine or undertake other measures such as diet and exercise to lower the risk for heart attack. The extent of CAD is graded according to your calcium score:  Calcium Score  Presence of CAD  0 No evidence of CAD   1-10 Minimal evidence of CAD  11-100 Mild evidence of CAD  101-400 Moderate evidence of CAD  Over 400 Extensive evidence of CAD

## 2018-03-09 NOTE — Assessment & Plan Note (Signed)
Diet, fasting discussed

## 2018-03-09 NOTE — Progress Notes (Signed)
Subjective:  Patient ID: VA BROADWELL, male    DOB: December 19, 1950  Age: 68 y.o. MRN: [id number]  CC: No chief complaint on file.   HPI Joseph [name] presents for anxiety, DVT, hypothyroidism f/u  Outpatient Medications Prior to Visit  Medication Sig Dispense Refill  . ALPRAZolam (XANAX) 1 MG tablet Take 0.5-1 tablets (0.5-1 mg total) by mouth 2 (two) times daily as needed for anxiety. 180 tablet 1  . Cholecalciferol 1000 UNITS tablet Take 1,000 Units by mouth daily. Reported on 04/24/2015    . levothyroxine (SYNTHROID, LEVOTHROID) 25 MCG tablet Take 1 tablet (25 mcg total) by mouth daily. 90 tablet 3  . sildenafil (VIAGRA) 100 MG tablet Take 1 tablet (100 mg total) by mouth as needed for erectile dysfunction. 80 tablet 1  . warfarin (COUMADIN) 10 MG tablet TAKE 1 TABLET BY MOUTH AS DIRECTED, THEN 1&1/2 TABLET ON MONDAY AND FRIDAY AND 1 TABLET ON ALL OTHER DAYS. 120 tablet 3  . warfarin (COUMADIN) 10 MG tablet TAKE ONE AND ONE-HALF (1 AND 1/2) TABLETS BY MOUTH ON MONDAYS AND FRIDAYS AND TAKE ONE TABLET BY MOUTH DAILY ON ALL OTHER DAYS 120 tablet 3   No facility-administered medications prior to visit.     ROS: Review of Systems  Constitutional: Negative for appetite change, fatigue and unexpected weight change.  HENT: Negative for congestion, nosebleeds, sneezing, sore throat and trouble swallowing.   Eyes: Negative for itching and visual disturbance.  Respiratory: Negative for cough.   Cardiovascular: Negative for chest pain, palpitations and leg swelling.  Gastrointestinal: Negative for abdominal distention, blood in stool, diarrhea and nausea.  Genitourinary: Negative for frequency and hematuria.  Musculoskeletal: Positive for arthralgias. Negative for back pain, gait problem, joint swelling and neck pain.  Skin: Negative for rash.  Neurological: Negative for dizziness, tremors, speech difficulty and weakness.  Psychiatric/Behavioral: Positive for decreased concentration.  Negative for agitation, dysphoric mood, sleep disturbance and suicidal ideas. The patient is nervous/anxious.     Objective:  BP 126/84 (BP Location: Left Arm, Patient Position: Sitting, Cuff Size: Large)   Pulse 61   Temp 98.5 F (36.9 C) (Oral)   Ht 6\' 3"  (1.905 m)   Wt (!) 301 lb (136.5 kg)   SpO2 99%   BMI 37.62 kg/m   BP Readings from Last 3 Encounters:  03/09/18 126/84  11/17/17 126/84  08/18/17 136/84    Wt Readings from Last 3 Encounters:  03/09/18 (!) 301 lb (136.5 kg)  11/17/17 (!) 305 lb (138.3 kg)  08/18/17 (!) 304 lb (137.9 kg)    Physical Exam Constitutional:      General: He is not in acute distress.    Appearance: He is well-developed.     Comments: NAD  Eyes:     Conjunctiva/sclera: Conjunctivae normal.     Pupils: Pupils are equal, round, and reactive to light.  Neck:     Musculoskeletal: Normal range of motion.     Thyroid: No thyromegaly.     Vascular: No JVD.  Cardiovascular:     Rate and Rhythm: Normal rate and regular rhythm.     Heart sounds: Normal heart sounds. No murmur. No friction rub. No gallop.   Pulmonary:     Effort: Pulmonary effort is normal. No respiratory distress.     Breath sounds: Normal breath sounds. No wheezing or rales.  Chest:     Chest wall: No tenderness.  Abdominal:     General: Bowel sounds are normal. There is no distension.  Palpations: Abdomen is soft. There is no mass.     Tenderness: There is no abdominal tenderness. There is no guarding or rebound.  Musculoskeletal: Normal range of motion.        General: No tenderness.  Lymphadenopathy:     Cervical: No cervical adenopathy.  Skin:    General: Skin is warm and dry.     Findings: No rash.  Neurological:     Mental Status: He is alert and oriented to person, place, and time.     Cranial Nerves: No cranial nerve deficit.     Motor: No abnormal muscle tone.     Coordination: Coordination normal.     Gait: Gait normal.     Deep Tendon Reflexes:  Reflexes are normal and symmetric.  Psychiatric:        Behavior: Behavior normal.        Thought Content: Thought content normal.        Judgment: Judgment normal.   obese R leg edema  Lab Results  Component Value Date   WBC 9.2 02/17/2017   HGB 14.5 02/17/2017   HCT 42.3 02/17/2017   PLT 366.0 02/17/2017   GLUCOSE 121 (H) 02/17/2017   CHOL 142 10/23/2015   TRIG 114.0 10/23/2015   HDL 52.80 10/23/2015   LDLCALC 67 10/23/2015   ALT 20 02/17/2017   AST 19 02/17/2017   NA 139 02/17/2017   K 4.1 02/17/2017   CL 105 02/17/2017   CREATININE 1.00 02/17/2017   BUN 22 02/17/2017   CO2 26 02/17/2017   TSH 1.19 02/17/2017   PSA 0.58 02/17/2017   INR 2.7 (H) 11/17/2017   HGBA1C 6.1 06/17/2010    No results found.  Assessment & Plan:   There are no diagnoses linked to this encounter.   No orders of the defined types were placed in this encounter.    Follow-up: No follow-ups on file.  [name], MD

## 2018-03-09 NOTE — Assessment & Plan Note (Signed)
B complex

## 2018-03-09 NOTE — Assessment & Plan Note (Signed)
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

## 2018-03-09 NOTE — Assessment & Plan Note (Signed)
On Xanax prn  Potential benefits of a long term benzodiazepines use as well as potential risks  and complications were explained to the patient and were aknowledged. 

## 2018-03-09 NOTE — Assessment & Plan Note (Signed)
On Levoxyl 

## 2018-05-19 MED ORDER — SILDENAFIL CITRATE 100 MG PO TABS: 100.0000 mg | ORAL_TABLET | ORAL | 1 refills | Status: DC | PRN

## 2018-06-15 ENCOUNTER — Ambulatory Visit: Payer: Medicare Other | Admitting: Internal Medicine

## 2018-07-26 ENCOUNTER — Other Ambulatory Visit: Payer: Self-pay | Admitting: Internal Medicine

## 2018-07-26 MED ORDER — VALACYCLOVIR HCL 1 G PO TABS: 1000.0000 mg | ORAL_TABLET | Freq: Three times a day (TID) | ORAL | 1 refills | Status: DC

## 2018-07-26 MED ORDER — TRIAMCINOLONE ACETONIDE 0.5 % EX CREA: 1.0000 "application " | TOPICAL_CREAM | Freq: Four times a day (QID) | CUTANEOUS | 0 refills | Status: AC

## 2018-07-28 DIAGNOSIS — B0239 Other herpes zoster eye disease: Secondary | ICD-10-CM | POA: Diagnosis not present

## 2018-08-18 ENCOUNTER — Other Ambulatory Visit: Payer: Self-pay

## 2018-10-25 ENCOUNTER — Other Ambulatory Visit: Payer: Self-pay | Admitting: Internal Medicine

## 2018-11-11 DIAGNOSIS — Z23 Encounter for immunization: Secondary | ICD-10-CM | POA: Diagnosis not present

## 2018-12-14 ENCOUNTER — Other Ambulatory Visit: Payer: Self-pay

## 2019-01-30 ENCOUNTER — Other Ambulatory Visit: Payer: Self-pay | Admitting: Internal Medicine

## 2019-03-07 MED ORDER — SILDENAFIL CITRATE 100 MG PO TABS: 100.0000 mg | ORAL_TABLET | ORAL | 1 refills | Status: DC | PRN

## 2019-03-07 NOTE — Addendum Note (Signed)
Addended by: Karren Cobble on: 03/07/2019 01:56 PM   Modules accepted: Orders

## 2019-04-25 ENCOUNTER — Other Ambulatory Visit: Payer: Self-pay

## 2019-04-25 ENCOUNTER — Ambulatory Visit (INDEPENDENT_AMBULATORY_CARE_PROVIDER_SITE_OTHER): Payer: Medicare Other | Admitting: Internal Medicine

## 2019-04-25 ENCOUNTER — Encounter: Payer: Self-pay | Admitting: Internal Medicine

## 2019-04-25 VITALS — BP 144/86 | HR 67 | Temp 98.2°F | Ht 75.0 in | Wt 305.2 lb

## 2019-04-25 DIAGNOSIS — Z7901 Long term (current) use of anticoagulants: Secondary | ICD-10-CM | POA: Diagnosis not present

## 2019-04-25 DIAGNOSIS — B079 Viral wart, unspecified: Secondary | ICD-10-CM | POA: Diagnosis not present

## 2019-04-25 DIAGNOSIS — N320 Bladder-neck obstruction: Secondary | ICD-10-CM | POA: Diagnosis not present

## 2019-04-25 DIAGNOSIS — F411 Generalized anxiety disorder: Secondary | ICD-10-CM

## 2019-04-25 DIAGNOSIS — E039 Hypothyroidism, unspecified: Secondary | ICD-10-CM

## 2019-04-25 DIAGNOSIS — Z6836 Body mass index (BMI) 36.0-36.9, adult: Secondary | ICD-10-CM

## 2019-04-25 LAB — URINALYSIS
Bilirubin Urine: NEGATIVE
Hgb urine dipstick: NEGATIVE
Ketones, ur: 15 — AB
Leukocytes,Ua: NEGATIVE
Nitrite: NEGATIVE
Specific Gravity, Urine: >=1.03 — AB (ref 1.000–1.030)
Total Protein, Urine: NEGATIVE
Urine Glucose: NEGATIVE
Urobilinogen, UA: 0.2 (ref 0.0–1.0)
pH: 5 (ref 5.0–8.0)

## 2019-04-25 LAB — HEPATIC FUNCTION PANEL
ALT: 15 U/L (ref 0–53)
AST: 14 U/L (ref 0–37)
Albumin: 4.2 g/dL (ref 3.5–5.2)
Alkaline Phosphatase: 76 U/L (ref 39–117)
Bilirubin, Direct: 0.1 mg/dL (ref 0.0–0.3)
Total Bilirubin: 0.6 mg/dL (ref 0.2–1.2)
Total Protein: 6.5 g/dL (ref 6.0–8.3)

## 2019-04-25 LAB — TSH: TSH: 1.25 u[IU]/mL (ref 0.35–4.50)

## 2019-04-25 LAB — BASIC METABOLIC PANEL
BUN: 15 mg/dL (ref 6–23)
CO2: 24 mEq/L (ref 19–32)
Calcium: 9 mg/dL (ref 8.4–10.5)
Chloride: 104 mEq/L (ref 96–112)
Creatinine, Ser: 1.02 mg/dL (ref 0.40–1.50)
GFR: 72.48 mL/min (ref >60.00)
Glucose, Bld: 94 mg/dL (ref 70–99)
Potassium: 3.9 mEq/L (ref 3.5–5.1)
Sodium: 137 mEq/L (ref 135–145)

## 2019-04-25 LAB — PROTIME-INR
INR: 3 ratio — ABNORMAL HIGH (ref 0.8–1.0)
Prothrombin Time: 32.9 s — ABNORMAL HIGH (ref 9.6–13.1)

## 2019-04-25 LAB — CBC WITH DIFFERENTIAL/PLATELET
Basophils Absolute: 0.1 10*3/uL (ref 0.0–0.1)
Basophils Relative: 0.5 % (ref 0.0–3.0)
Eosinophils Absolute: 0 10*3/uL (ref 0.0–0.7)
Eosinophils Relative: 0.5 % (ref 0.0–5.0)
HCT: 42 % (ref 39.0–52.0)
Hemoglobin: 14.6 g/dL (ref 13.0–17.0)
Lymphocytes Relative: 15.5 % (ref 12.0–46.0)
Lymphs Abs: 1.5 10*3/uL (ref 0.7–4.0)
MCHC: 34.7 g/dL (ref 30.0–36.0)
MCV: 97.8 fl (ref 78.0–100.0)
Monocytes Absolute: 0.7 10*3/uL (ref 0.1–1.0)
Monocytes Relative: 7.1 % (ref 3.0–12.0)
Neutro Abs: 7.5 10*3/uL (ref 1.4–7.7)
Neutrophils Relative %: 76.4 % (ref 43.0–77.0)
Platelets: 365 10*3/uL (ref 150.0–400.0)
RBC: 4.29 Mil/uL (ref 4.22–5.81)
RDW: 12.6 % (ref 11.5–15.5)
WBC: 9.9 10*3/uL (ref 4.0–10.5)

## 2019-04-25 LAB — LIPID PANEL
Cholesterol: 141 mg/dL (ref 0–200)
HDL: 43.9 mg/dL (ref >39.00)
LDL Cholesterol: 71 mg/dL (ref 0–99)
NonHDL: 96.7
Total CHOL/HDL Ratio: 3
Triglycerides: 131 mg/dL (ref 0.0–149.0)
VLDL: 26.2 mg/dL (ref 0.0–40.0)

## 2019-04-25 LAB — PSA: PSA: 0.48 ng/mL (ref 0.10–4.00)

## 2019-04-25 MED ORDER — ALPRAZOLAM 1 MG PO TABS: ORAL_TABLET | ORAL | 1 refills | Status: DC

## 2019-04-25 MED ORDER — WARFARIN SODIUM 10 MG PO TABS: ORAL_TABLET | ORAL | 1 refills | Status: DC

## 2019-04-25 MED ORDER — LEVOTHYROXINE SODIUM 25 MCG PO TABS: 25.0000 ug | ORAL_TABLET | Freq: Every day | ORAL | 3 refills | Status: DC

## 2019-04-25 NOTE — Assessment & Plan Note (Signed)
Levoxyl 

## 2019-04-25 NOTE — Progress Notes (Signed)
Subjective:  Patient ID: Joseph [name], male    DOB: [birth year]  Age: 69 y.o. MRN: [id number]  CC: No chief complaint on file.   HPI Joseph [name] presents for anxiety, hypothyroidism, anticoagulation f/u C/o R knee pain C/o a wart on R knee    Outpatient Medications Prior to Visit  Medication Sig Dispense Refill  . ALPRAZolam (XANAX) 1 MG tablet TAKE ONE-HALF TO ONE TABLET BY MOUTH TWO TIMES A DAY AS NEEDED FOR ANXIETY 180 tablet 1  . Cholecalciferol 1000 UNITS tablet Take 1,000 Units by mouth daily. Reported on 04/24/2015    . levothyroxine (SYNTHROID) 25 MCG tablet TAKE ONE TABLET BY MOUTH DAILY 90 tablet 0  . sildenafil (VIAGRA) 100 MG tablet Take 1 tablet (100 mg total) by mouth as needed for erectile dysfunction. 100 tablet 1  . triamcinolone cream (KENALOG) 0.5 % Apply 1 application topically 4 (four) times daily. 60 g 0  . valACYclovir (VALTREX) 1000 MG tablet Take 1 tablet (1,000 mg total) by mouth 3 (three) times daily. 21 tablet 1  . warfarin (COUMADIN) 10 MG tablet TAKE 1 TABLET BY MOUTH AS DIRECTED, THEN 1&1/2 TABLET ON MONDAY AND FRIDAY AND 1 TABLET ON ALL OTHER DAYS. 120 tablet 3  . warfarin (COUMADIN) 10 MG tablet TAKE 1 AND ONE-HALF TABLET BY MOUTH ON MONDAYS AND FRIDAYS AND TAKE 1 TABLET BY MOUTH DAILY ON ALL OTHER DAYS 120 tablet 0   No facility-administered medications prior to visit.    ROS: Review of Systems  Constitutional: Negative for appetite change, fatigue and unexpected weight change.  HENT: Negative for congestion, nosebleeds, sneezing, sore throat and trouble swallowing.   Eyes: Negative for itching and visual disturbance.  Respiratory: Negative for cough.   Cardiovascular: Negative for chest pain, palpitations and leg swelling.  Gastrointestinal: Negative for abdominal distention, blood in stool, diarrhea and nausea.  Genitourinary: Negative for frequency and hematuria.  Musculoskeletal: Positive for arthralgias and gait problem. Negative for  back pain, joint swelling and neck pain.  Skin: Negative for rash.  Neurological: Negative for dizziness, tremors, speech difficulty and weakness.  Psychiatric/Behavioral: Negative for agitation, dysphoric mood, sleep disturbance and suicidal ideas. The patient is nervous/anxious.     Objective:  There were no vitals taken for this visit.  BP Readings from Last 3 Encounters:  03/09/18 126/84  11/17/17 126/84  08/18/17 136/84    Wt Readings from Last 3 Encounters:  03/09/18 (!) 301 lb (136.5 kg)  11/17/17 (!) 305 lb (138.3 kg)  08/18/17 (!) 304 lb (137.9 kg)    Physical Exam Constitutional:      General: He is not in acute distress.    Appearance: He is well-developed.     Comments: NAD  Eyes:     Conjunctiva/sclera: Conjunctivae normal.     Pupils: Pupils are equal, round, and reactive to light.  Neck:     Thyroid: No thyromegaly.     Vascular: No JVD.  Cardiovascular:     Rate and Rhythm: Normal rate and regular rhythm.     Heart sounds: Normal heart sounds. No murmur. No friction rub. No gallop.   Pulmonary:     Effort: Pulmonary effort is normal. No respiratory distress.     Breath sounds: Normal breath sounds. No wheezing or rales.  Chest:     Chest wall: No tenderness.  Abdominal:     General: Bowel sounds are normal. There is no distension.     Palpations: Abdomen is soft. There is no  mass.     Tenderness: There is no abdominal tenderness. There is no guarding or rebound.  Musculoskeletal:        General: Tenderness present. Normal range of motion.     Cervical back: Normal range of motion.  Lymphadenopathy:     Cervical: No cervical adenopathy.  Skin:    General: Skin is warm and dry.     Findings: No rash.  Neurological:     Mental Status: He is alert and oriented to person, place, and time.     Cranial Nerves: No cranial nerve deficit.     Motor: No abnormal muscle tone.     Coordination: Coordination normal.     Gait: Gait normal.     Deep Tendon  Reflexes: Reflexes are normal and symmetric.  Psychiatric:        Behavior: Behavior normal.        Thought Content: Thought content normal.        Judgment: Judgment normal.   R knee is painful w/ROM L knee 6 mm ?wart   Procedure Note :     Procedure : Cryosurgery   Indication:  Wart(s)     Risks including unsuccessful procedure , bleeding, infection, bruising, scar, a need for a repeat  procedure and others were explained to the patient in detail as well as the benefits. Informed consent was obtained verbally.   1  lesion(s)  on L knee   was/were treated with liquid nitrogen on a Q-tip in a usual fasion . Band-Aid was applied and antibiotic ointment was given for a later use.   Tolerated well. Complications none.   Postprocedure instructions :     Keep the wounds clean. You can wash them with liquid soap and water. Pat dry with gauze or a Kleenex tissue  Before applying antibiotic ointment and a Band-Aid.   You need to report immediately  if  any signs of infection develop.     Lab Results  Component Value Date   WBC 9.2 02/17/2017   HGB 14.5 02/17/2017   HCT 42.3 02/17/2017   PLT 366.0 02/17/2017   GLUCOSE 121 (H) 02/17/2017   CHOL 142 10/23/2015   TRIG 114.0 10/23/2015   HDL 52.80 10/23/2015   LDLCALC 67 10/23/2015   ALT 20 02/17/2017   AST 19 02/17/2017   NA 139 02/17/2017   K 4.1 02/17/2017   CL 105 02/17/2017   CREATININE 1.00 02/17/2017   BUN 22 02/17/2017   CO2 26 02/17/2017   TSH 1.19 02/17/2017   PSA 0.58 02/17/2017   INR 2.4 (H) 03/09/2018   HGBA1C 6.1 06/17/2010    No results found.  Assessment & Plan:    Joseph Kehr, MD

## 2019-04-25 NOTE — Assessment & Plan Note (Signed)
L knee 6 mm ?wart Cryo

## 2019-04-25 NOTE — Assessment & Plan Note (Signed)
Chronic On Xanax prn  Potential benefits of a long term benzodiazepines  use as well as potential risks  and complications were explained to the patient and were aknowledged.

## 2019-07-26 ENCOUNTER — Ambulatory Visit: Payer: Medicare Other | Admitting: Internal Medicine

## 2019-08-02 ENCOUNTER — Ambulatory Visit (INDEPENDENT_AMBULATORY_CARE_PROVIDER_SITE_OTHER): Payer: Medicare Other | Admitting: Internal Medicine

## 2019-08-02 ENCOUNTER — Encounter: Payer: Self-pay | Admitting: Internal Medicine

## 2019-08-02 ENCOUNTER — Other Ambulatory Visit: Payer: Self-pay

## 2019-08-02 VITALS — BP 142/88 | HR 83 | Temp 98.2°F | Ht 75.0 in | Wt 305.0 lb

## 2019-08-02 DIAGNOSIS — Z6836 Body mass index (BMI) 36.0-36.9, adult: Secondary | ICD-10-CM

## 2019-08-02 DIAGNOSIS — E039 Hypothyroidism, unspecified: Secondary | ICD-10-CM | POA: Diagnosis not present

## 2019-08-02 DIAGNOSIS — D485 Neoplasm of uncertain behavior of skin: Secondary | ICD-10-CM | POA: Insufficient documentation

## 2019-08-02 DIAGNOSIS — Z86718 Personal history of other venous thrombosis and embolism: Secondary | ICD-10-CM | POA: Diagnosis not present

## 2019-08-02 DIAGNOSIS — B079 Viral wart, unspecified: Secondary | ICD-10-CM

## 2019-08-02 DIAGNOSIS — Z7901 Long term (current) use of anticoagulants: Secondary | ICD-10-CM | POA: Diagnosis not present

## 2019-08-02 DIAGNOSIS — L600 Ingrowing nail: Secondary | ICD-10-CM | POA: Diagnosis not present

## 2019-08-02 MED ORDER — MUPIROCIN 2 % EX OINT: TOPICAL_OINTMENT | CUTANEOUS | 0 refills | Status: DC

## 2019-08-02 MED ORDER — DOXYCYCLINE HYCLATE 100 MG PO TABS: 100.0000 mg | ORAL_TABLET | Freq: Two times a day (BID) | ORAL | 1 refills | Status: DC

## 2019-08-02 NOTE — Progress Notes (Signed)
Subjective:  Patient ID: Joseph [name], male    DOB: 1951-01-18  Age: 69 y.o. MRN: [id number]  CC: No chief complaint on file.   HPI Joseph [name] presents for annxiety, DVT, hypothyroidism f/u C/o scab on R upper lip C/o R toenail pain/discoloration #2-3 toes  Outpatient Medications Prior to Visit  Medication Sig Dispense Refill  . ALPRAZolam (XANAX) 1 MG tablet TAKE ONE-HALF TO ONE TABLET BY MOUTH TWO TIMES A DAY AS NEEDED FOR ANXIETY 180 tablet 1  . Cholecalciferol 1000 UNITS tablet Take 1,000 Units by mouth daily. Reported on 04/24/2015    . levothyroxine (SYNTHROID) 25 MCG tablet Take 1 tablet (25 mcg total) by mouth daily. 90 tablet 3  . sildenafil (VIAGRA) 100 MG tablet Take 1 tablet (100 mg total) by mouth as needed for erectile dysfunction. 100 tablet 1  . warfarin (COUMADIN) 10 MG tablet TAKE 1 AND ONE-HALF TABLET BY MOUTH ON MONDAYS AND FRIDAYS AND TAKE 1 TABLET BY MOUTH DAILY ON ALL OTHER DAYS 120 tablet 1  . valACYclovir (VALTREX) 1000 MG tablet Take 1 tablet (1,000 mg total) by mouth 3 (three) times daily. (Patient not taking: Reported on 08/02/2019) 21 tablet 1   No facility-administered medications prior to visit.    ROS: Review of Systems  Constitutional: Positive for unexpected weight change. Negative for appetite change and fatigue.  HENT: Negative for congestion, nosebleeds, sneezing, sore throat and trouble swallowing.   Eyes: Negative for itching and visual disturbance.  Respiratory: Negative for cough.   Cardiovascular: Negative for chest pain, palpitations and leg swelling.  Gastrointestinal: Negative for abdominal distention, blood in stool, diarrhea and nausea.  Genitourinary: Negative for frequency and hematuria.  Musculoskeletal: Negative for back pain, gait problem, joint swelling and neck pain.  Skin: Positive for wound. Negative for rash.  Neurological: Negative for dizziness, tremors, speech difficulty and weakness.  Hematological: Bruises/bleeds  easily.  Psychiatric/Behavioral: Negative for agitation, dysphoric mood and sleep disturbance. The patient is not nervous/anxious.     Objective:  BP (!) 142/88 (BP Location: Right Arm, Patient Position: Sitting, Cuff Size: Large)   Pulse 83   Temp 98.2 F (36.8 C) (Oral)   Ht 6\' 3"  (1.905 m)   Wt (!) 305 lb (138.3 kg)   SpO2 97%   BMI 38.12 kg/m   BP Readings from Last 3 Encounters:  08/02/19 (!) 142/88  04/25/19 (!) 144/86  03/09/18 126/84    Wt Readings from Last 3 Encounters:  08/02/19 (!) 305 lb (138.3 kg)  04/25/19 (!) 305 lb 4 oz (138.5 kg)  03/09/18 (!) 301 lb (136.5 kg)    Physical Exam Constitutional:      General: He is not in acute distress.    Appearance: He is well-developed. He is obese.     Comments: NAD  Eyes:     Conjunctiva/sclera: Conjunctivae normal.     Pupils: Pupils are equal, round, and reactive to light.  Neck:     Thyroid: No thyromegaly.     Vascular: No JVD.  Cardiovascular:     Rate and Rhythm: Normal rate and regular rhythm.     Heart sounds: Normal heart sounds. No murmur heard.  No friction rub. No gallop.   Pulmonary:     Effort: Pulmonary effort is normal. No respiratory distress.     Breath sounds: Normal breath sounds. No wheezing or rales.  Chest:     Chest wall: No tenderness.  Abdominal:     General: Bowel sounds are normal.  There is no distension.     Palpations: Abdomen is soft. There is no mass.     Tenderness: There is no abdominal tenderness. There is no guarding or rebound.  Musculoskeletal:        General: No tenderness. Normal range of motion.     Cervical back: Normal range of motion.     Right lower leg: Edema present.     Left lower leg: Edema present.  Lymphadenopathy:     Cervical: No cervical adenopathy.  Skin:    General: Skin is warm and dry.     Findings: Bruising and lesion present. No rash.  Neurological:     Mental Status: He is alert and oriented to person, place, and time.     Cranial Nerves:  No cranial nerve deficit.     Motor: No abnormal muscle tone.     Coordination: Coordination normal.     Gait: Gait normal.     Deep Tendon Reflexes: Reflexes are normal and symmetric.  Psychiatric:        Mood and Affect: Mood normal.        Behavior: Behavior normal.        Thought Content: Thought content normal.        Judgment: Judgment normal.     R toenail purple discoloration #2-3 toes, NT Ingrown and thick toenails - trimmed Wart L knee - better   Procedure Note :     Procedure : Cryosurgery   Indication:  Wart(s)    Risks including unsuccessful procedure , bleeding, infection, bruising, scar, a need for a repeat  procedure and others were explained to the patient in detail as well as the benefits. Informed consent was obtained verbally.    1 lesion(s)  on  L knee  was/were treated with liquid nitrogen on a Q-tip in a usual fasion . Band-Aid was applied and antibiotic ointment was given for a later use.   Tolerated well. Complications none.   Postprocedure instructions :     Keep the wounds clean. You can wash them with liquid soap and water. Pat dry with gauze or a Kleenex tissue  Before applying antibiotic ointment and a Band-Aid.   You need to report immediately  if  any signs of infection develop.     Lab Results  Component Value Date   WBC 9.9 04/25/2019   HGB 14.6 04/25/2019   HCT 42.0 04/25/2019   PLT 365.0 04/25/2019   GLUCOSE 94 04/25/2019   CHOL 141 04/25/2019   TRIG 131.0 04/25/2019   HDL 43.90 04/25/2019   LDLCALC 71 04/25/2019   ALT 15 04/25/2019   AST 14 04/25/2019   NA 137 04/25/2019   K 3.9 04/25/2019   CL 104 04/25/2019   CREATININE 1.02 04/25/2019   BUN 15 04/25/2019   CO2 24 04/25/2019   TSH 1.25 04/25/2019   PSA 0.48 04/25/2019   INR 3.0 (H) 04/25/2019   HGBA1C 6.1 06/17/2010    No results found.  Assessment & Plan:    [name], MD

## 2019-08-02 NOTE — Assessment & Plan Note (Signed)
Upper lip skin ?pustule vs other Empiric Doxy and bactroban Rx Skin bx if not gone

## 2019-08-02 NOTE — Assessment & Plan Note (Signed)
Re-treated once more

## 2019-08-02 NOTE — Assessment & Plan Note (Signed)
Multiple and bilateral Trimmed nails Advised podiatry consult

## 2019-08-02 NOTE — Patient Instructions (Addendum)
Sign up for [employer] ( via Libby app on your phone or your ipad). If you don't have a library card  - go to any library branch. They will set you up in 15 minutes. It is free.    

## 2019-08-02 NOTE — Assessment & Plan Note (Signed)
Wt Readings from Last 3 Encounters:  08/02/19 (!) 305 lb (138.3 kg)  04/25/19 (!) 305 lb 4 oz (138.5 kg)  03/09/18 (!) 301 lb (136.5 kg)

## 2019-08-03 LAB — BASIC METABOLIC PANEL
BUN: 20 mg/dL (ref 7–25)
CO2: 26 mmol/L (ref 20–32)
Calcium: 8.9 mg/dL (ref 8.6–10.3)
Chloride: 106 mmol/L (ref 98–110)
Creat: 1.17 mg/dL (ref 0.70–1.25)
Glucose, Bld: 97 mg/dL (ref 65–99)
Potassium: 4.4 mmol/L (ref 3.5–5.3)
Sodium: 139 mmol/L (ref 135–146)

## 2019-08-03 LAB — PROTIME-INR
INR: 3.6 — ABNORMAL HIGH
Prothrombin Time: 36.7 s — ABNORMAL HIGH (ref 9.0–11.5)

## 2019-08-04 NOTE — Telephone Encounter (Signed)
Entered Covid vaccination dates.Joseph [name] msg to coumadin clinic nurse. Pls advise on email.Marland KitchenJohny [name]

## 2019-08-07 NOTE — Telephone Encounter (Signed)
Per Jenny Reichmann she states Dr. Camila Li handles pt coumadin. Pls advise on email for coumadin dosage.Marland KitchenJohny Chess

## 2019-10-26 DIAGNOSIS — Z23 Encounter for immunization: Secondary | ICD-10-CM | POA: Diagnosis not present

## 2019-11-08 ENCOUNTER — Ambulatory Visit (INDEPENDENT_AMBULATORY_CARE_PROVIDER_SITE_OTHER): Payer: Medicare Other

## 2019-11-08 ENCOUNTER — Encounter: Payer: Self-pay | Admitting: Internal Medicine

## 2019-11-08 ENCOUNTER — Ambulatory Visit (INDEPENDENT_AMBULATORY_CARE_PROVIDER_SITE_OTHER): Payer: Medicare Other | Admitting: Internal Medicine

## 2019-11-08 ENCOUNTER — Other Ambulatory Visit: Payer: Self-pay

## 2019-11-08 DIAGNOSIS — G8929 Other chronic pain: Secondary | ICD-10-CM

## 2019-11-08 DIAGNOSIS — M1611 Unilateral primary osteoarthritis, right hip: Secondary | ICD-10-CM | POA: Diagnosis not present

## 2019-11-08 DIAGNOSIS — M25551 Pain in right hip: Secondary | ICD-10-CM

## 2019-11-08 DIAGNOSIS — M25562 Pain in left knee: Secondary | ICD-10-CM | POA: Diagnosis not present

## 2019-11-08 DIAGNOSIS — M5441 Lumbago with sciatica, right side: Secondary | ICD-10-CM | POA: Diagnosis not present

## 2019-11-08 DIAGNOSIS — M25561 Pain in right knee: Secondary | ICD-10-CM

## 2019-11-08 DIAGNOSIS — M545 Low back pain, unspecified: Secondary | ICD-10-CM | POA: Insufficient documentation

## 2019-11-08 DIAGNOSIS — F411 Generalized anxiety disorder: Secondary | ICD-10-CM

## 2019-11-08 DIAGNOSIS — Z7901 Long term (current) use of anticoagulants: Secondary | ICD-10-CM | POA: Diagnosis not present

## 2019-11-08 DIAGNOSIS — M5442 Lumbago with sciatica, left side: Secondary | ICD-10-CM

## 2019-11-08 LAB — PROTIME-INR
INR: 3 ratio — ABNORMAL HIGH (ref 0.8–1.0)
Prothrombin Time: 33.4 s — ABNORMAL HIGH (ref 9.6–13.1)

## 2019-11-08 MED ORDER — SILDENAFIL CITRATE 100 MG PO TABS
100.0000 mg | ORAL_TABLET | ORAL | 1 refills | Status: DC | PRN
Start: 2019-11-08 — End: 2020-05-06

## 2019-11-08 MED ORDER — PREDNISONE 10 MG PO TABS: ORAL_TABLET | ORAL | 1 refills | Status: DC

## 2019-11-08 MED ORDER — ALPRAZOLAM 1 MG PO TABS
ORAL_TABLET | ORAL | 1 refills | Status: DC
Start: 2019-11-08 — End: 2020-05-06

## 2019-11-08 NOTE — Assessment & Plan Note (Addendum)
Hip joint pain vs sciatica - R LS x ray R hip X ray

## 2019-11-08 NOTE — Progress Notes (Signed)
Subjective:  Patient ID: Joseph [name], male    DOB: [birth year]  Age: 69 y.o. MRN: [id number]  CC: Follow-up   HPI MARCELUS DUBBERLY presents for LBP, hip pain L and R, B knee pain  Pt lost 19 lbs on a low carb diet F/u DVT, hypothyroidism f/u  Outpatient Medications Prior to Visit  Medication Sig Dispense Refill  . ALPRAZolam (XANAX) 1 MG tablet TAKE ONE-HALF TO ONE TABLET BY MOUTH TWO TIMES A DAY AS NEEDED FOR ANXIETY 180 tablet 1  . Cholecalciferol 1000 UNITS tablet Take 1,000 Units by mouth daily. Reported on 04/24/2015    . doxycycline (VIBRA-TABS) 100 MG tablet Take 1 tablet (100 mg total) by mouth 2 (two) times daily. 14 tablet 1  . levothyroxine (SYNTHROID) 25 MCG tablet Take 1 tablet (25 mcg total) by mouth daily. 90 tablet 3  . mupirocin ointment (BACTROBAN) 2 % On lesion qid 15 g 0  . sildenafil (VIAGRA) 100 MG tablet Take 1 tablet (100 mg total) by mouth as needed for erectile dysfunction. 100 tablet 1  . warfarin (COUMADIN) 10 MG tablet TAKE 1 AND ONE-HALF TABLET BY MOUTH ON MONDAYS AND FRIDAYS AND TAKE 1 TABLET BY MOUTH DAILY ON ALL OTHER DAYS 120 tablet 1   No facility-administered medications prior to visit.    ROS: Review of Systems  Constitutional: Negative for appetite change, fatigue and unexpected weight change.  HENT: Negative for congestion, nosebleeds, sneezing, sore throat and trouble swallowing.   Eyes: Negative for itching and visual disturbance.  Respiratory: Negative for cough.   Cardiovascular: Negative for chest pain, palpitations and leg swelling.  Gastrointestinal: Negative for abdominal distention, blood in stool, diarrhea and nausea.  Genitourinary: Negative for frequency and hematuria.  Musculoskeletal: Positive for arthralgias, back pain and gait problem. Negative for joint swelling and neck pain.  Skin: Negative for rash.  Neurological: Negative for dizziness, tremors, speech difficulty and weakness.  Psychiatric/Behavioral: Negative for  agitation, dysphoric mood, sleep disturbance and suicidal ideas. The patient is not nervous/anxious.     Objective:  BP 140/82 (BP Location: Left Arm, Patient Position: Sitting, Cuff Size: Large)   Pulse 80   Temp 98.3 F (36.8 C) (Oral)   Ht 6\' 3"  (1.905 m)   Wt 286 lb 9.6 oz (130 kg)   SpO2 96%   BMI 35.82 kg/m   BP Readings from Last 3 Encounters:  11/08/19 140/82  08/02/19 (!) 142/88  04/25/19 (!) 144/86    Wt Readings from Last 3 Encounters:  11/08/19 286 lb 9.6 oz (130 kg)  08/02/19 (!) 305 lb (138.3 kg)  04/25/19 (!) 305 lb 4 oz (138.5 kg)    Physical Exam Constitutional:      General: He is not in acute distress.    Appearance: He is well-developed.     Comments: NAD  Eyes:     Conjunctiva/sclera: Conjunctivae normal.     Pupils: Pupils are equal, round, and reactive to light.  Neck:     Thyroid: No thyromegaly.     Vascular: No JVD.  Cardiovascular:     Rate and Rhythm: Normal rate and regular rhythm.     Heart sounds: Normal heart sounds. No murmur heard.  No friction rub. No gallop.   Pulmonary:     Effort: Pulmonary effort is normal. No respiratory distress.     Breath sounds: Normal breath sounds. No wheezing or rales.  Chest:     Chest wall: No tenderness.  Abdominal:  General: Bowel sounds are normal. There is no distension.     Palpations: Abdomen is soft. There is no mass.     Tenderness: There is no abdominal tenderness. There is no guarding or rebound.  Musculoskeletal:        General: No tenderness. Normal range of motion.     Cervical back: Normal range of motion.  Lymphadenopathy:     Cervical: No cervical adenopathy.  Skin:    General: Skin is warm and dry.     Findings: No rash.  Neurological:     Mental Status: He is alert and oriented to person, place, and time.     Cranial Nerves: No cranial nerve deficit.     Motor: No abnormal muscle tone.     Coordination: Coordination normal.     Gait: Gait normal.     Deep Tendon  Reflexes: Reflexes are normal and symmetric.  Psychiatric:        Behavior: Behavior normal.        Thought Content: Thought content normal.        Judgment: Judgment normal.     Lab Results  Component Value Date   WBC 9.9 04/25/2019   HGB 14.6 04/25/2019   HCT 42.0 04/25/2019   PLT 365.0 04/25/2019   GLUCOSE 97 08/02/2019   CHOL 141 04/25/2019   TRIG 131.0 04/25/2019   HDL 43.90 04/25/2019   LDLCALC 71 04/25/2019   ALT 15 04/25/2019   AST 14 04/25/2019   NA 139 08/02/2019   K 4.4 08/02/2019   CL 106 08/02/2019   CREATININE 1.17 08/02/2019   BUN 20 08/02/2019   CO2 26 08/02/2019   TSH 1.25 04/25/2019   PSA 0.48 04/25/2019   INR 3.6 (H) 08/02/2019   HGBA1C 6.1 06/17/2010    No results found.  Assessment & Plan:    [name], MD

## 2019-11-08 NOTE — Patient Instructions (Addendum)
Trekking poles Chair yoga

## 2019-11-08 NOTE — Assessment & Plan Note (Addendum)
probable spinal stenosis B LS x ray; consider MRI Trekking poles

## 2019-11-13 NOTE — Assessment & Plan Note (Signed)
B knee pain See Ortho

## 2019-11-13 NOTE — Assessment & Plan Note (Signed)
On Xanax prn  Potential benefits of a long term benzodiazepines use as well as potential risks  and complications were explained to the patient and were aknowledged. 

## 2019-12-21 DIAGNOSIS — M25559 Pain in unspecified hip: Secondary | ICD-10-CM | POA: Diagnosis not present

## 2019-12-21 DIAGNOSIS — M5416 Radiculopathy, lumbar region: Secondary | ICD-10-CM | POA: Diagnosis not present

## 2019-12-26 DIAGNOSIS — M5416 Radiculopathy, lumbar region: Secondary | ICD-10-CM | POA: Diagnosis not present

## 2019-12-26 DIAGNOSIS — M545 Low back pain, unspecified: Secondary | ICD-10-CM | POA: Diagnosis not present

## 2019-12-26 DIAGNOSIS — G5701 Lesion of sciatic nerve, right lower limb: Secondary | ICD-10-CM | POA: Diagnosis not present

## 2020-01-02 DIAGNOSIS — Z1211 Encounter for screening for malignant neoplasm of colon: Secondary | ICD-10-CM | POA: Diagnosis not present

## 2020-01-02 DIAGNOSIS — Z86010 Personal history of colonic polyps: Secondary | ICD-10-CM | POA: Diagnosis not present

## 2020-01-02 DIAGNOSIS — Z9189 Other specified personal risk factors, not elsewhere classified: Secondary | ICD-10-CM | POA: Diagnosis not present

## 2020-01-05 DIAGNOSIS — M5442 Lumbago with sciatica, left side: Secondary | ICD-10-CM | POA: Diagnosis not present

## 2020-01-05 DIAGNOSIS — M5441 Lumbago with sciatica, right side: Secondary | ICD-10-CM | POA: Diagnosis not present

## 2020-01-09 DIAGNOSIS — M545 Low back pain, unspecified: Secondary | ICD-10-CM | POA: Diagnosis not present

## 2020-01-09 DIAGNOSIS — M25551 Pain in right hip: Secondary | ICD-10-CM | POA: Diagnosis not present

## 2020-01-16 DIAGNOSIS — M545 Low back pain, unspecified: Secondary | ICD-10-CM | POA: Diagnosis not present

## 2020-01-27 ENCOUNTER — Other Ambulatory Visit: Payer: Self-pay | Admitting: Internal Medicine

## 2020-01-29 ENCOUNTER — Other Ambulatory Visit: Payer: Self-pay | Admitting: General Practice

## 2020-01-29 DIAGNOSIS — Z7901 Long term (current) use of anticoagulants: Secondary | ICD-10-CM

## 2020-01-29 MED ORDER — WARFARIN SODIUM 10 MG PO TABS: ORAL_TABLET | ORAL | 0 refills | Status: DC

## 2020-01-29 NOTE — Telephone Encounter (Signed)
forwarding msg to cindy coumadin clinic nurse.Marland KitchenJohny [name]

## 2020-02-05 ENCOUNTER — Ambulatory Visit: Payer: Medicare Other | Admitting: Internal Medicine

## 2020-02-06 ENCOUNTER — Telehealth: Payer: Self-pay | Admitting: Internal Medicine

## 2020-02-06 ENCOUNTER — Other Ambulatory Visit: Payer: Self-pay

## 2020-02-06 DIAGNOSIS — Z7901 Long term (current) use of anticoagulants: Secondary | ICD-10-CM

## 2020-02-06 DIAGNOSIS — N320 Bladder-neck obstruction: Secondary | ICD-10-CM

## 2020-02-06 DIAGNOSIS — E291 Testicular hypofunction: Secondary | ICD-10-CM

## 2020-02-06 DIAGNOSIS — E039 Hypothyroidism, unspecified: Secondary | ICD-10-CM

## 2020-02-06 NOTE — Telephone Encounter (Signed)
Patient is coming in for labs at 2:30pm tomorrow before his 3:20pm appointment.  Patient needs lab orders entered

## 2020-02-07 ENCOUNTER — Encounter: Payer: Self-pay | Admitting: Internal Medicine

## 2020-02-07 ENCOUNTER — Other Ambulatory Visit: Payer: Self-pay

## 2020-02-07 ENCOUNTER — Ambulatory Visit (INDEPENDENT_AMBULATORY_CARE_PROVIDER_SITE_OTHER): Payer: Medicare Other | Admitting: Internal Medicine

## 2020-02-07 ENCOUNTER — Other Ambulatory Visit (INDEPENDENT_AMBULATORY_CARE_PROVIDER_SITE_OTHER): Payer: Medicare Other

## 2020-02-07 DIAGNOSIS — E291 Testicular hypofunction: Secondary | ICD-10-CM

## 2020-02-07 DIAGNOSIS — E039 Hypothyroidism, unspecified: Secondary | ICD-10-CM

## 2020-02-07 DIAGNOSIS — Z6836 Body mass index (BMI) 36.0-36.9, adult: Secondary | ICD-10-CM

## 2020-02-07 DIAGNOSIS — N320 Bladder-neck obstruction: Secondary | ICD-10-CM

## 2020-02-07 DIAGNOSIS — F411 Generalized anxiety disorder: Secondary | ICD-10-CM

## 2020-02-07 DIAGNOSIS — Z7901 Long term (current) use of anticoagulants: Secondary | ICD-10-CM | POA: Diagnosis not present

## 2020-02-07 DIAGNOSIS — M25551 Pain in right hip: Secondary | ICD-10-CM

## 2020-02-07 DIAGNOSIS — Z86718 Personal history of other venous thrombosis and embolism: Secondary | ICD-10-CM

## 2020-02-07 DIAGNOSIS — M5441 Lumbago with sciatica, right side: Secondary | ICD-10-CM

## 2020-02-07 DIAGNOSIS — G8929 Other chronic pain: Secondary | ICD-10-CM

## 2020-02-07 DIAGNOSIS — R413 Other amnesia: Secondary | ICD-10-CM

## 2020-02-07 DIAGNOSIS — M5442 Lumbago with sciatica, left side: Secondary | ICD-10-CM | POA: Diagnosis not present

## 2020-02-07 LAB — COMPREHENSIVE METABOLIC PANEL
ALT: 17 U/L (ref 0–53)
AST: 15 U/L (ref 0–37)
Albumin: 4.1 g/dL (ref 3.5–5.2)
Alkaline Phosphatase: 65 U/L (ref 39–117)
BUN: 19 mg/dL (ref 6–23)
CO2: 28 mEq/L (ref 19–32)
Calcium: 8.9 mg/dL (ref 8.4–10.5)
Chloride: 104 mEq/L (ref 96–112)
Creatinine, Ser: 1.03 mg/dL (ref 0.40–1.50)
GFR: 74.12 mL/min (ref >60.00)
Glucose, Bld: 96 mg/dL (ref 70–99)
Potassium: 4.4 mEq/L (ref 3.5–5.1)
Sodium: 138 mEq/L (ref 135–145)
Total Bilirubin: 0.5 mg/dL (ref 0.2–1.2)
Total Protein: 6.3 g/dL (ref 6.0–8.3)

## 2020-02-07 LAB — CBC WITH DIFFERENTIAL/PLATELET
Basophils Absolute: 0 10*3/uL (ref 0.0–0.1)
Basophils Relative: 0.4 % (ref 0.0–3.0)
Eosinophils Absolute: 0.1 10*3/uL (ref 0.0–0.7)
Eosinophils Relative: 1.2 % (ref 0.0–5.0)
HCT: 41.9 % (ref 39.0–52.0)
Hemoglobin: 14.2 g/dL (ref 13.0–17.0)
Lymphocytes Relative: 19.4 % (ref 12.0–46.0)
Lymphs Abs: 2 10*3/uL (ref 0.7–4.0)
MCHC: 34 g/dL (ref 30.0–36.0)
MCV: 96.8 fl (ref 78.0–100.0)
Monocytes Absolute: 0.8 10*3/uL (ref 0.1–1.0)
Monocytes Relative: 7.4 % (ref 3.0–12.0)
Neutro Abs: 7.4 10*3/uL (ref 1.4–7.7)
Neutrophils Relative %: 71.6 % (ref 43.0–77.0)
Platelets: 379 10*3/uL (ref 150.0–400.0)
RBC: 4.33 Mil/uL (ref 4.22–5.81)
RDW: 13.4 % (ref 11.5–15.5)
WBC: 10.3 10*3/uL (ref 4.0–10.5)

## 2020-02-07 LAB — PROTIME-INR
INR: 2.7 ratio — ABNORMAL HIGH (ref 0.8–1.0)
Prothrombin Time: 30.4 s — ABNORMAL HIGH (ref 9.6–13.1)

## 2020-02-07 LAB — TSH: TSH: 0.96 u[IU]/mL (ref 0.35–4.50)

## 2020-02-07 LAB — LIPID PANEL
Cholesterol: 142 mg/dL (ref 0–200)
HDL: 59.3 mg/dL (ref >39.00)
LDL Cholesterol: 64 mg/dL (ref 0–99)
NonHDL: 83.05
Total CHOL/HDL Ratio: 2
Triglycerides: 96 mg/dL (ref 0.0–149.0)
VLDL: 19.2 mg/dL (ref 0.0–40.0)

## 2020-02-07 LAB — PSA: PSA: 0.47 ng/mL (ref 0.10–4.00)

## 2020-02-07 MED ORDER — VITAMIN D3 50 MCG (2000 UT) PO CAPS: 2000.0000 [IU] | ORAL_CAPSULE | Freq: Every day | ORAL | 3 refills | Status: AC

## 2020-02-07 NOTE — Assessment & Plan Note (Signed)
INR done

## 2020-02-07 NOTE — Progress Notes (Signed)
Subjective:  Patient ID: Joseph [name], male    DOB: [birth year]  Age: 70 y.o. MRN: [id number]  CC: Follow-up (6 month f/u)   HPI Joseph [name] presents for anxiety, ED, DVT f/u F/u LBP - RLE radiculopathy - in PT  Per Ortho on 01/24/20: "Assessment  Likely still some element of lumbar degenerative change contributing to symptoms of most focal symptoms persist as evidence of trochanteric bursitis affecting the right hip today  Encounter Diagnoses: Trochanteric bursitis of right hip  Plan  Plan for additional conservative treatment with continued physical therapy exercise program and steroid injection for the greater trochanter of the right hip today. Follow up in 3-4 weeks to monitor response. Patient understands that persistent symptoms may later require further evaluation of the lumbar spine "   Outpatient Medications Prior to Visit  Medication Sig Dispense Refill  . ALPRAZolam (XANAX) 1 MG tablet TAKE ONE-HALF TO ONE TABLET BY MOUTH TWO TIMES A DAY AS NEEDED FOR ANXIETY 180 tablet 1  . doxycycline (VIBRA-TABS) 100 MG tablet Take 1 tablet (100 mg total) by mouth 2 (two) times daily. 14 tablet 1  . levothyroxine (SYNTHROID) 25 MCG tablet Take 1 tablet (25 mcg total) by mouth daily. 90 tablet 3  . sildenafil (VIAGRA) 100 MG tablet Take 1 tablet (100 mg total) by mouth as needed for erectile dysfunction. 100 tablet 1  . warfarin (COUMADIN) 10 MG tablet TAKE 1 AND ONE-HALF TABLET BY MOUTH ON MONDAYS AND FRIDAYS AND TAKE 1 TABLET BY MOUTH DAILY ON ALL OTHER DAYS 105 tablet 0  . Cholecalciferol 1000 UNITS tablet Take 1,000 Units by mouth daily. Reported on 04/24/2015 (Patient not taking: Reported on 02/07/2020)    . mupirocin ointment (BACTROBAN) 2 % On lesion qid (Patient not taking: Reported on 02/07/2020) 15 g 0  . predniSONE (DELTASONE) 10 MG tablet Prednisone 10 mg: take 4 tabs a day x 3 days; then 3 tabs a day x 4 days; then 2 tabs a day x 4 days, then 1 tab a day x 6 days, then stop.  Take pc. (Patient not taking: Reported on 02/07/2020) 38 tablet 1   No facility-administered medications prior to visit.    ROS: Review of Systems  Constitutional: Positive for unexpected weight change. Negative for appetite change and fatigue.  HENT: Negative for congestion, nosebleeds, sneezing, sore throat and trouble swallowing.   Eyes: Negative for itching and visual disturbance.  Respiratory: Negative for cough.   Cardiovascular: Positive for leg swelling. Negative for chest pain and palpitations.  Gastrointestinal: Negative for abdominal distention, blood in stool, diarrhea and nausea.  Genitourinary: Negative for frequency and hematuria.  Musculoskeletal: Positive for arthralgias, back pain and gait problem. Negative for joint swelling and neck pain.  Skin: Negative for rash.  Neurological: Negative for dizziness, tremors, speech difficulty and weakness.  Psychiatric/Behavioral: Positive for decreased concentration. Negative for agitation, dysphoric mood and sleep disturbance. The patient is nervous/anxious.     Objective:  BP 132/80 (BP Location: Left Arm)   Pulse 66   Temp 98.2 F (36.8 C) (Oral)   Ht 6' 1.25" (1.861 m)   Wt 295 lb 6.4 oz (134 kg)   SpO2 97%   BMI 38.71 kg/m   BP Readings from Last 3 Encounters:  02/07/20 132/80  11/08/19 140/82  08/02/19 (!) 142/88    Wt Readings from Last 3 Encounters:  02/07/20 295 lb 6.4 oz (134 kg)  11/08/19 286 lb 9.6 oz (130 kg)  08/02/19 (!) 305 lb (  138.3 kg)    Physical Exam Constitutional:      General: He is not in acute distress.    Appearance: He is well-developed. He is obese.     Comments: NAD  HENT:     Mouth/Throat:     Mouth: Oropharynx is clear and moist.  Eyes:     Conjunctiva/sclera: Conjunctivae normal.     Pupils: Pupils are equal, round, and reactive to light.  Neck:     Thyroid: No thyromegaly.     Vascular: No JVD.  Cardiovascular:     Rate and Rhythm: Normal rate and regular rhythm.      Pulses: Intact distal pulses.     Heart sounds: Normal heart sounds. No murmur heard. No friction rub. No gallop.   Pulmonary:     Effort: Pulmonary effort is normal. No respiratory distress.     Breath sounds: Normal breath sounds. No wheezing or rales.  Chest:     Chest wall: No tenderness.  Abdominal:     General: Bowel sounds are normal. There is no distension.     Palpations: Abdomen is soft. There is no mass.     Tenderness: There is no abdominal tenderness. There is no guarding or rebound.  Musculoskeletal:        General: No tenderness. Normal range of motion.     Cervical back: Normal range of motion.     Right lower leg: Edema present.     Left lower leg: Edema present.  Lymphadenopathy:     Cervical: No cervical adenopathy.  Skin:    General: Skin is warm and dry.     Findings: No rash.  Neurological:     Mental Status: He is alert and oriented to person, place, and time.     Cranial Nerves: No cranial nerve deficit.     Motor: No abnormal muscle tone.     Coordination: He displays a negative Romberg sign. Coordination normal.     Gait: Gait normal.     Deep Tendon Reflexes: Reflexes are normal and symmetric.  Psychiatric:        Mood and Affect: Mood and affect normal.        Behavior: Behavior normal.        Thought Content: Thought content normal.        Judgment: Judgment normal.    R hip tender w/ROM Trace edema both legs Lab Results  Component Value Date   WBC 9.9 04/25/2019   HGB 14.6 04/25/2019   HCT 42.0 04/25/2019   PLT 365.0 04/25/2019   GLUCOSE 97 08/02/2019   CHOL 141 04/25/2019   TRIG 131.0 04/25/2019   HDL 43.90 04/25/2019   LDLCALC 71 04/25/2019   ALT 15 04/25/2019   AST 14 04/25/2019   NA 139 08/02/2019   K 4.4 08/02/2019   CL 106 08/02/2019   CREATININE 1.17 08/02/2019   BUN 20 08/02/2019   CO2 26 08/02/2019   TSH 1.25 04/25/2019   PSA 0.48 04/25/2019   INR 3.0 (H) 11/08/2019   HGBA1C 6.1 06/17/2010    No results  found.  Assessment & Plan:    Joseph [name]

## 2020-02-07 NOTE — Assessment & Plan Note (Signed)
  Per Ortho on 01/24/20: "Assessment  Likely still some element of lumbar degenerative change contributing to symptoms of most focal symptoms persist as evidence of trochanteric bursitis affecting the right hip today  Encounter Diagnoses: Trochanteric bursitis of right hip  Plan  Plan for additional conservative treatment with continued physical therapy exercise program and steroid injection for the greater trochanter of the right hip today. Follow up in 3-4 weeks to monitor response. Patient understands that persistent symptoms may later require further evaluation of the lumbar spine " In PT

## 2020-02-07 NOTE — Assessment & Plan Note (Signed)
On Coumadin  INR today   

## 2020-02-07 NOTE — Assessment & Plan Note (Signed)
Chronic On Xanax prn  Potential benefits of a long term benzodiazepines  use as well as potential risks  and complications were explained to the patient and were aknowledged. 

## 2020-02-07 NOTE — Assessment & Plan Note (Signed)
  Per Ortho on 01/24/20: "Assessment  Likely still some element of lumbar degenerative change contributing to symptoms of most focal symptoms persist as evidence of trochanteric bursitis affecting the right hip today  Encounter Diagnoses: Trochanteric bursitis of right hip  Plan  Plan for additional conservative treatment with continued physical therapy exercise program and steroid injection for the greater trochanter of the right hip today. Follow up in 3-4 weeks to monitor response. Patient understands that persistent symptoms may later require further evaluation of the lumbar spine "

## 2020-02-07 NOTE — Telephone Encounter (Signed)
Made lab appt,../lmb

## 2020-02-07 NOTE — Patient Instructions (Signed)
   B-complex with Niacin 100 mg    Lion's mane  

## 2020-02-07 NOTE — Telephone Encounter (Signed)
Okay.  Done.  Thanks 

## 2020-02-07 NOTE — Assessment & Plan Note (Signed)
Try Lion's mane 

## 2020-02-08 LAB — URINALYSIS
Bilirubin Urine: NEGATIVE
Hgb urine dipstick: NEGATIVE
Ketones, ur: NEGATIVE
Leukocytes,Ua: NEGATIVE
Nitrite: NEGATIVE
Specific Gravity, Urine: >=1.03 — AB (ref 1.000–1.030)
Total Protein, Urine: NEGATIVE
Urine Glucose: NEGATIVE
Urobilinogen, UA: 0.2 (ref 0.0–1.0)
pH: 5 (ref 5.0–8.0)

## 2020-02-12 ENCOUNTER — Ambulatory Visit: Payer: Medicare Other | Admitting: Internal Medicine

## 2020-04-09 ENCOUNTER — Ambulatory Visit (INDEPENDENT_AMBULATORY_CARE_PROVIDER_SITE_OTHER): Payer: Medicare Other

## 2020-04-09 DIAGNOSIS — Z0000 Encounter for general adult medical examination without abnormal findings: Secondary | ICD-10-CM | POA: Diagnosis not present

## 2020-04-09 NOTE — Patient Instructions (Signed)
Joseph [name] , Thank you for taking time to come for your Medicare Wellness Visit. I appreciate your ongoing commitment to your health goals. Please review the following plan we discussed and let me know if I can assist you in the future.   Screening recommendations/referrals: Colonoscopy: 01/02/2011; scheduled for 08/2020 Recommended yearly ophthalmology/optometry visit for glaucoma screening and checkup Recommended yearly dental visit for hygiene and checkup  Vaccinations: Influenza vaccine: 10/26/2019 Pneumococcal vaccine: 02/19/2016, 05/19/2017 Tdap vaccine: 03/18/2011 Shingles vaccine: never done   Covid-19: 02/21/2019, 03/11/2019, 10/26/2019  Advanced directives: Advance directive discussed with you today. Even though you declined this today please call our office should you change your mind and we can give you the proper paperwork for you to fill out.  Conditions/risks identified: Yes; Reviewed health maintenance screenings with patient today and relevant education, vaccines, and/or referrals were provided. Please continue to do your personal lifestyle choices by: daily care of teeth and gums, regular physical activity (goal should be 5 days a week for 30 minutes), eat a healthy diet, avoid tobacco and drug use, limiting any alcohol intake, taking a low-dose aspirin (if not allergic or have been advised by your provider otherwise) and taking vitamins and minerals as recommended by your provider. Continue doing brain stimulating activities (puzzles, reading, adult coloring books, staying active) to keep memory sharp. Continue to eat heart healthy diet (full of fruits, vegetables, whole grains, lean protein, water--limit salt, fat, and sugar intake) and increase physical activity as tolerated.  Next appointment: Please schedule your next Medicare Wellness Visit with your Nurse Health Advisor in 1 year by calling [phone].  Preventive Care 69 Years and Older, Male Preventive care refers to  lifestyle choices and visits with your health care provider that can promote health and wellness. What does preventive care include?  A yearly physical exam. This is also called an annual well check.  Dental exams once or twice a year.  Routine eye exams. Ask your health care provider how often you should have your eyes checked.  Personal lifestyle choices, including:  Daily care of your teeth and gums.  Regular physical activity.  Eating a healthy diet.  Avoiding tobacco and drug use.  Limiting alcohol use.  Practicing safe sex.  Taking low doses of aspirin every day.  Taking vitamin and mineral supplements as recommended by your health care provider. What happens during an annual well check? The services and screenings done by your health care provider during your annual well check will depend on your age, overall health, lifestyle risk factors, and family history of disease. Counseling  Your health care provider may ask you questions about your:  Alcohol use.  Tobacco use.  Drug use.  Emotional well-being.  Home and relationship well-being.  Sexual activity.  Eating habits.  History of falls.  Memory and ability to understand (cognition).  Work and work Statistician. Screening  You may have the following tests or measurements:  Height, weight, and BMI.  Blood pressure.  Lipid and cholesterol levels. These may be checked every 5 years, or more frequently if you are over 55 years old.  Skin check.  Lung cancer screening. You may have this screening every year starting at age 6 if you have a 30-pack-year history of smoking and currently smoke or have quit within the past 15 years.  Fecal occult blood test (FOBT) of the stool. You may have this test every year starting at age 48.  Flexible sigmoidoscopy or colonoscopy. You may have a sigmoidoscopy every  5 years or a colonoscopy every 10 years starting at age 8.  Prostate cancer screening.  Recommendations will vary depending on your family history and other risks.  Hepatitis C blood test.  Hepatitis B blood test.  Sexually transmitted disease (STD) testing.  Diabetes screening. This is done by checking your blood sugar (glucose) after you have not eaten for a while (fasting). You may have this done every 1-3 years.  Abdominal aortic aneurysm (AAA) screening. You may need this if you are a current or former smoker.  Osteoporosis. You may be screened starting at age 63 if you are at high risk. Talk with your health care provider about your test results, treatment options, and if necessary, the need for more tests. Vaccines  Your health care provider may recommend certain vaccines, such as:  Influenza vaccine. This is recommended every year.  Tetanus, diphtheria, and acellular pertussis (Tdap, Td) vaccine. You may need a Td booster every 10 years.  Zoster vaccine. You may need this after age 68.  Pneumococcal 13-valent conjugate (PCV13) vaccine. One dose is recommended after age 81.  Pneumococcal polysaccharide (PPSV23) vaccine. One dose is recommended after age 66. Talk to your health care provider about which screenings and vaccines you need and how often you need them. This information is not intended to replace advice given to you by your health care provider. Make sure you discuss any questions you have with your health care provider. Document Released: 02/01/2015 Document Revised: 09/25/2015 Document Reviewed: 11/06/2014 Elsevier Interactive Patient Education  2017 [employer] Prevention in the Home Falls can cause injuries. They can happen to people of all ages. There are many things you can do to make your home safe and to help prevent falls. What can I do on the outside of my home?  Regularly fix the edges of walkways and driveways and fix any cracks.  Remove anything that might make you trip as you walk through a door, such as a raised step or  threshold.  Trim any bushes or trees on the path to your home.  Use bright outdoor lighting.  Clear any walking paths of anything that might make someone trip, such as rocks or tools.  Regularly check to see if handrails are loose or broken. Make sure that both sides of any steps have handrails.  Any raised decks and porches should have guardrails on the edges.  Have any leaves, snow, or ice cleared regularly.  Use sand or salt on walking paths during winter.  Clean up any spills in your garage right away. This includes oil or grease spills. What can I do in the bathroom?  Use night lights.  Install grab bars by the toilet and in the tub and shower. Do not use towel bars as grab bars.  Use non-skid mats or decals in the tub or shower.  If you need to sit down in the shower, use a plastic, non-slip stool.  Keep the floor dry. Clean up any water that spills on the floor as soon as it happens.  Remove soap buildup in the tub or shower regularly.  Attach bath mats securely with double-sided non-slip rug tape.  Do not have throw rugs and other things on the floor that can make you trip. What can I do in the bedroom?  Use night lights.  Make sure that you have a light by your bed that is easy to reach.  Do not use any sheets or blankets that are too big  for your bed. They should not hang down onto the floor.  Have a firm chair that has side arms. You can use this for support while you get dressed.  Do not have throw rugs and other things on the floor that can make you trip. What can I do in the kitchen?  Clean up any spills right away.  Avoid walking on wet floors.  Keep items that you use a lot in easy-to-reach places.  If you need to reach something above you, use a strong step stool that has a grab bar.  Keep electrical cords out of the way.  Do not use floor polish or wax that makes floors slippery. If you must use wax, use non-skid floor wax.  Do not have  throw rugs and other things on the floor that can make you trip. What can I do with my stairs?  Do not leave any items on the stairs.  Make sure that there are handrails on both sides of the stairs and use them. Fix handrails that are broken or loose. Make sure that handrails are as long as the stairways.  Check any carpeting to make sure that it is firmly attached to the stairs. Fix any carpet that is loose or worn.  Avoid having throw rugs at the top or bottom of the stairs. If you do have throw rugs, attach them to the floor with carpet tape.  Make sure that you have a light switch at the top of the stairs and the bottom of the stairs. If you do not have them, ask someone to add them for you. What else can I do to help prevent falls?  Wear shoes that:  Do not have high heels.  Have rubber bottoms.  Are comfortable and fit you well.  Are closed at the toe. Do not wear sandals.  If you use a stepladder:  Make sure that it is fully opened. Do not climb a closed stepladder.  Make sure that both sides of the stepladder are locked into place.  Ask someone to hold it for you, if possible.  Clearly mark and make sure that you can see:  Any grab bars or handrails.  First and last steps.  Where the edge of each step is.  Use tools that help you move around (mobility aids) if they are needed. These include:  Canes.  Walkers.  Scooters.  Crutches.  Turn on the lights when you go into a dark area. Replace any light bulbs as soon as they burn out.  Set up your furniture so you have a clear path. Avoid moving your furniture around.  If any of your floors are uneven, fix them.  If there are any pets around you, be aware of where they are.  Review your medicines with your doctor. Some medicines can make you feel dizzy. This can increase your chance of falling. Ask your doctor what other things that you can do to help prevent falls. This information is not intended to  replace advice given to you by your health care provider. Make sure you discuss any questions you have with your health care provider. Document Released: 11/01/2008 Document Revised: 06/13/2015 Document Reviewed: 02/09/2014 Elsevier Interactive Patient Education  2017 [employer].

## 2020-04-09 NOTE — Progress Notes (Addendum)
I connected with Joseph [name] today by telephone and verified that I am speaking with the correct person using two identifiers. Location patient: home Location provider: work Persons participating in the virtual visit: Keagan Brislin and Lisette Abu, LPN.   I discussed the limitations, risks, security and privacy concerns of performing an evaluation and management service by telephone and the availability of in person appointments. I also discussed with the patient that there may be a patient responsible charge related to this service. The patient expressed understanding and verbally consented to this telephonic visit.    Interactive audio and video telecommunications were attempted between this provider and patient, however failed, due to patient having technical difficulties OR patient did not have access to video capability.  We continued and completed visit with audio only.  Some vital signs may be absent or patient reported.   Time Spent with patient on telephone encounter: 25 minutes  Subjective:   Joseph [name] is a 70 y.o. male who presents for Medicare Annual/Subsequent preventive examination.  Review of Systems    No ROS. Medicare Wellness Virtual Visit. Additional risk factors are reflected in social history. Cardiac Risk Factors include: advanced age (>40men, >86 women);family history of premature cardiovascular disease;male gender     Objective:    Today's Vitals   04/09/20 1035  PainSc: 4    There is no height or weight on file to calculate BMI.  Advanced Directives 04/09/2020 01/02/2011  Does Patient Have a Medical Advance Directive? No Patient does not have advance directive  Would patient like information on creating a medical advance directive? No - Patient declined -  Pre-existing out of facility DNR order (yellow form or pink MOST form) - No    Current Medications (verified) Outpatient Encounter Medications as of 04/09/2020  Medication Sig    ALPRAZolam (XANAX) 1 MG tablet TAKE ONE-HALF TO ONE TABLET BY MOUTH TWO TIMES A DAY AS NEEDED FOR ANXIETY   Cholecalciferol (VITAMIN D3) 50 MCG (2000 UT) capsule Take 1 capsule (2,000 Units total) by mouth daily.   levothyroxine (SYNTHROID) 25 MCG tablet Take 1 tablet (25 mcg total) by mouth daily.   sildenafil (VIAGRA) 100 MG tablet Take 1 tablet (100 mg total) by mouth as needed for erectile dysfunction.   warfarin (COUMADIN) 10 MG tablet TAKE 1 AND ONE-HALF TABLET BY MOUTH ON MONDAYS AND FRIDAYS AND TAKE 1 TABLET BY MOUTH DAILY ON ALL OTHER DAYS   [DISCONTINUED] doxycycline (VIBRA-TABS) 100 MG tablet Take 1 tablet (100 mg total) by mouth 2 (two) times daily.   No facility-administered encounter medications on file as of 04/09/2020.    Allergies (verified) Erythromycin   History: Past Medical History:  Diagnosis Date   Anxiety    DVT of leg (deep venous thrombosis) (HCC)    ED (erectile dysfunction)    Hypothyroidism 2011   Obesity    Osteoarthritis    Warfarin anticoagulation    Past Surgical History:  Procedure Laterality Date   COLONOSCOPY  01/02/2011   Procedure: COLONOSCOPY;  Surgeon: Inda Castle, MD;  Location: [facility];  Service: Endoscopy;  Laterality: N/A;   Dvt   Lysis   HERNIA REPAIR     HERNIA REPAIR     Family History  Problem Relation Age of Onset   Heart disease Father 64       MI   Coronary artery disease Other    Anesthesia problems Neg Hx    Malignant hyperthermia Neg Hx    Social History  Socioeconomic History   Marital status: Married    Spouse name: Not on file   Number of children: Not on file   Years of education: Not on file   Highest education level: Not on file  Occupational History   Not on file  Tobacco Use   Smoking status: Former Smoker   Smokeless tobacco: Former Systems developer  Substance and Sexual Activity   Alcohol use: Yes    Alcohol/week: 4.0 standard drinks    Types: 4 Glasses of wine per week   Drug use: Yes    Frequency:  1.0 times per week    Types: Marijuana   Sexual activity: Yes    Partners: Female  Other Topics Concern   Not on file  Social History Narrative   Regular Exercise -  Started to walk early this year; pt has had pain in back and buttocks area. Pt seeing a chiropractor in [place]      Caffeine use: coffee daily   Social Determinants of Health   Financial Resource Strain: Low Risk    Difficulty of Paying Living Expenses: Not hard at all  Food Insecurity: No Food Insecurity   Worried About Charity fundraiser in the Last Year: Never true   Arboriculturist in the Last Year: Never true  Transportation Needs: No Transportation Needs   Lack of Transportation (Medical): No   Lack of Transportation (Non-Medical): No  Physical Activity: Sufficiently Active   Days of Exercise per Week: 5 days   Minutes of Exercise per Session: 30 min  Stress: No Stress Concern Present   Feeling of Stress : Not at all  Social Connections: Not on file    Tobacco Counseling Counseling given: Not Answered   Clinical Intake:  Pre-visit preparation completed: Yes  Pain : 0-10 Pain Score: 4  Pain Type: Acute pain Pain Location: Hip Pain Orientation: Right Pain Descriptors / Indicators: Discomfort Pain Onset: More than a month ago Pain Frequency: Intermittent Effect of Pain on Daily Activities: Pain can lower motivation to exercise, and prevent you from completing daily tasks. Pain produces disability and affects the quality of life.     Nutritional Risks: None Diabetes: No  How often do you need to have someone help you when you read instructions, pamphlets, or other written materials from your doctor or pharmacy?: 1 - Never What is the last grade level you completed in school?: Bachelor's Degree  Diabetic? no  Interpreter Needed?: No  Information entered by :: Lisette Abu, LPN   Activities of Daily Living In your present state of health, do you have any difficulty performing the  following activities: 04/09/2020 11/08/2019  Hearing? N N  Vision? N N  Difficulty concentrating or making decisions? N N  Walking or climbing stairs? N N  Dressing or bathing? N N  Doing errands, shopping? N N  Preparing Food and eating ? N -  Using the Toilet? N -  In the past six months, have you accidently leaked urine? N -  Do you have problems with loss of bowel control? N -  Managing your Medications? N -  Managing your Finances? N -  Housekeeping or managing your Housekeeping? N -  Some recent data might be hidden    Patient Care Team: Plotnikov, Evie Lacks, MD as PCP - General Inda Castle, MD (Inactive) (Gastroenterology) Sikes, Donzetta Starch (Inactive) as Referring Physician  Indicate any recent Medical Services you may have received from other than [facility] providers in the  past year (date may be approximate).     Assessment:   This is a routine wellness examination for Joseph [name].  Hearing/Vision screen No exam data present  Dietary issues and exercise activities discussed: Current Exercise Habits: Home exercise routine, Type of exercise: walking (stationary bike), Time (Minutes): 30, Frequency (Times/Week): 5, Weekly Exercise (Minutes/Week): 150, Intensity: Mild, Exercise limited by: orthopedic condition(s)  Goals      Patient Stated     For Physical Therapy to relieve this right hip issue.       Depression Screen PHQ 2/9 Scores 04/09/2020 08/02/2019 05/19/2017 02/19/2016 10/23/2015  PHQ - 2 Score 0 0 0 0 0    Fall Risk Fall Risk  04/09/2020 12/14/2018 08/18/2018 05/19/2017 02/19/2016  Falls in the past year? 0 0 (No Data) No Yes  Comment - Emmi Telephone Survey: data to providers prior to load Emmi Telephone Survey: data to providers prior to load - -  Number falls in past yr: 0 - (No Data) - 1  Comment - - Emmi Telephone Survey Actual Response =  - -  Injury with Fall? 0 - - - No  Risk for fall due to : No Fall Risks - - - Impaired balance/gait;Other (Comment)  Risk  for fall due to: Comment - - - - stumbled off a step  Follow up Falls evaluation completed - - - -    FALL RISK PREVENTION PERTAINING TO THE HOME:  Any stairs in or around the home? Yes  If so, are there any without handrails? No  Home free of loose throw rugs in walkways, pet beds, electrical cords, etc? Yes  Adequate lighting in your home to reduce risk of falls? Yes   ASSISTIVE DEVICES UTILIZED TO PREVENT FALLS:  Life alert? No  Use of a cane, walker or w/c? No  Grab bars in the bathroom? Yes  Shower chair or bench in shower? Yes  Elevated toilet seat or a handicapped toilet? Yes   TIMED UP AND GO:  Was the test performed? No .  Length of time to ambulate 10 feet: 0 sec.   Gait steady and fast without use of assistive device  Cognitive Function: No flowsheet data found.         Immunizations Immunization History  Administered Date(s) Administered   Influenza Split 10/26/2011   Influenza Whole 10/12/2007, 10/30/2008, 11/12/2009   Influenza, High Dose Seasonal PF 10/23/2015, 11/18/2016, 11/17/2017   Influenza,inj,Quad PF,6+ Mos 12/07/2012, 09/20/2013, 10/03/2014   PFIZER(Purple Top)SARS-COV-2 Vaccination 02/21/2019, 03/11/2019, 10/26/2019   Pneumococcal Conjugate-13 02/19/2016   Pneumococcal Polysaccharide-23 05/19/2017   Tdap 03/18/2011   Zoster 09/06/2010    TDAP status: Up to date  Flu Vaccine status: Up to date  Pneumococcal vaccine status: Up to date  Covid-19 vaccine status: Completed vaccines  Qualifies for Shingles Vaccine? Yes   Zostavax completed Yes   Shingrix Completed?: No.    Education has been provided regarding the importance of this vaccine. Patient has been advised to call insurance company to determine out of pocket expense if they have not yet received this vaccine. Advised may also receive vaccine at local pharmacy or Health Dept. Verbalized acceptance and understanding.  Screening Tests Health Maintenance  Topic Date Due   COVID-19  Vaccine (4 - Booster for Pfizer series) 04/25/2020   COLONOSCOPY (Pts 45-41yrs Insurance coverage will need to be confirmed)  01/01/2021   TETANUS/TDAP  03/17/2021   INFLUENZA VACCINE  Completed   Hepatitis C Screening  Completed   PNA vac Low  Risk Adult  Completed   HPV VACCINES  Aged Out    Health Maintenance  There are no preventive care reminders to display for this patient.  Colorectal cancer screening: Type of screening: Colonoscopy. Completed 01/02/2011. Repeat every 5 years  Lung Cancer Screening: (Low Dose CT Chest recommended if Age 26-80 years, 30 pack-year currently smoking OR have quit w/in 15years.) does qualify.   Lung Cancer Screening Referral: no  Additional Screening:  Hepatitis C Screening: does qualify; Completed yes  Vision Screening: Recommended annual ophthalmology exams for early detection of glaucoma and other disorders of the eye. Is the patient up to date with their annual eye exam?  No  Who is the provider or what is the name of the office in which the patient attends annual eye exams? Refused If pt is not established with a provider, would they like to be referred to a provider to establish care? No .   Dental Screening: Recommended annual dental exams for proper oral hygiene  Community Resource Referral / Chronic Care Management: CRR required this visit?  No   CCM required this visit?  No      Plan:     I have personally reviewed and noted the following in the patient's chart:   Medical and social history Use of alcohol, tobacco or illicit drugs  Current medications and supplements Functional ability and status Nutritional status Physical activity Advanced directives List of other physicians Hospitalizations, surgeries, and ER visits in previous 12 months Vitals Screenings to include cognitive, depression, and falls Referrals and appointments  In addition, I have reviewed and discussed with patient certain preventive protocols,  quality metrics, and best practice recommendations. A written personalized care plan for preventive services as well as general preventive health recommendations were provided to patient.     Sheral Flow, LPN   09/13/4156   Nurse Notes:  Patient is cogitatively intact. There were no vitals filed for this visit. There is no height or weight on file to calculate BMI. Patient stated that he has no issues with gait or balance; does not use any assistive devices. Medications reviewed with patient; no opioid use noted.  Medical screening examination/treatment/procedure(s) were performed by non-physician practitioner and as supervising physician I was immediately available for consultation/collaboration.  I agree with above. Lew Dawes, MD

## 2020-04-22 ENCOUNTER — Other Ambulatory Visit: Payer: Self-pay | Admitting: Internal Medicine

## 2020-05-06 ENCOUNTER — Other Ambulatory Visit: Payer: Self-pay

## 2020-05-06 ENCOUNTER — Encounter: Payer: Self-pay | Admitting: Internal Medicine

## 2020-05-06 ENCOUNTER — Ambulatory Visit (INDEPENDENT_AMBULATORY_CARE_PROVIDER_SITE_OTHER): Payer: Medicare Other | Admitting: Internal Medicine

## 2020-05-06 DIAGNOSIS — F411 Generalized anxiety disorder: Secondary | ICD-10-CM

## 2020-05-06 DIAGNOSIS — Z7901 Long term (current) use of anticoagulants: Secondary | ICD-10-CM | POA: Diagnosis not present

## 2020-05-06 DIAGNOSIS — R413 Other amnesia: Secondary | ICD-10-CM | POA: Diagnosis not present

## 2020-05-06 DIAGNOSIS — B079 Viral wart, unspecified: Secondary | ICD-10-CM

## 2020-05-06 LAB — COMPREHENSIVE METABOLIC PANEL
ALT: 20 U/L (ref 0–53)
AST: 18 U/L (ref 0–37)
Albumin: 3.9 g/dL (ref 3.5–5.2)
Alkaline Phosphatase: 66 U/L (ref 39–117)
BUN: 15 mg/dL (ref 6–23)
CO2: 27 mEq/L (ref 19–32)
Calcium: 8.9 mg/dL (ref 8.4–10.5)
Chloride: 106 mEq/L (ref 96–112)
Creatinine, Ser: 1.05 mg/dL (ref 0.40–1.50)
GFR: 72.31 mL/min (ref >60.00)
Glucose, Bld: 120 mg/dL — ABNORMAL HIGH (ref 70–99)
Potassium: 4.4 mEq/L (ref 3.5–5.1)
Sodium: 139 mEq/L (ref 135–145)
Total Bilirubin: 0.5 mg/dL (ref 0.2–1.2)
Total Protein: 6.6 g/dL (ref 6.0–8.3)

## 2020-05-06 LAB — CBC WITH DIFFERENTIAL/PLATELET
Basophils Absolute: 0 10*3/uL (ref 0.0–0.1)
Basophils Relative: 0.4 % (ref 0.0–3.0)
Eosinophils Absolute: 0.1 10*3/uL (ref 0.0–0.7)
Eosinophils Relative: 1.5 % (ref 0.0–5.0)
HCT: 43.4 % (ref 39.0–52.0)
Hemoglobin: 14.9 g/dL (ref 13.0–17.0)
Lymphocytes Relative: 19.5 % (ref 12.0–46.0)
Lymphs Abs: 1.9 10*3/uL (ref 0.7–4.0)
MCHC: 34.3 g/dL (ref 30.0–36.0)
MCV: 97.9 fl (ref 78.0–100.0)
Monocytes Absolute: 0.6 10*3/uL (ref 0.1–1.0)
Monocytes Relative: 6.6 % (ref 3.0–12.0)
Neutro Abs: 6.8 10*3/uL (ref 1.4–7.7)
Neutrophils Relative %: 72 % (ref 43.0–77.0)
Platelets: 363 10*3/uL (ref 150.0–400.0)
RBC: 4.44 Mil/uL (ref 4.22–5.81)
RDW: 12.7 % (ref 11.5–15.5)
WBC: 9.5 10*3/uL (ref 4.0–10.5)

## 2020-05-06 LAB — PROTIME-INR
INR: 3.5 ratio — ABNORMAL HIGH (ref 0.8–1.0)
Prothrombin Time: 38.2 s — ABNORMAL HIGH (ref 9.6–13.1)

## 2020-05-06 MED ORDER — SILDENAFIL CITRATE 100 MG PO TABS: 100.0000 mg | ORAL_TABLET | ORAL | 1 refills | Status: DC | PRN

## 2020-05-06 MED ORDER — WARFARIN SODIUM 10 MG PO TABS: ORAL_TABLET | ORAL | 1 refills | Status: DC

## 2020-05-06 MED ORDER — ALPRAZOLAM 1 MG PO TABS: ORAL_TABLET | ORAL | 1 refills | Status: DC

## 2020-05-06 NOTE — Progress Notes (Signed)
Subjective:  Patient ID: Joseph [name], male    DOB: 09/02/1950  Age: 70 y.o. MRN: [id number]  CC: Follow-up (3 month f/u)   HPI CAINEN BURNHAM presents for DVT, coumadin Rx, hypothyroidism, anxiety f/u C/o R hip pain - he saw Dr Jenne Campus (orthopedic surgery) C/o "wart" on chin, L knee     Outpatient Medications Prior to Visit  Medication Sig Dispense Refill  . ALPRAZolam (XANAX) 1 MG tablet TAKE ONE-HALF TO ONE TABLET BY MOUTH TWO TIMES A DAY AS NEEDED FOR ANXIETY 180 tablet 1  . Cholecalciferol (VITAMIN D3) 50 MCG (2000 UT) capsule Take 1 capsule (2,000 Units total) by mouth daily. 100 capsule 3  . levothyroxine (SYNTHROID) 25 MCG tablet TAKE ONE TABLET BY MOUTH DAILY 90 tablet 3  . sildenafil (VIAGRA) 100 MG tablet Take 1 tablet (100 mg total) by mouth as needed for erectile dysfunction. 100 tablet 1  . warfarin (COUMADIN) 10 MG tablet TAKE 1 AND ONE-HALF TABLET BY MOUTH ON MONDAYS AND FRIDAYS AND TAKE 1 TABLET BY MOUTH DAILY ON ALL OTHER DAYS 105 tablet 0   No facility-administered medications prior to visit.    ROS: Review of Systems  Objective:  BP 140/82 (BP Location: Left Arm)   Pulse 70   Temp 98.3 F (36.8 C) (Oral)   Ht 6\' 1"  (1.854 m)   Wt 297 lb 12.8 oz (135.1 kg)   SpO2 96%   BMI 39.29 kg/m   BP Readings from Last 3 Encounters:  05/06/20 140/82  02/07/20 132/80  11/08/19 140/82    Wt Readings from Last 3 Encounters:  05/06/20 297 lb 12.8 oz (135.1 kg)  02/07/20 295 lb 6.4 oz (134 kg)  11/08/19 286 lb 9.6 oz (130 kg)    Physical Exam    Procedure Note :     Procedure : Cryosurgery   Indication:  Wart(s)     Risks including unsuccessful procedure , bleeding, infection, bruising, scar, a need for a repeat  procedure and others were explained to the patient in detail as well as the benefits. Informed consent was obtained verbally.    2 lesion(s) one on the chin 3 mm and one on the left knee 5 mm that looks more like dermatofibroma   was/were  treated with liquid nitrogen on a Q-tip in a usual fasion . Band-Aid was applied and antibiotic ointment was given for a later use.   Tolerated well. Complications none.   Postprocedure instructions :     Keep the wounds clean. You can wash them with liquid soap and water. Pat dry with gauze or a Kleenex tissue  Before applying antibiotic ointment and a Band-Aid.   You need to report immediately  if  any signs of infection develop.    Lab Results  Component Value Date   WBC 10.3 02/07/2020   HGB 14.2 02/07/2020   HCT 41.9 02/07/2020   PLT 379.0 02/07/2020   GLUCOSE 96 02/07/2020   CHOL 142 02/07/2020   TRIG 96.0 02/07/2020   HDL 59.30 02/07/2020   LDLCALC 64 02/07/2020   ALT 17 02/07/2020   AST 15 02/07/2020   NA 138 02/07/2020   K 4.4 02/07/2020   CL 104 02/07/2020   CREATININE 1.03 02/07/2020   BUN 19 02/07/2020   CO2 28 02/07/2020   TSH 0.96 02/07/2020   PSA 0.47 02/07/2020   INR 2.7 (H) 02/07/2020   HGBA1C 6.1 06/17/2010    No results found.  Assessment & Plan:  [name], MD

## 2020-05-06 NOTE — Assessment & Plan Note (Signed)
Cont w/Lion's mane

## 2020-05-06 NOTE — Assessment & Plan Note (Addendum)
See cryo Knee lesion should be biopsied if relapsed, not removed completely

## 2020-05-06 NOTE — Assessment & Plan Note (Signed)
On Xanax prn  Potential benefits of a long term benzodiazepines use as well as potential risks  and complications were explained to the patient and were aknowledged. 

## 2020-07-05 LAB — HM COLONOSCOPY

## 2020-07-12 ENCOUNTER — Encounter: Payer: Self-pay | Admitting: Internal Medicine

## 2020-09-02 ENCOUNTER — Other Ambulatory Visit: Payer: Self-pay

## 2020-09-02 ENCOUNTER — Other Ambulatory Visit (INDEPENDENT_AMBULATORY_CARE_PROVIDER_SITE_OTHER): Payer: Medicare Other

## 2020-09-02 ENCOUNTER — Encounter: Payer: Self-pay | Admitting: Internal Medicine

## 2020-09-02 ENCOUNTER — Ambulatory Visit (INDEPENDENT_AMBULATORY_CARE_PROVIDER_SITE_OTHER): Payer: Medicare Other | Admitting: Internal Medicine

## 2020-09-02 DIAGNOSIS — R739 Hyperglycemia, unspecified: Secondary | ICD-10-CM

## 2020-09-02 DIAGNOSIS — M170 Bilateral primary osteoarthritis of knee: Secondary | ICD-10-CM | POA: Diagnosis not present

## 2020-09-02 DIAGNOSIS — M25562 Pain in left knee: Secondary | ICD-10-CM

## 2020-09-02 DIAGNOSIS — F411 Generalized anxiety disorder: Secondary | ICD-10-CM

## 2020-09-02 DIAGNOSIS — M25561 Pain in right knee: Secondary | ICD-10-CM

## 2020-09-02 DIAGNOSIS — Z6836 Body mass index (BMI) 36.0-36.9, adult: Secondary | ICD-10-CM | POA: Diagnosis not present

## 2020-09-02 DIAGNOSIS — Z7901 Long term (current) use of anticoagulants: Secondary | ICD-10-CM | POA: Diagnosis not present

## 2020-09-02 DIAGNOSIS — G8929 Other chronic pain: Secondary | ICD-10-CM

## 2020-09-02 DIAGNOSIS — M25551 Pain in right hip: Secondary | ICD-10-CM

## 2020-09-02 LAB — PROTIME-INR
INR: 3.3 ratio — ABNORMAL HIGH (ref 0.8–1.0)
Prothrombin Time: 33.5 s — ABNORMAL HIGH (ref 9.6–13.1)

## 2020-09-02 MED ORDER — SHINGRIX 50 MCG/0.5ML IM SUSR: 0.5000 mL | Freq: Once | INTRAMUSCULAR | 1 refills | Status: AC

## 2020-09-02 MED ORDER — SEMAGLUTIDE (1 MG/DOSE) 4 MG/3ML ~~LOC~~ SOPN: 1.0000 mg | PEN_INJECTOR | SUBCUTANEOUS | 3 refills | Status: DC

## 2020-09-02 NOTE — Assessment & Plan Note (Signed)
Check out Dr. Jenny Reichmann B. Cornelia Copa, [place], [place] - Orthopedic Surgery

## 2020-09-02 NOTE — Progress Notes (Signed)
Subjective:  Patient ID: Joseph [name], male    DOB: January 01, 1951  Age: 70 y.o. MRN: [id number]  CC: Follow-up (4 month f/u)   HPI Joseph [name] presents for OA,  R hip pain, B knee pain, obesity, anticoagulation Joseph [name] had a blood clot after colonoscopy.  Follow-up on anxiety.  Joseph [name] has been having difficulties with weight loss...  Outpatient Medications Prior to Visit  Medication Sig Dispense Refill   ALPRAZolam (XANAX) 1 MG tablet TAKE ONE-HALF TO ONE TABLET BY MOUTH TWO TIMES A DAY AS NEEDED FOR ANXIETY 180 tablet 1   Cholecalciferol (VITAMIN D3) 50 MCG (2000 UT) capsule Take 1 capsule (2,000 Units total) by mouth daily. 100 capsule 3   levothyroxine (SYNTHROID) 25 MCG tablet TAKE ONE TABLET BY MOUTH DAILY 90 tablet 3   sildenafil (VIAGRA) 100 MG tablet Take 1 tablet (100 mg total) by mouth as needed for erectile dysfunction. 100 tablet 1   warfarin (COUMADIN) 10 MG tablet TAKE 1 A DAY or as directed 105 tablet 1   No facility-administered medications prior to visit.    ROS: Review of Systems  Constitutional:  Negative for appetite change, fatigue and unexpected weight change.  HENT:  Negative for congestion, nosebleeds, sneezing, sore throat and trouble swallowing.   Eyes:  Negative for itching and visual disturbance.  Respiratory:  Negative for cough.   Cardiovascular:  Negative for chest pain, palpitations and leg swelling.  Gastrointestinal:  Negative for abdominal distention, blood in stool, diarrhea and nausea.  Genitourinary:  Negative for frequency and hematuria.  Musculoskeletal:  Positive for arthralgias, back pain and gait problem. Negative for joint swelling and neck pain.  Skin:  Negative for rash.  Neurological:  Negative for dizziness, tremors, speech difficulty and weakness.  Psychiatric/Behavioral:  Negative for agitation, dysphoric mood, sleep disturbance and suicidal ideas. The patient is not nervous/anxious.    Objective:  BP (!) 148/90 (BP Location: Left  Arm)   Pulse (!) 55   Temp 98.3 F (36.8 C) (Oral)   Ht '6\' 1"'$  (1.854 m)   Wt 297 lb 12.8 oz (135.1 kg)   SpO2 99%   BMI 39.29 kg/m   BP Readings from Last 3 Encounters:  09/02/20 (!) 148/90  05/06/20 140/82  02/07/20 132/80    Wt Readings from Last 3 Encounters:  09/02/20 297 lb 12.8 oz (135.1 kg)  05/06/20 297 lb 12.8 oz (135.1 kg)  02/07/20 295 lb 6.4 oz (134 kg)    Physical Exam Constitutional:      General: He is not in acute distress.    Appearance: He is well-developed. He is obese.     Comments: NAD  Eyes:     Conjunctiva/sclera: Conjunctivae normal.     Pupils: Pupils are equal, round, and reactive to light.  Neck:     Thyroid: No thyromegaly.     Vascular: No JVD.  Cardiovascular:     Rate and Rhythm: Normal rate and regular rhythm.     Heart sounds: Normal heart sounds. No murmur heard.   No friction rub. No gallop.  Pulmonary:     Effort: Pulmonary effort is normal. No respiratory distress.     Breath sounds: Normal breath sounds. No wheezing or rales.  Chest:     Chest wall: No tenderness.  Abdominal:     General: Bowel sounds are normal. There is no distension.     Palpations: Abdomen is soft. There is no mass.     Tenderness: There is no abdominal  tenderness. There is no guarding or rebound.  Musculoskeletal:        General: No tenderness. Normal range of motion.     Cervical back: Normal range of motion.     Right lower leg: Edema present.     Left lower leg: Edema present.  Lymphadenopathy:     Cervical: No cervical adenopathy.  Skin:    General: Skin is warm and dry.     Findings: No rash.  Neurological:     Mental Status: He is alert and oriented to person, place, and time.     Cranial Nerves: No cranial nerve deficit.     Motor: No abnormal muscle tone.     Coordination: Coordination normal.     Gait: Gait normal.     Deep Tendon Reflexes: Reflexes are normal and symmetric.  Psychiatric:        Behavior: Behavior normal.         Thought Content: Thought content normal.        Judgment: Judgment normal.  R hip, B knees w/pain Stiff joints Compression socks are on  Lab Results  Component Value Date   WBC 9.5 05/06/2020   HGB 14.9 05/06/2020   HCT 43.4 05/06/2020   PLT 363.0 05/06/2020   GLUCOSE 120 (H) 05/06/2020   CHOL 142 02/07/2020   TRIG 96.0 02/07/2020   HDL 59.30 02/07/2020   LDLCALC 64 02/07/2020   ALT 20 05/06/2020   AST 18 05/06/2020   NA 139 05/06/2020   K 4.4 05/06/2020   CL 106 05/06/2020   CREATININE 1.05 05/06/2020   BUN 15 05/06/2020   CO2 27 05/06/2020   TSH 0.96 02/07/2020   PSA 0.47 02/07/2020   INR 3.5 (H) 05/06/2020   HGBA1C 6.1 06/17/2010    No results found.  Assessment & Plan:     [name], MD

## 2020-09-02 NOTE — Assessment & Plan Note (Addendum)
Joseph [name] had another blood clot in the R leg after colonoscopy 6/22.  On Coumadin.  We discussed Xarelto and Eliquis options with the patient

## 2020-09-02 NOTE — Assessment & Plan Note (Addendum)
Discussed Mounjaro or Ozempic options.  Low-carb diet.  Ozempic prescription provided

## 2020-09-02 NOTE — Patient Instructions (Signed)
Check out Dr. Jenny Reichmann B. Cornelia Copa, [place], [place] - Orthopedic Surgery

## 2020-09-03 DIAGNOSIS — R739 Hyperglycemia, unspecified: Secondary | ICD-10-CM | POA: Insufficient documentation

## 2020-09-03 NOTE — Assessment & Plan Note (Signed)
Stable.  On Xanax 1 mg twice daily prn  Potential benefits of a long term benzodiazepines  use as well as potential risks  and complications were explained to the patient and were aknowledged.

## 2020-09-03 NOTE — Assessment & Plan Note (Signed)
Worse.  He will see Dr. Jenny Reichmann B. Cornelia Copa, [place], [place] - Orthopedic Surgery

## 2020-09-03 NOTE — Assessment & Plan Note (Signed)
Worse.  He will see Dr. Jenny Reichmann B. Cornelia Copa, [place], [place] - Orthopedic Surgery

## 2020-09-03 NOTE — Assessment & Plan Note (Signed)
Prediabetes.  We will start Ozempic injections

## 2020-11-29 ENCOUNTER — Other Ambulatory Visit: Payer: Self-pay | Admitting: Internal Medicine

## 2020-12-03 ENCOUNTER — Other Ambulatory Visit: Payer: Self-pay | Admitting: Internal Medicine

## 2020-12-03 DIAGNOSIS — Z7901 Long term (current) use of anticoagulants: Secondary | ICD-10-CM

## 2020-12-03 NOTE — Telephone Encounter (Signed)
As far as the pt's chart goes back this pt has not been seen in the coumadin clinic. Will forward refill request to PCP. Per pt msg on 08/04/19, Jenny Reichmann, RN reported PCP is dosing pts coumadin.

## 2021-01-02 ENCOUNTER — Encounter: Payer: Self-pay | Admitting: Internal Medicine

## 2021-01-02 ENCOUNTER — Other Ambulatory Visit: Payer: Self-pay

## 2021-01-02 ENCOUNTER — Ambulatory Visit (INDEPENDENT_AMBULATORY_CARE_PROVIDER_SITE_OTHER): Payer: Medicare Other | Admitting: Internal Medicine

## 2021-01-02 VITALS — BP 124/76 | HR 58 | Temp 98.7°F | Ht 73.0 in | Wt 276.4 lb

## 2021-01-02 DIAGNOSIS — E66812 Obesity, class 2: Secondary | ICD-10-CM

## 2021-01-02 DIAGNOSIS — M25551 Pain in right hip: Secondary | ICD-10-CM

## 2021-01-02 DIAGNOSIS — G8929 Other chronic pain: Secondary | ICD-10-CM

## 2021-01-02 DIAGNOSIS — Z6836 Body mass index (BMI) 36.0-36.9, adult: Secondary | ICD-10-CM

## 2021-01-02 DIAGNOSIS — F411 Generalized anxiety disorder: Secondary | ICD-10-CM | POA: Diagnosis not present

## 2021-01-02 DIAGNOSIS — Z7901 Long term (current) use of anticoagulants: Secondary | ICD-10-CM

## 2021-01-02 DIAGNOSIS — R739 Hyperglycemia, unspecified: Secondary | ICD-10-CM | POA: Diagnosis not present

## 2021-01-02 LAB — COMPREHENSIVE METABOLIC PANEL
ALT: 13 U/L (ref 0–53)
AST: 13 U/L (ref 0–37)
Albumin: 4.1 g/dL (ref 3.5–5.2)
Alkaline Phosphatase: 70 U/L (ref 39–117)
BUN: 15 mg/dL (ref 6–23)
CO2: 27 mEq/L (ref 19–32)
Calcium: 9.3 mg/dL (ref 8.4–10.5)
Chloride: 104 mEq/L (ref 96–112)
Creatinine, Ser: 1.07 mg/dL (ref 0.40–1.50)
GFR: 70.36 mL/min (ref >60.00)
Glucose, Bld: 91 mg/dL (ref 70–99)
Potassium: 4.5 mEq/L (ref 3.5–5.1)
Sodium: 138 mEq/L (ref 135–145)
Total Bilirubin: 0.6 mg/dL (ref 0.2–1.2)
Total Protein: 7.1 g/dL (ref 6.0–8.3)

## 2021-01-02 LAB — HEMOGLOBIN A1C: Hgb A1c MFr Bld: 6.1 % (ref 4.6–6.5)

## 2021-01-02 LAB — T4, FREE: Free T4: 0.88 ng/dL (ref 0.60–1.60)

## 2021-01-02 LAB — PROTIME-INR
INR: 3 ratio — ABNORMAL HIGH (ref 0.8–1.0)
Prothrombin Time: 31 s — ABNORMAL HIGH (ref 9.6–13.1)

## 2021-01-02 LAB — TESTOSTERONE: Testosterone: 233.54 ng/dL — ABNORMAL LOW (ref 300.00–890.00)

## 2021-01-02 LAB — TSH: TSH: 1.23 u[IU]/mL (ref 0.35–5.50)

## 2021-01-02 MED ORDER — RIVAROXABAN 20 MG PO TABS: 20.0000 mg | ORAL_TABLET | Freq: Every day | ORAL | 11 refills | Status: DC

## 2021-01-02 MED ORDER — SILDENAFIL CITRATE 100 MG PO TABS: 100.0000 mg | ORAL_TABLET | ORAL | 1 refills | Status: DC | PRN

## 2021-01-02 NOTE — Assessment & Plan Note (Signed)
Cont on Coumadin °

## 2021-01-02 NOTE — Assessment & Plan Note (Signed)
Joseph [name] lost 21 lbs on diet

## 2021-01-02 NOTE — Assessment & Plan Note (Signed)
On Xanax

## 2021-01-02 NOTE — Patient Instructions (Signed)

## 2021-01-02 NOTE — Progress Notes (Signed)
Subjective:  Patient ID: Joseph [name], male    DOB: 1950/08/02  Age: 70 y.o. MRN: [id number]  CC: Follow-up (4 month f/u)   HPI Joseph [name] presents for pre-DM, R hip OA, anxiety Joseph [name] is ready to switch to [employer] lost 21 lbs on diet C/o ED - worse  Outpatient Medications Prior to Visit  Medication Sig Dispense Refill   ALPRAZolam (XANAX) 1 MG tablet TAKE 1/2 TO 1 TABLET BY MOUTH TWO TIMES A DAY AS NEEDED FOR ANXIETY 180 tablet 1   Cholecalciferol (VITAMIN D3) 50 MCG (2000 UT) capsule Take 1 capsule (2,000 Units total) by mouth daily. 100 capsule 3   levothyroxine (SYNTHROID) 25 MCG tablet TAKE ONE TABLET BY MOUTH DAILY 90 tablet 3   Semaglutide, 1 MG/DOSE, 4 MG/3ML SOPN Inject 1 mg as directed once a week. 3 mL 3   warfarin (COUMADIN) 10 MG tablet TAKE ONE TABLET BY MOUTH DAILY OR AS DIRECTED 105 tablet 1   sildenafil (VIAGRA) 100 MG tablet Take 1 tablet (100 mg total) by mouth as needed for erectile dysfunction. 100 tablet 1   No facility-administered medications prior to visit.    ROS: Review of Systems  Constitutional:  Negative for appetite change, fatigue and unexpected weight change.  HENT:  Negative for congestion, nosebleeds, sneezing, sore throat and trouble swallowing.   Eyes:  Negative for itching and visual disturbance.  Respiratory:  Negative for cough.   Cardiovascular:  Positive for leg swelling. Negative for chest pain and palpitations.  Gastrointestinal:  Negative for abdominal distention, blood in stool, diarrhea and nausea.  Genitourinary:  Negative for frequency and hematuria.  Musculoskeletal:  Positive for arthralgias and gait problem. Negative for back pain, joint swelling and neck pain.  Skin:  Negative for rash.  Neurological:  Negative for dizziness, tremors, speech difficulty and weakness.  Psychiatric/Behavioral:  Negative for agitation, dysphoric mood and sleep disturbance. The patient is nervous/anxious.    Objective:  BP 124/76 (BP  Location: Left Arm)    Pulse (!) 58    Temp 98.7 F (37.1 C) (Oral)    Ht 6\' 1"  (1.854 m)    Wt 276 lb 6.4 oz (125.4 kg)    SpO2 97%    BMI 36.47 kg/m   BP Readings from Last 3 Encounters:  01/02/21 124/76  09/02/20 (!) 148/90  05/06/20 140/82    Wt Readings from Last 3 Encounters:  01/02/21 276 lb 6.4 oz (125.4 kg)  09/02/20 297 lb 12.8 oz (135.1 kg)  05/06/20 297 lb 12.8 oz (135.1 kg)    Physical Exam Constitutional:      General: He is not in acute distress.    Appearance: He is well-developed. He is obese.     Comments: NAD  Eyes:     Conjunctiva/sclera: Conjunctivae normal.     Pupils: Pupils are equal, round, and reactive to light.  Neck:     Thyroid: No thyromegaly.     Vascular: No JVD.  Cardiovascular:     Rate and Rhythm: Normal rate and regular rhythm.     Heart sounds: Normal heart sounds. No murmur heard.   No friction rub. No gallop.  Pulmonary:     Effort: Pulmonary effort is normal. No respiratory distress.     Breath sounds: Normal breath sounds. No wheezing or rales.  Chest:     Chest wall: No tenderness.  Abdominal:     General: Bowel sounds are normal. There is no distension.  Palpations: Abdomen is soft. There is no mass.     Tenderness: There is no abdominal tenderness. There is no guarding or rebound.  Musculoskeletal:        General: No tenderness. Normal range of motion.     Cervical back: Normal range of motion.  Lymphadenopathy:     Cervical: No cervical adenopathy.  Skin:    General: Skin is warm and dry.     Findings: No rash.  Neurological:     Mental Status: He is alert and oriented to person, place, and time.     Cranial Nerves: No cranial nerve deficit.     Motor: No abnormal muscle tone.     Coordination: Coordination normal.     Gait: Gait normal.     Deep Tendon Reflexes: Reflexes are normal and symmetric.  Psychiatric:        Behavior: Behavior normal.        Thought Content: Thought content normal.        Judgment:  Judgment normal.  RLE  is less swollen  Lab Results  Component Value Date   WBC 9.5 05/06/2020   HGB 14.9 05/06/2020   HCT 43.4 05/06/2020   PLT 363.0 05/06/2020   GLUCOSE 91 01/02/2021   CHOL 142 02/07/2020   TRIG 96.0 02/07/2020   HDL 59.30 02/07/2020   LDLCALC 64 02/07/2020   ALT 13 01/02/2021   AST 13 01/02/2021   NA 138 01/02/2021   K 4.5 01/02/2021   CL 104 01/02/2021   CREATININE 1.07 01/02/2021   BUN 15 01/02/2021   CO2 27 01/02/2021   TSH 1.23 01/02/2021   PSA 0.47 02/07/2020   INR 3.0 (H) 01/02/2021   HGBA1C 6.1 01/02/2021    No results found.  Assessment & Plan:   Problem List Items Addressed This Visit     Generalized anxiety disorder    On Xanax      Relevant Orders   T4, free (Completed)   Testosterone (Completed)   TSH (Completed)   Hip pain, chronic, right    Not better. Seeing Ortho. Joseph [name] lost 21 lbs on diet      Hyperglycemia - Primary   Relevant Orders   Comprehensive metabolic panel (Completed)   Hemoglobin A1c (Completed)   Testosterone (Completed)   Long term current use of anticoagulant therapy    Cont on Coumadin      Relevant Orders   Protime-INR (Completed)   Obesity    Joseph [name] lost 21 lbs on diet         Meds ordered this encounter  Medications   rivaroxaban (XARELTO) 20 MG TABS tablet    Sig: Take 1 tablet (20 mg total) by mouth daily.    Dispense:  30 tablet    Refill:  11   sildenafil (VIAGRA) 100 MG tablet    Sig: Take 1 tablet (100 mg total) by mouth as needed for erectile dysfunction.    Dispense:  100 tablet    Refill:  1    [facility]--[phone]      Follow-up: Return in about 3 months (around 04/02/2021) for a follow-up visit.  [name], MD

## 2021-01-02 NOTE — Assessment & Plan Note (Signed)
Not better. Seeing Ortho. Joseph [name] lost 21 lbs on diet

## 2021-03-17 ENCOUNTER — Encounter: Payer: Self-pay | Admitting: Internal Medicine

## 2021-03-19 ENCOUNTER — Other Ambulatory Visit: Payer: Self-pay | Admitting: Internal Medicine

## 2021-04-07 ENCOUNTER — Ambulatory Visit: Payer: Medicare Other | Admitting: Internal Medicine

## 2021-04-21 ENCOUNTER — Telehealth: Payer: Self-pay | Admitting: Internal Medicine

## 2021-04-21 NOTE — Telephone Encounter (Signed)
Left message for patient to call back to schedule Medicare Annual Wellness Visit  ? ?Last AWV  04/09/20 ? ?Please schedule at anytime with [facility] if patient calls the office back.   ? ? ?Any questions, please call me at [phone]  ?

## 2021-04-29 ENCOUNTER — Ambulatory Visit: Payer: Medicare Other | Admitting: Internal Medicine

## 2021-04-29 DIAGNOSIS — D369 Benign neoplasm, unspecified site: Secondary | ICD-10-CM | POA: Diagnosis not present

## 2021-04-29 DIAGNOSIS — D126 Benign neoplasm of colon, unspecified: Secondary | ICD-10-CM | POA: Diagnosis not present

## 2021-04-29 DIAGNOSIS — Z86010 Personal history of colonic polyps: Secondary | ICD-10-CM | POA: Diagnosis not present

## 2021-04-29 DIAGNOSIS — Z9189 Other specified personal risk factors, not elsewhere classified: Secondary | ICD-10-CM | POA: Diagnosis not present

## 2021-05-13 ENCOUNTER — Encounter: Payer: Self-pay | Admitting: Internal Medicine

## 2021-05-13 ENCOUNTER — Ambulatory Visit (INDEPENDENT_AMBULATORY_CARE_PROVIDER_SITE_OTHER): Payer: Medicare Other | Admitting: Internal Medicine

## 2021-05-13 DIAGNOSIS — M170 Bilateral primary osteoarthritis of knee: Secondary | ICD-10-CM

## 2021-05-13 DIAGNOSIS — Z86718 Personal history of other venous thrombosis and embolism: Secondary | ICD-10-CM

## 2021-05-13 DIAGNOSIS — K219 Gastro-esophageal reflux disease without esophagitis: Secondary | ICD-10-CM

## 2021-05-13 DIAGNOSIS — M25561 Pain in right knee: Secondary | ICD-10-CM | POA: Diagnosis not present

## 2021-05-13 DIAGNOSIS — M25562 Pain in left knee: Secondary | ICD-10-CM

## 2021-05-13 DIAGNOSIS — R600 Localized edema: Secondary | ICD-10-CM | POA: Diagnosis not present

## 2021-05-13 DIAGNOSIS — E039 Hypothyroidism, unspecified: Secondary | ICD-10-CM | POA: Diagnosis not present

## 2021-05-13 DIAGNOSIS — G8929 Other chronic pain: Secondary | ICD-10-CM

## 2021-05-13 DIAGNOSIS — Z7901 Long term (current) use of anticoagulants: Secondary | ICD-10-CM

## 2021-05-13 DIAGNOSIS — Z6836 Body mass index (BMI) 36.0-36.9, adult: Secondary | ICD-10-CM

## 2021-05-13 DIAGNOSIS — M25551 Pain in right hip: Secondary | ICD-10-CM

## 2021-05-13 LAB — CBC WITH DIFFERENTIAL/PLATELET
Basophils Absolute: 0.1 10*3/uL (ref 0.0–0.1)
Basophils Relative: 0.6 % (ref 0.0–3.0)
Eosinophils Absolute: 0.1 10*3/uL (ref 0.0–0.7)
Eosinophils Relative: 1.1 % (ref 0.0–5.0)
HCT: 41.7 % (ref 39.0–52.0)
Hemoglobin: 14.4 g/dL (ref 13.0–17.0)
Lymphocytes Relative: 19.3 % (ref 12.0–46.0)
Lymphs Abs: 1.8 10*3/uL (ref 0.7–4.0)
MCHC: 34.5 g/dL (ref 30.0–36.0)
MCV: 97.6 fl (ref 78.0–100.0)
Monocytes Absolute: 0.7 10*3/uL (ref 0.1–1.0)
Monocytes Relative: 7 % (ref 3.0–12.0)
Neutro Abs: 6.8 10*3/uL (ref 1.4–7.7)
Neutrophils Relative %: 72 % (ref 43.0–77.0)
Platelets: 366 10*3/uL (ref 150.0–400.0)
RBC: 4.27 Mil/uL (ref 4.22–5.81)
RDW: 13 % (ref 11.5–15.5)
WBC: 9.4 10*3/uL (ref 4.0–10.5)

## 2021-05-13 LAB — T4, FREE: Free T4: 0.9 ng/dL (ref 0.60–1.60)

## 2021-05-13 LAB — COMPREHENSIVE METABOLIC PANEL
ALT: 15 U/L (ref 0–53)
AST: 17 U/L (ref 0–37)
Albumin: 4.3 g/dL (ref 3.5–5.2)
Alkaline Phosphatase: 70 U/L (ref 39–117)
BUN: 18 mg/dL (ref 6–23)
CO2: 26 mEq/L (ref 19–32)
Calcium: 9.4 mg/dL (ref 8.4–10.5)
Chloride: 102 mEq/L (ref 96–112)
Creatinine, Ser: 1.07 mg/dL (ref 0.40–1.50)
GFR: 70.18 mL/min (ref >60.00)
Glucose, Bld: 93 mg/dL (ref 70–99)
Potassium: 4.1 mEq/L (ref 3.5–5.1)
Sodium: 138 mEq/L (ref 135–145)
Total Bilirubin: 0.6 mg/dL (ref 0.2–1.2)
Total Protein: 7.1 g/dL (ref 6.0–8.3)

## 2021-05-13 LAB — PROTIME-INR
INR: 1.8 ratio — ABNORMAL HIGH (ref 0.8–1.0)
Prothrombin Time: 18.7 s — ABNORMAL HIGH (ref 9.6–13.1)

## 2021-05-13 LAB — TSH: TSH: 1.43 u[IU]/mL (ref 0.35–5.50)

## 2021-05-13 MED ORDER — ALPRAZOLAM 1 MG PO TABS: ORAL_TABLET | ORAL | 1 refills | Status: DC

## 2021-05-13 MED ORDER — FAMOTIDINE 40 MG PO TABS: 40.0000 mg | ORAL_TABLET | Freq: Every day | ORAL | 5 refills | Status: DC

## 2021-05-13 MED ORDER — LEVOTHYROXINE SODIUM 25 MCG PO TABS
25.0000 ug | ORAL_TABLET | Freq: Every day | ORAL | 3 refills | Status: DC
Start: 2021-05-13 — End: 2021-05-20

## 2021-05-13 NOTE — Assessment & Plan Note (Signed)
Cont w/wt loss 

## 2021-05-13 NOTE — Assessment & Plan Note (Signed)
On Xarelto, off Coumadin ? Potential benefits of a long term Coumadin  use as well as potential risks  and complications were explained to the patient and were aknowledged. ?

## 2021-05-13 NOTE — Assessment & Plan Note (Signed)
Blue-Emu cream was recommended to use 2-3 times a day ? ?

## 2021-05-13 NOTE — Assessment & Plan Note (Signed)
R DVT ?Off Coumadin 2023; On Xarelto ?Potential benefits of a long term coumadin/Xarelto were explained to the patient and were aknowledged. ?Joseph [name] had another blood clot in the R leg after colonoscopy 6/22.  On Coumadin.  We discussed Xarelto and Eliquis options with the patient ?

## 2021-05-13 NOTE — Assessment & Plan Note (Signed)
RLE - post-DVT ?Cont w/a compression sock ?

## 2021-05-13 NOTE — Assessment & Plan Note (Signed)
On Levoxil ?

## 2021-05-13 NOTE — Assessment & Plan Note (Signed)
Mild ?Try Pepcid ?

## 2021-05-13 NOTE — Patient Instructions (Signed)
Blue-Emu cream -- use 2-3 times a day ? ?

## 2021-05-13 NOTE — Assessment & Plan Note (Signed)
Much better on diet - lost 35 lbs fasting ? ?

## 2021-05-13 NOTE — Progress Notes (Signed)
? ?Subjective:  ?Patient ID: Joseph Joseph [name], male    DOB: 09-25-1950  Age: 71 y.o. MRN: [id number] ? ?CC: Follow-up ? ? ?HPI ?Joseph Joseph [name] presents for wt loss - 33 lbs ?H/o RLE DVT, hypothyroidism, anxiety ? ?Outpatient Medications Prior to Visit  ?Medication Sig Dispense Refill  ? Cholecalciferol (VITAMIN D3) 50 MCG (2000 UT) capsule Take 1 capsule (2,000 Units total) by mouth daily. 100 capsule 3  ? rivaroxaban (XARELTO) 20 MG TABS tablet Take 1 tablet (20 mg total) by mouth daily. 30 tablet 11  ? sildenafil (VIAGRA) 100 MG tablet Take 1 tablet (100 mg total) by mouth as needed for erectile dysfunction. 100 tablet 1  ? ALPRAZolam (XANAX) 1 MG tablet TAKE 1/2 TO 1 TABLET BY MOUTH TWO TIMES A DAY AS NEEDED FOR ANXIETY 180 tablet 1  ? levothyroxine (SYNTHROID) 25 MCG tablet TAKE ONE TABLET BY MOUTH DAILY 90 tablet 3  ? Semaglutide, 1 MG/DOSE, 4 MG/3ML SOPN Inject 1 mg as directed once a week. 3 mL 3  ? ?No facility-administered medications prior to visit.  ? ? ?ROS: ?Review of Systems  ?Constitutional:  Negative for appetite change, fatigue and unexpected weight change.  ?HENT:  Negative for congestion, nosebleeds, sneezing, sore throat and trouble swallowing.   ?Eyes:  Negative for itching and visual disturbance.  ?Respiratory:  Negative for cough.   ?Cardiovascular:  Negative for chest pain, palpitations and leg swelling.  ?Gastrointestinal:  Negative for abdominal distention, Joseph [name] in stool, diarrhea and nausea.  ?Genitourinary:  Negative for frequency and hematuria.  ?Musculoskeletal:  Negative for back pain, gait problem, joint swelling and neck pain.  ?Skin:  Negative for rash.  ?Neurological:  Negative for dizziness, tremors, speech difficulty and weakness.  ?Psychiatric/Behavioral:  Negative for agitation, dysphoric mood, sleep disturbance and suicidal ideas. The patient is not nervous/anxious.   ? ?Objective:  ?BP 132/84 (BP Location: Left Arm, Patient Position: Sitting, Cuff Size: Large)   Pulse (!)  118   Temp 98.9 ?F (37.2 ?C) (Oral)   Ht '6\' 1"'$  (1.854 m)   Wt 267 lb (121.1 kg)   SpO2 93%   BMI 35.23 kg/m?  ? ?BP Readings from Last 3 Encounters:  ?05/13/21 132/84  ?01/02/21 124/76  ?09/02/20 (!) 148/90  ? ? ?Wt Readings from Last 3 Encounters:  ?05/13/21 267 lb (121.1 kg)  ?01/02/21 276 lb 6.4 oz (125.4 kg)  ?09/02/20 297 lb 12.8 oz (135.1 kg)  ? ? ?Physical Exam ?Constitutional:   ?   General: He is not in acute distress. ?   Appearance: He is well-developed. He is obese.  ?   Comments: NAD  ?Eyes:  ?   Conjunctiva/sclera: Conjunctivae normal.  ?   Pupils: Pupils are equal, round, and reactive to light.  ?Neck:  ?   Thyroid: No thyromegaly.  ?   Vascular: No JVD.  ?Cardiovascular:  ?   Rate and Rhythm: Normal rate and regular rhythm.  ?   Heart sounds: Normal heart sounds. No murmur heard. ?  No friction rub. No gallop.  ?Pulmonary:  ?   Effort: Pulmonary effort is normal. No respiratory distress.  ?   Breath sounds: Normal breath sounds. No wheezing or rales.  ?Chest:  ?   Chest wall: No tenderness.  ?Abdominal:  ?   General: Bowel sounds are normal. There is no distension.  ?   Palpations: Abdomen is soft. There is no mass.  ?   Tenderness: There is no abdominal tenderness. There is no  guarding or rebound.  ?Musculoskeletal:     ?   General: No tenderness. Normal range of motion.  ?   Cervical back: Normal range of motion.  ?Lymphadenopathy:  ?   Cervical: No cervical adenopathy.  ?Skin: ?   General: Skin is warm and dry.  ?   Findings: No rash.  ?Neurological:  ?   Mental Status: He is alert and oriented to person, place, and time.  ?   Cranial Nerves: No cranial nerve deficit.  ?   Motor: No abnormal muscle tone.  ?   Coordination: Coordination normal.  ?   Gait: Gait normal.  ?   Deep Tendon Reflexes: Reflexes are normal and symmetric.  ?Psychiatric:     ?   Behavior: Behavior normal.     ?   Thought Content: Thought content normal.     ?   Judgment: Judgment normal.  ?RLE w/swelling ? ? ?Lab Results   ?Component Value Date  ? WBC 9.5 05/06/2020  ? HGB 14.9 05/06/2020  ? HCT 43.4 05/06/2020  ? PLT 363.0 05/06/2020  ? GLUCOSE 91 01/02/2021  ? CHOL 142 02/07/2020  ? TRIG 96.0 02/07/2020  ? HDL 59.30 02/07/2020  ? [place] 64 02/07/2020  ? ALT 13 01/02/2021  ? AST 13 01/02/2021  ? NA 138 01/02/2021  ? K 4.5 01/02/2021  ? CL 104 01/02/2021  ? CREATININE 1.07 01/02/2021  ? BUN 15 01/02/2021  ? CO2 27 01/02/2021  ? TSH 1.23 01/02/2021  ? PSA 0.47 02/07/2020  ? INR 3.0 (H) 01/02/2021  ? HGBA1C 6.1 01/02/2021  ? ? ?No results found. ? ?Assessment & Plan:  ? ?Problem List Items Addressed This Visit   ? ? Hypothyroidism  ?  On Levoxil ? ?  ?  ? Relevant Medications  ? levothyroxine (SYNTHROID) 25 MCG tablet  ? Other Relevant Orders  ? TSH  ? T4, free  ? Comprehensive metabolic panel  ? CBC with Differential/Platelet  ? Obesity  ?  Much better on diet - lost 35 lbs fasting ? ? ?  ?  ? Osteoarthritis  ?  Cont w/wt loss ? ?  ?  ? Edema  ?  RLE - post-DVT ?Cont w/a compression sock ?  ?  ? DVT, HX OF  ?  On Xarelto, off Coumadin ? Potential benefits of a long term Coumadin  use as well as potential risks  and complications were explained to the patient and were aknowledged. ?  ?  ? Relevant Orders  ? Protime-INR  ? Long term current use of anticoagulant therapy  ?  R DVT ?Off Coumadin 2023; On Xarelto ?Potential benefits of a long term coumadin/Xarelto were explained to the patient and were aknowledged. ?Joseph Joseph [name] had another Joseph [name] clot in the R leg after colonoscopy 6/22.  On Coumadin.  We discussed Xarelto and Eliquis options with the patient ?  ?  ? Hip pain, chronic, right  ?  Blue-Emu cream was recommended to use 2-3 times a day ? ? ? ?  ?  ? GERD (gastroesophageal reflux disease)  ?  Mild ?Try Pepcid ? ?  ?  ? Relevant Medications  ? famotidine (PEPCID) 40 MG tablet  ? KNEE PAIN (Chronic)  ?  Blue-Emu cream was recommended to use 2-3 times a day ? ? ?  ?  ? Relevant Orders  ? CBC with Differential/Platelet  ?  ? ? ?Meds ordered  this encounter  ?Medications  ? ALPRAZolam (XANAX) 1 MG tablet  ?  Sig: TAKE 1/2 TO 1 TABLET BY MOUTH TWO TIMES A DAY AS NEEDED FOR ANXIETY  ?  Dispense:  180 tablet  ?  Refill:  1  ? levothyroxine (SYNTHROID) 25 MCG tablet  ?  Sig: Take 1 tablet (25 mcg total) by mouth daily.  ?  Dispense:  90 tablet  ?  Refill:  3  ? famotidine (PEPCID) 40 MG tablet  ?  Sig: Take 1 tablet (40 mg total) by mouth daily.  ?  Dispense:  30 tablet  ?  Refill:  5  ?  ? ? ?Follow-up: Return in about 4 months (around 09/12/2021) for a follow-up visit. ? ?[name], MD ?

## 2021-05-19 ENCOUNTER — Other Ambulatory Visit: Payer: Self-pay | Admitting: Internal Medicine

## 2021-05-21 ENCOUNTER — Other Ambulatory Visit: Payer: Self-pay | Admitting: Internal Medicine

## 2021-07-09 DIAGNOSIS — K5730 Diverticulosis of large intestine without perforation or abscess without bleeding: Secondary | ICD-10-CM | POA: Diagnosis not present

## 2021-07-09 DIAGNOSIS — Z86010 Personal history of colonic polyps: Secondary | ICD-10-CM | POA: Diagnosis not present

## 2021-07-09 DIAGNOSIS — K640 First degree hemorrhoids: Secondary | ICD-10-CM | POA: Diagnosis not present

## 2021-07-09 DIAGNOSIS — K635 Polyp of colon: Secondary | ICD-10-CM | POA: Diagnosis not present

## 2021-07-09 DIAGNOSIS — Z1211 Encounter for screening for malignant neoplasm of colon: Secondary | ICD-10-CM | POA: Diagnosis not present

## 2021-07-09 DIAGNOSIS — D123 Benign neoplasm of transverse colon: Secondary | ICD-10-CM | POA: Diagnosis not present

## 2021-07-09 DIAGNOSIS — D122 Benign neoplasm of ascending colon: Secondary | ICD-10-CM | POA: Diagnosis not present

## 2021-07-09 LAB — HM COLONOSCOPY

## 2021-07-15 ENCOUNTER — Encounter: Payer: Self-pay | Admitting: Internal Medicine

## 2021-07-24 DIAGNOSIS — I82409 Acute embolism and thrombosis of unspecified deep veins of unspecified lower extremity: Secondary | ICD-10-CM | POA: Insufficient documentation

## 2021-07-24 DIAGNOSIS — M1611 Unilateral primary osteoarthritis, right hip: Secondary | ICD-10-CM | POA: Diagnosis not present

## 2021-07-24 DIAGNOSIS — M1712 Unilateral primary osteoarthritis, left knee: Secondary | ICD-10-CM | POA: Diagnosis not present

## 2021-07-24 DIAGNOSIS — M1711 Unilateral primary osteoarthritis, right knee: Secondary | ICD-10-CM | POA: Diagnosis not present

## 2021-09-09 ENCOUNTER — Ambulatory Visit: Payer: Medicare Other | Admitting: Internal Medicine

## 2021-10-07 ENCOUNTER — Ambulatory Visit (INDEPENDENT_AMBULATORY_CARE_PROVIDER_SITE_OTHER): Payer: Medicare Other

## 2021-10-07 ENCOUNTER — Ambulatory Visit (INDEPENDENT_AMBULATORY_CARE_PROVIDER_SITE_OTHER): Payer: Medicare Other | Admitting: Internal Medicine

## 2021-10-07 ENCOUNTER — Encounter: Payer: Self-pay | Admitting: Internal Medicine

## 2021-10-07 VITALS — BP 138/82 | HR 56 | Temp 98.0°F | Ht 73.0 in | Wt 270.0 lb

## 2021-10-07 DIAGNOSIS — F419 Anxiety disorder, unspecified: Secondary | ICD-10-CM | POA: Diagnosis not present

## 2021-10-07 DIAGNOSIS — M546 Pain in thoracic spine: Secondary | ICD-10-CM

## 2021-10-07 DIAGNOSIS — M25562 Pain in left knee: Secondary | ICD-10-CM | POA: Diagnosis not present

## 2021-10-07 DIAGNOSIS — M25561 Pain in right knee: Secondary | ICD-10-CM

## 2021-10-07 DIAGNOSIS — Z23 Encounter for immunization: Secondary | ICD-10-CM | POA: Diagnosis not present

## 2021-10-07 MED ORDER — ALPRAZOLAM 1 MG PO TABS: ORAL_TABLET | ORAL | 1 refills | Status: DC

## 2021-10-07 NOTE — Progress Notes (Signed)
Subjective:  Patient ID: Joseph [name], male    DOB: 04/26/1950  Age: 71 y.o. MRN: [id number]  CC: Back Pain (Mid-upper back pain, only happen at night- Flu shot)   HPI  RIAD WAGLEY presents for night time pain between shoulder blade during the night when moving x 5 weeks - better  now. Pain was 9/10 at the worst. No injury. F/u on anxiety, hypothyroidism, DVT    Outpatient Medications Prior to Visit  Medication Sig Dispense Refill   Cholecalciferol (VITAMIN D3) 50 MCG (2000 UT) capsule Take 1 capsule (2,000 Units total) by mouth daily. 100 capsule 3   famotidine (PEPCID) 40 MG tablet Take 1 tablet (40 mg total) by mouth daily. 30 tablet 5   levothyroxine (SYNTHROID) 25 MCG tablet TAKE ONE TABLET BY MOUTH DAILY 90 tablet 3   rivaroxaban (XARELTO) 20 MG TABS tablet Take 1 tablet (20 mg total) by mouth daily. 30 tablet 11   sildenafil (VIAGRA) 100 MG tablet Take 1 tablet (100 mg total) by mouth as needed for erectile dysfunction. 100 tablet 1   ALPRAZolam (XANAX) 1 MG tablet TAKE 1/2 TO 1 TABLET BY MOUTH TWO TIMES A DAY AS NEEDED FOR ANXIETY 180 tablet 1   No facility-administered medications prior to visit.    ROS: Review of Systems  Constitutional:  Positive for unexpected weight change. Negative for appetite change and fatigue.  HENT:  Negative for congestion, nosebleeds, sneezing, sore throat and trouble swallowing.   Eyes:  Negative for itching and visual disturbance.  Respiratory:  Negative for cough.   Cardiovascular:  Negative for chest pain, palpitations and leg swelling.  Gastrointestinal:  Negative for abdominal distention, blood in stool, diarrhea and nausea.  Genitourinary:  Negative for frequency and hematuria.  Musculoskeletal:  Positive for back pain. Negative for gait problem, joint swelling and neck pain.  Skin:  Negative for rash.  Neurological:  Negative for dizziness, tremors, speech difficulty and weakness.  Psychiatric/Behavioral:  Negative for  agitation, dysphoric mood and sleep disturbance. The patient is not nervous/anxious.     Objective:  BP 138/82 (BP Location: Left Arm)   Pulse (!) 56   Temp 98 F (36.7 C) (Oral)   Ht '6\' 1"'$  (1.854 m)   Wt 270 lb (122.5 kg)   SpO2 97%   BMI 35.62 kg/m   BP Readings from Last 3 Encounters:  10/07/21 138/82  05/13/21 132/84  01/02/21 124/76    Wt Readings from Last 3 Encounters:  10/07/21 270 lb (122.5 kg)  05/13/21 267 lb (121.1 kg)  01/02/21 276 lb 6.4 oz (125.4 kg)    Physical Exam Constitutional:      General: He is not in acute distress.    Appearance: He is well-developed.     Comments: NAD  Eyes:     Conjunctiva/sclera: Conjunctivae normal.     Pupils: Pupils are equal, round, and reactive to light.  Neck:     Thyroid: No thyromegaly.     Vascular: No JVD.  Cardiovascular:     Rate and Rhythm: Normal rate and regular rhythm.     Heart sounds: Normal heart sounds. No murmur heard.    No friction rub. No gallop.  Pulmonary:     Effort: Pulmonary effort is normal. No respiratory distress.     Breath sounds: Normal breath sounds. No wheezing or rales.  Chest:     Chest wall: No tenderness.  Abdominal:     General: Bowel sounds are normal. There is no  distension.     Palpations: Abdomen is soft. There is no mass.     Tenderness: There is no abdominal tenderness. There is no guarding or rebound.  Musculoskeletal:        General: No tenderness. Normal range of motion.     Cervical back: Normal range of motion.  Lymphadenopathy:     Cervical: No cervical adenopathy.  Skin:    General: Skin is warm and dry.     Findings: No rash.  Neurological:     Mental Status: He is alert and oriented to person, place, and time.     Cranial Nerves: No cranial nerve deficit.     Motor: No abnormal muscle tone.     Coordination: Coordination normal.     Gait: Gait normal.     Deep Tendon Reflexes: Reflexes are normal and symmetric.  Psychiatric:        Behavior: Behavior  normal.        Thought Content: Thought content normal.        Judgment: Judgment normal.   Central spine is sensitive between shoulder blades to deep palpation  Lab Results  Component Value Date   WBC 9.4 05/13/2021   HGB 14.4 05/13/2021   HCT 41.7 05/13/2021   PLT 366.0 05/13/2021   GLUCOSE 93 05/13/2021   CHOL 142 02/07/2020   TRIG 96.0 02/07/2020   HDL 59.30 02/07/2020   LDLCALC 64 02/07/2020   ALT 15 05/13/2021   AST 17 05/13/2021   NA 138 05/13/2021   K 4.1 05/13/2021   CL 102 05/13/2021   CREATININE 1.07 05/13/2021   BUN 18 05/13/2021   CO2 26 05/13/2021   TSH 1.43 05/13/2021   PSA 0.47 02/07/2020   INR 1.8 (H) 05/13/2021   HGBA1C 6.1 01/02/2021    No results found.  Assessment & Plan:   Problem List Items Addressed This Visit     Anxiety disorder    Chronic Cont on Xanax 1 mg twice daily prn  Potential benefits of a long term benzodiazepines  use as well as potential risks  and complications were explained to the patient and were aknowledged.      Relevant Medications   ALPRAZolam (XANAX) 1 MG tablet   KNEE PAIN (Chronic)    Surgery is due in Dec 2023      Thoracic back pain    x5 weeks MSK Better now Will get thor spine X ray      Relevant Orders   DG Thoracic Spine 2 View   Other Visit Diagnoses     Needs flu shot    -  Primary   Relevant Orders   Flu Vaccine QUAD High Dose(Fluad) (Completed)         Meds ordered this encounter  Medications   ALPRAZolam (XANAX) 1 MG tablet    Sig: TAKE 1/2 TO 1 TABLET BY MOUTH TWO TIMES A DAY AS NEEDED FOR ANXIETY    Dispense:  180 tablet    Refill:  1      Follow-up: Return in about 4 months (around 02/06/2022) for a follow-up visit.  [name], MD

## 2021-10-07 NOTE — Assessment & Plan Note (Signed)
Surgery is due in Dec 2023

## 2021-10-07 NOTE — Assessment & Plan Note (Signed)
x5 weeks MSK Better now Will get thor spine X ray

## 2021-10-07 NOTE — Assessment & Plan Note (Signed)
Chronic Cont on Xanax 1 mg twice daily prn  Potential benefits of a long term benzodiazepines  use as well as potential risks  and complications were explained to the patient and were aknowledged.

## 2021-11-05 ENCOUNTER — Telehealth: Payer: Self-pay | Admitting: Internal Medicine

## 2021-11-05 NOTE — Telephone Encounter (Signed)
Left message for patient to call back and schedule Medicare Annual Wellness Visit (AWV).   Please offer to do virtually or by telephone.  Left office number and my jabber [phone].  Last AWV:04/09/2020  Please schedule at anytime with Nurse Health Advisor.

## 2021-11-22 ENCOUNTER — Other Ambulatory Visit: Payer: Self-pay | Admitting: Internal Medicine

## 2022-01-15 ENCOUNTER — Other Ambulatory Visit: Payer: Self-pay | Admitting: Internal Medicine

## 2022-01-16 DIAGNOSIS — Z01818 Encounter for other preprocedural examination: Secondary | ICD-10-CM | POA: Diagnosis not present

## 2022-01-19 HISTORY — PX: TOTAL HIP ARTHROPLASTY: SHX124

## 2022-01-25 ENCOUNTER — Other Ambulatory Visit: Payer: Self-pay | Admitting: Internal Medicine

## 2022-01-26 DIAGNOSIS — Z96641 Presence of right artificial hip joint: Secondary | ICD-10-CM | POA: Diagnosis not present

## 2022-01-26 DIAGNOSIS — Z471 Aftercare following joint replacement surgery: Secondary | ICD-10-CM | POA: Diagnosis not present

## 2022-01-26 DIAGNOSIS — Z6834 Body mass index (BMI) 34.0-34.9, adult: Secondary | ICD-10-CM | POA: Diagnosis not present

## 2022-01-26 DIAGNOSIS — M1611 Unilateral primary osteoarthritis, right hip: Secondary | ICD-10-CM | POA: Diagnosis not present

## 2022-01-26 DIAGNOSIS — Z7901 Long term (current) use of anticoagulants: Secondary | ICD-10-CM | POA: Diagnosis not present

## 2022-01-26 DIAGNOSIS — E669 Obesity, unspecified: Secondary | ICD-10-CM | POA: Diagnosis not present

## 2022-01-26 DIAGNOSIS — E039 Hypothyroidism, unspecified: Secondary | ICD-10-CM | POA: Diagnosis not present

## 2022-01-26 DIAGNOSIS — K219 Gastro-esophageal reflux disease without esophagitis: Secondary | ICD-10-CM | POA: Diagnosis not present

## 2022-01-26 DIAGNOSIS — Z86718 Personal history of other venous thrombosis and embolism: Secondary | ICD-10-CM | POA: Diagnosis not present

## 2022-01-26 DIAGNOSIS — Z79899 Other long term (current) drug therapy: Secondary | ICD-10-CM | POA: Diagnosis not present

## 2022-01-26 DIAGNOSIS — Z79890 Hormone replacement therapy (postmenopausal): Secondary | ICD-10-CM | POA: Diagnosis not present

## 2022-01-27 ENCOUNTER — Encounter: Payer: Self-pay | Admitting: Internal Medicine

## 2022-01-27 DIAGNOSIS — M1611 Unilateral primary osteoarthritis, right hip: Secondary | ICD-10-CM | POA: Diagnosis not present

## 2022-01-27 DIAGNOSIS — Z96641 Presence of right artificial hip joint: Secondary | ICD-10-CM | POA: Diagnosis not present

## 2022-02-10 DIAGNOSIS — Z96641 Presence of right artificial hip joint: Secondary | ICD-10-CM | POA: Diagnosis not present

## 2022-03-04 ENCOUNTER — Encounter: Payer: Self-pay | Admitting: Internal Medicine

## 2022-03-04 MED ORDER — SILDENAFIL CITRATE 100 MG PO TABS: 100.0000 mg | ORAL_TABLET | ORAL | 1 refills | Status: DC | PRN

## 2022-03-18 ENCOUNTER — Telehealth: Payer: Self-pay

## 2022-03-18 NOTE — Telephone Encounter (Signed)
Called patient to schedule Medicare Annual Wellness Visit (AWV). Unable to reach patient.  Last date of AWV: 04/09/20  Please schedule an appointment at any time with NHA or [facility].    Norton Blizzard, Hugo (AAMA)  [facility] [phone]

## 2022-03-19 DIAGNOSIS — M25551 Pain in right hip: Secondary | ICD-10-CM | POA: Diagnosis not present

## 2022-03-19 DIAGNOSIS — Z4789 Encounter for other orthopedic aftercare: Secondary | ICD-10-CM | POA: Diagnosis not present

## 2022-04-02 DIAGNOSIS — M25551 Pain in right hip: Secondary | ICD-10-CM | POA: Diagnosis not present

## 2022-04-10 DIAGNOSIS — M25551 Pain in right hip: Secondary | ICD-10-CM | POA: Diagnosis not present

## 2022-04-10 DIAGNOSIS — Z4789 Encounter for other orthopedic aftercare: Secondary | ICD-10-CM | POA: Diagnosis not present

## 2022-04-16 DIAGNOSIS — M25551 Pain in right hip: Secondary | ICD-10-CM | POA: Diagnosis not present

## 2022-04-16 DIAGNOSIS — Z4789 Encounter for other orthopedic aftercare: Secondary | ICD-10-CM | POA: Diagnosis not present

## 2022-04-20 IMAGING — DX DG LUMBAR SPINE COMPLETE 4+V
5 series · 5 of 5 positions shown · non-contrast
Comparison: None.

CLINICAL DATA: Lumbosacral pain. Right sciatica symptoms. Chronic
pain.

EXAM:
LUMBAR SPINE - COMPLETE 4+ VIEW

[l-spine ap]
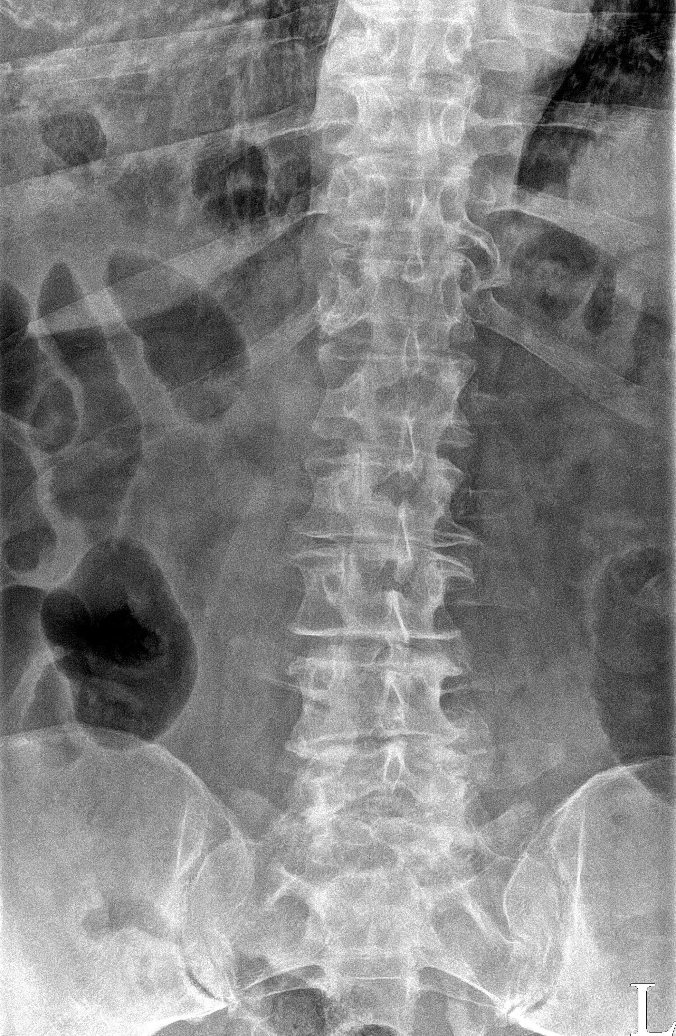

[l-spine obl (1 of 2)]
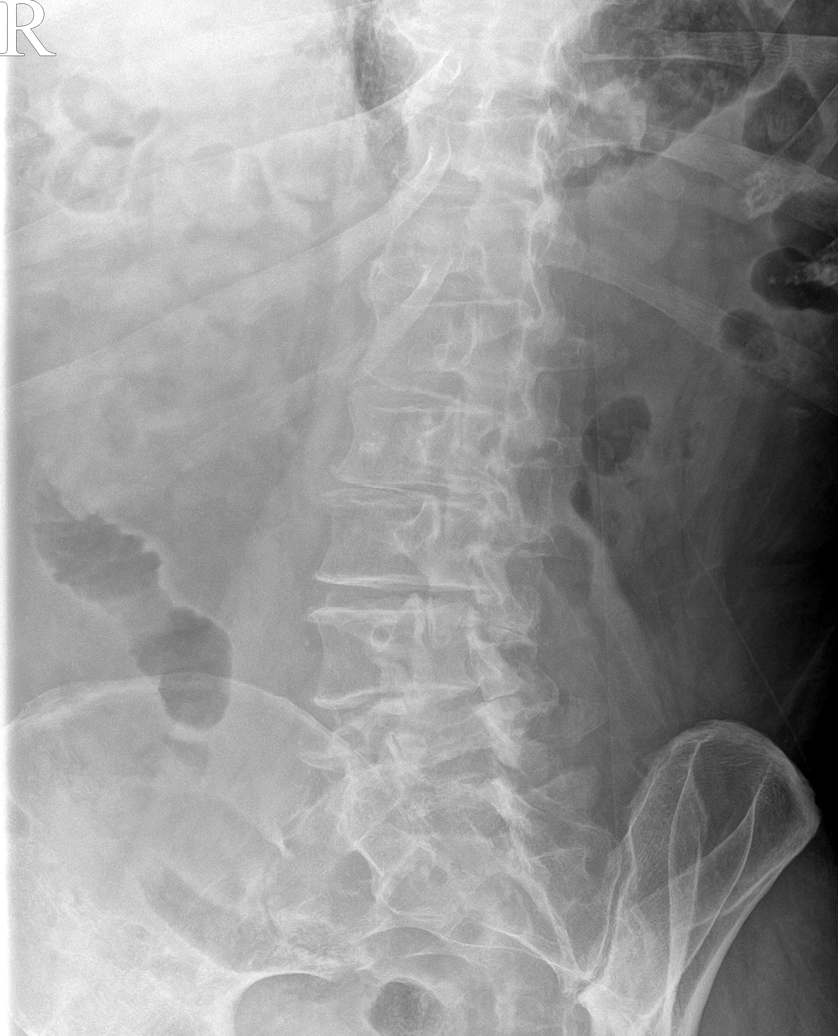

[l-spine obl (2 of 2)]
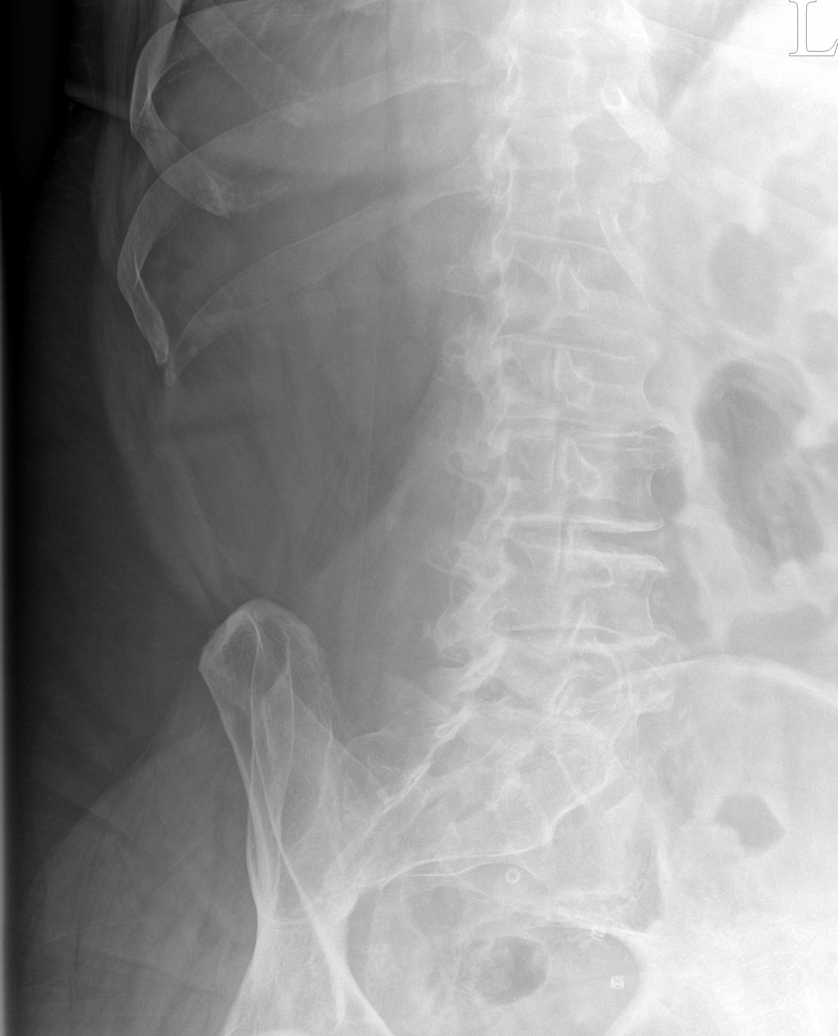

[l-spine lateral]
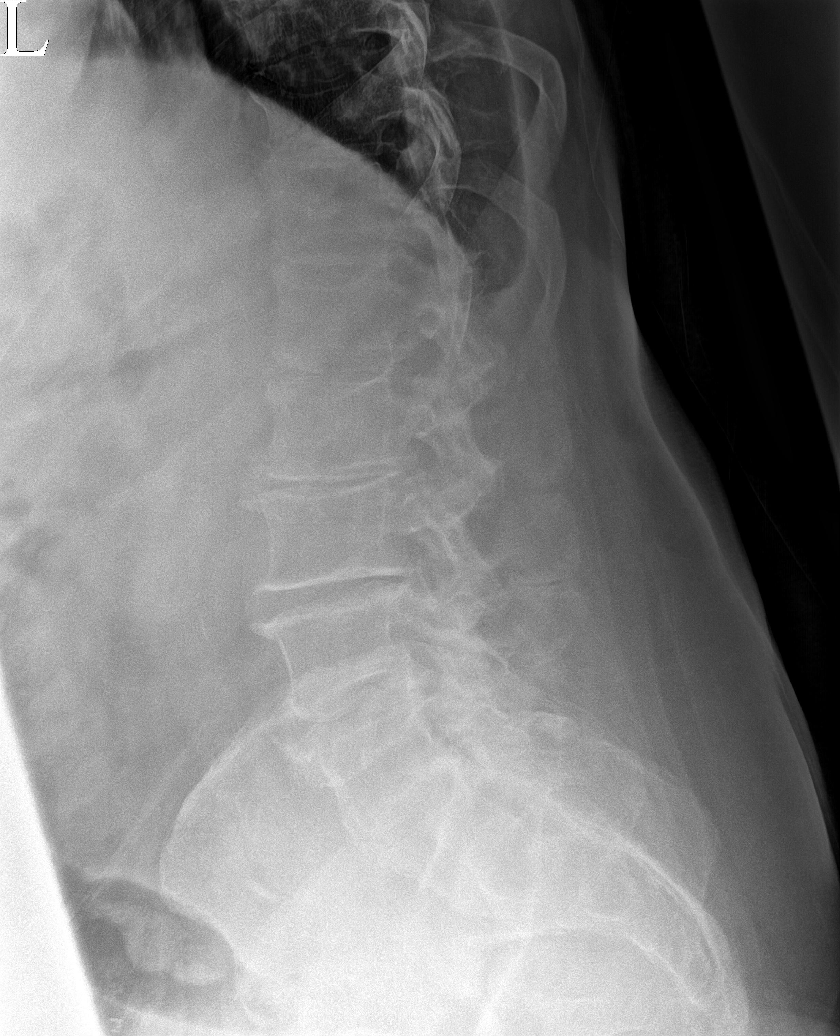

[l-spine spot]
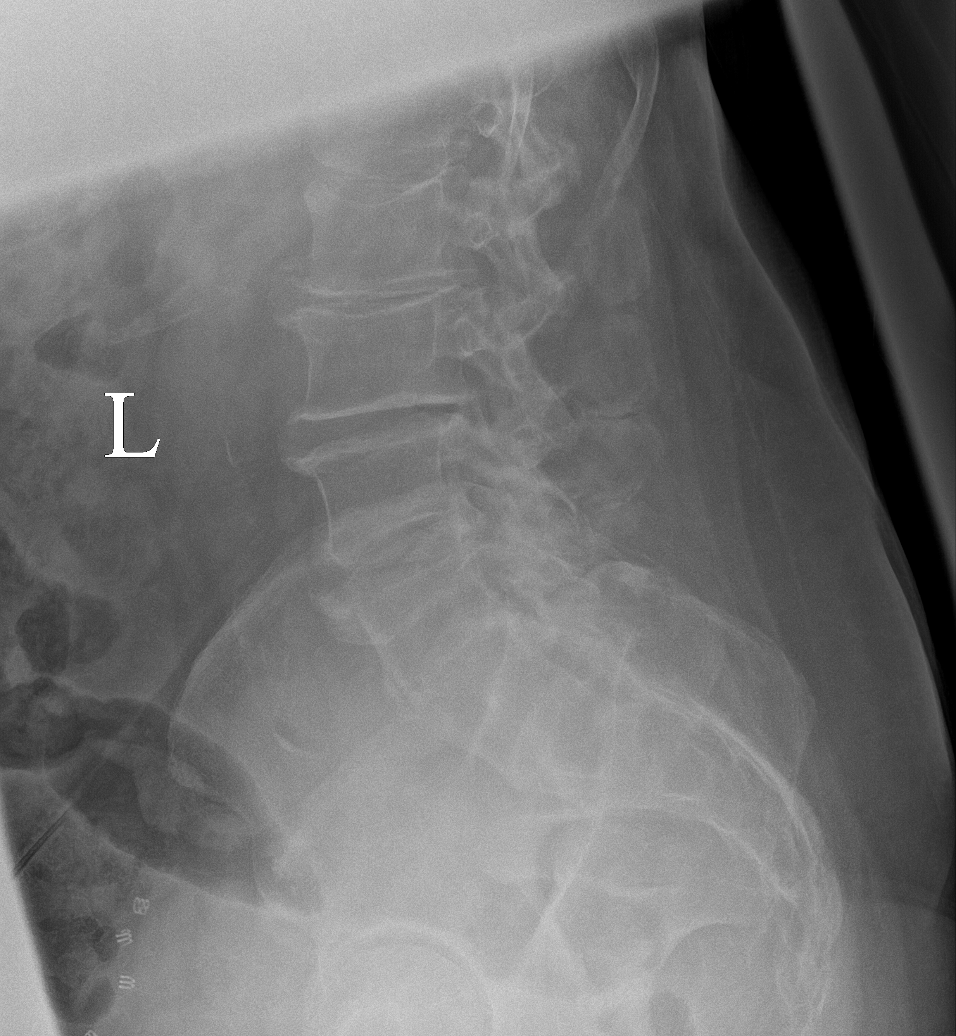

[5 of 5 positions shown; findings below may reference images not displayed]

FINDINGS: Five lumbar type vertebral bodies. Normal alignment. Diffuse
degenerative change with narrowed interspaces and prominent endplate
hypertrophic changes throughout. Degenerative changes in the facet
joints. No vertebral compression deformities. No focal bone lesion
or bone destruction. Vascular calcifications. Visualized sacrum
appears intact.
IMPRESSION: Diffuse degenerative changes throughout the lumbar spine. No acute
displaced fractures identified.

## 2022-04-23 DIAGNOSIS — M25551 Pain in right hip: Secondary | ICD-10-CM | POA: Diagnosis not present

## 2022-04-23 DIAGNOSIS — Z4789 Encounter for other orthopedic aftercare: Secondary | ICD-10-CM | POA: Diagnosis not present

## 2022-05-07 DIAGNOSIS — M25551 Pain in right hip: Secondary | ICD-10-CM | POA: Diagnosis not present

## 2022-05-14 DIAGNOSIS — Z4789 Encounter for other orthopedic aftercare: Secondary | ICD-10-CM | POA: Diagnosis not present

## 2022-05-14 DIAGNOSIS — M25551 Pain in right hip: Secondary | ICD-10-CM | POA: Diagnosis not present

## 2022-07-15 ENCOUNTER — Ambulatory Visit (INDEPENDENT_AMBULATORY_CARE_PROVIDER_SITE_OTHER): Payer: Medicare Other

## 2022-07-15 ENCOUNTER — Encounter: Payer: Self-pay | Admitting: Internal Medicine

## 2022-07-15 ENCOUNTER — Ambulatory Visit (INDEPENDENT_AMBULATORY_CARE_PROVIDER_SITE_OTHER): Payer: Medicare Other | Admitting: Internal Medicine

## 2022-07-15 VITALS — BP 120/78 | HR 84 | Temp 97.8°F | Ht 73.0 in | Wt 270.0 lb

## 2022-07-15 DIAGNOSIS — M9689 Other intraoperative and postprocedural complications and disorders of the musculoskeletal system: Secondary | ICD-10-CM | POA: Diagnosis not present

## 2022-07-15 DIAGNOSIS — F419 Anxiety disorder, unspecified: Secondary | ICD-10-CM

## 2022-07-15 DIAGNOSIS — G8929 Other chronic pain: Secondary | ICD-10-CM

## 2022-07-15 DIAGNOSIS — R5381 Other malaise: Secondary | ICD-10-CM

## 2022-07-15 DIAGNOSIS — Z13220 Encounter for screening for lipoid disorders: Secondary | ICD-10-CM | POA: Diagnosis not present

## 2022-07-15 DIAGNOSIS — M5442 Lumbago with sciatica, left side: Secondary | ICD-10-CM

## 2022-07-15 DIAGNOSIS — M438X6 Other specified deforming dorsopathies, lumbar region: Secondary | ICD-10-CM | POA: Diagnosis not present

## 2022-07-15 DIAGNOSIS — M47816 Spondylosis without myelopathy or radiculopathy, lumbar region: Secondary | ICD-10-CM | POA: Diagnosis not present

## 2022-07-15 DIAGNOSIS — M5441 Lumbago with sciatica, right side: Secondary | ICD-10-CM | POA: Diagnosis not present

## 2022-07-15 DIAGNOSIS — E039 Hypothyroidism, unspecified: Secondary | ICD-10-CM

## 2022-07-15 DIAGNOSIS — M5136 Other intervertebral disc degeneration, lumbar region: Secondary | ICD-10-CM | POA: Diagnosis not present

## 2022-07-15 DIAGNOSIS — M419 Scoliosis, unspecified: Secondary | ICD-10-CM

## 2022-07-15 DIAGNOSIS — Z96641 Presence of right artificial hip joint: Secondary | ICD-10-CM | POA: Diagnosis not present

## 2022-07-15 LAB — COMPREHENSIVE METABOLIC PANEL
ALT: 12 U/L (ref 0–53)
AST: 17 U/L (ref 0–37)
Albumin: 4.1 g/dL (ref 3.5–5.2)
Alkaline Phosphatase: 63 U/L (ref 39–117)
BUN: 17 mg/dL (ref 6–23)
CO2: 25 mEq/L (ref 19–32)
Calcium: 9.5 mg/dL (ref 8.4–10.5)
Chloride: 103 mEq/L (ref 96–112)
Creatinine, Ser: 1.16 mg/dL (ref 0.40–1.50)
GFR: 63.18 mL/min (ref >60.00)
Glucose, Bld: 102 mg/dL — ABNORMAL HIGH (ref 70–99)
Potassium: 4 mEq/L (ref 3.5–5.1)
Sodium: 135 mEq/L (ref 135–145)
Total Bilirubin: 0.6 mg/dL (ref 0.2–1.2)
Total Protein: 6.9 g/dL (ref 6.0–8.3)

## 2022-07-15 LAB — CBC WITH DIFFERENTIAL/PLATELET
Basophils Absolute: 0 10*3/uL (ref 0.0–0.1)
Basophils Relative: 0.5 % (ref 0.0–3.0)
Eosinophils Absolute: 0.2 10*3/uL (ref 0.0–0.7)
Eosinophils Relative: 1.9 % (ref 0.0–5.0)
HCT: 41.2 % (ref 39.0–52.0)
Hemoglobin: 13.6 g/dL (ref 13.0–17.0)
Lymphocytes Relative: 21.8 % (ref 12.0–46.0)
Lymphs Abs: 2.1 10*3/uL (ref 0.7–4.0)
MCHC: 33.1 g/dL (ref 30.0–36.0)
MCV: 99.3 fl (ref 78.0–100.0)
Monocytes Absolute: 0.7 10*3/uL (ref 0.1–1.0)
Monocytes Relative: 7 % (ref 3.0–12.0)
Neutro Abs: 6.7 10*3/uL (ref 1.4–7.7)
Neutrophils Relative %: 68.8 % (ref 43.0–77.0)
Platelets: 394 10*3/uL (ref 150.0–400.0)
RBC: 4.15 Mil/uL — ABNORMAL LOW (ref 4.22–5.81)
RDW: 13.1 % (ref 11.5–15.5)
WBC: 9.8 10*3/uL (ref 4.0–10.5)

## 2022-07-15 LAB — URINALYSIS
Bilirubin Urine: NEGATIVE
Hgb urine dipstick: NEGATIVE
Ketones, ur: NEGATIVE
Leukocytes,Ua: NEGATIVE
Nitrite: NEGATIVE
Specific Gravity, Urine: 1.02 (ref 1.000–1.030)
Total Protein, Urine: NEGATIVE
Urine Glucose: NEGATIVE
Urobilinogen, UA: 0.2 (ref 0.0–1.0)
pH: 6 (ref 5.0–8.0)

## 2022-07-15 LAB — LIPID PANEL
Cholesterol: 132 mg/dL (ref 0–200)
HDL: 52.5 mg/dL (ref >39.00)
LDL Cholesterol: 59 mg/dL (ref 0–99)
NonHDL: 79.64
Total CHOL/HDL Ratio: 3
Triglycerides: 104 mg/dL (ref 0.0–149.0)
VLDL: 20.8 mg/dL (ref 0.0–40.0)

## 2022-07-15 LAB — TSH: TSH: 1.17 u[IU]/mL (ref 0.35–5.50)

## 2022-07-15 MED ORDER — RIVAROXABAN 20 MG PO TABS: 20.0000 mg | ORAL_TABLET | Freq: Every day | ORAL | 3 refills | Status: DC

## 2022-07-15 MED ORDER — SILDENAFIL CITRATE 100 MG PO TABS: 100.0000 mg | ORAL_TABLET | ORAL | 1 refills | Status: DC | PRN

## 2022-07-15 MED ORDER — ALPRAZOLAM 1 MG PO TABS: ORAL_TABLET | ORAL | 1 refills | Status: DC

## 2022-07-15 MED ORDER — CYCLOBENZAPRINE HCL 5 MG PO TABS: 5.0000 mg | ORAL_TABLET | Freq: Three times a day (TID) | ORAL | 1 refills | Status: AC | PRN

## 2022-07-15 NOTE — Assessment & Plan Note (Addendum)
X ray HOKA sneakers Chair yoga Walking poles  Flexeril prn

## 2022-07-15 NOTE — Progress Notes (Signed)
Subjective:  Patient ID: BRANDIE BILOTTA, male    DOB: Oct 08, 1950  Age: 72 y.o. MRN: [id number]  CC: Follow-up   HPI ANACLETO WEINERT presents for OA, anxiety, DVT He is s/p right total hip arthroplasty by Dr. Marlene Bast 01/26/2022. He has done well despite some left-sided back pain and left 5th toe issues.  L 5th toe s/p stress fx Nadine Counts went to physical therapy for several sessions C/o LBP  Outpatient Medications Prior to Visit  Medication Sig Dispense Refill   Cholecalciferol (VITAMIN D3) 50 MCG (2000 UT) capsule Take 1 capsule (2,000 Units total) by mouth daily. 100 capsule 3   famotidine (PEPCID) 40 MG tablet TAKE ONE TABLET BY MOUTH DAILY 60 tablet 5   levothyroxine (SYNTHROID) 25 MCG tablet TAKE ONE TABLET BY MOUTH DAILY 90 tablet 3   ALPRAZolam (XANAX) 1 MG tablet TAKE ONE HALF TO ONE TABLET BY MOUTH TWO TIMES A DAY AS NEEDED FOR ANXIETY 180 tablet 1   rivaroxaban (XARELTO) 20 MG TABS tablet TAKE ONE TABLET BY MOUTH DAILY 30 tablet 5   sildenafil (VIAGRA) 100 MG tablet Take 1 tablet (100 mg total) by mouth as needed for erectile dysfunction. 100 tablet 1   No facility-administered medications prior to visit.    ROS: Review of Systems  Constitutional:  Negative for appetite change, fatigue and unexpected weight change.  HENT:  Negative for congestion, nosebleeds, sneezing, sore throat and trouble swallowing.   Eyes:  Negative for itching and visual disturbance.  Respiratory:  Negative for cough.   Cardiovascular:  Negative for chest pain, palpitations and leg swelling.  Gastrointestinal:  Negative for abdominal distention, blood in stool, diarrhea and nausea.  Genitourinary:  Negative for frequency and hematuria.  Musculoskeletal:  Positive for arthralgias and gait problem. Negative for back pain, joint swelling and neck pain.  Skin:  Negative for rash.  Neurological:  Negative for dizziness, tremors, speech difficulty and weakness.  Psychiatric/Behavioral:  Negative for  agitation, dysphoric mood and sleep disturbance. The patient is not nervous/anxious.     Objective:  BP 120/78 (BP Location: Left Arm, Patient Position: Sitting, Cuff Size: Large)   Pulse 84   Temp 97.8 F (36.6 C) (Oral)   Ht 6\' 1"  (1.854 m)   Wt 270 lb (122.5 kg)   SpO2 95%   BMI 35.62 kg/m   BP Readings from Last 3 Encounters:  07/15/22 120/78  10/07/21 138/82  05/13/21 132/84    Wt Readings from Last 3 Encounters:  07/15/22 270 lb (122.5 kg)  10/07/21 270 lb (122.5 kg)  05/13/21 267 lb (121.1 kg)    Physical Exam Constitutional:      General: He is not in acute distress.    Appearance: He is well-developed.     Comments: NAD  Eyes:     Conjunctiva/sclera: Conjunctivae normal.     Pupils: Pupils are equal, round, and reactive to light.  Neck:     Thyroid: No thyromegaly.     Vascular: No JVD.  Cardiovascular:     Rate and Rhythm: Normal rate and regular rhythm.     Heart sounds: Normal heart sounds. No murmur heard.    No friction rub. No gallop.  Pulmonary:     Effort: Pulmonary effort is normal. No respiratory distress.     Breath sounds: Normal breath sounds. No wheezing or rales.  Chest:     Chest wall: No tenderness.  Abdominal:     General: Bowel sounds are normal. There is no distension.  Palpations: Abdomen is soft. There is no mass.     Tenderness: There is no abdominal tenderness. There is no guarding or rebound.  Musculoskeletal:        General: Tenderness present. Normal range of motion.     Cervical back: Normal range of motion.     Right lower leg: Edema present.     Left lower leg: Edema present.  Lymphadenopathy:     Cervical: No cervical adenopathy.  Skin:    General: Skin is warm and dry.     Findings: No rash.  Neurological:     Mental Status: He is alert and oriented to person, place, and time.     Cranial Nerves: No cranial nerve deficit.     Motor: No abnormal muscle tone.     Coordination: Coordination normal.     Gait:  Gait normal.     Deep Tendon Reflexes: Reflexes are normal and symmetric.  Psychiatric:        Behavior: Behavior normal.        Thought Content: Thought content normal.        Judgment: Judgment normal.   Abnormal scoliotic posture Antalgic gait    A total time of 45 minutes was spent preparing to see the patient, reviewing tests, x-rays, operative reports and other medical records.  Also, obtaining history and performing comprehensive physical exam.  Additionally, counseling the patient regarding the above listed issues -gait disorder,.  scoliotic posture.   Finally, documenting clinical information in the health records, coordination of care, educating the patient. It is a complex case.  Lab Results  Component Value Date   WBC 9.8 07/15/2022   HGB 13.6 07/15/2022   HCT 41.2 07/15/2022   PLT 394.0 07/15/2022   GLUCOSE 102 (H) 07/15/2022   CHOL 132 07/15/2022   TRIG 104.0 07/15/2022   HDL 52.50 07/15/2022   LDLCALC 59 07/15/2022   ALT 12 07/15/2022   AST 17 07/15/2022   NA 135 07/15/2022   K 4.0 07/15/2022   CL 103 07/15/2022   CREATININE 1.16 07/15/2022   BUN 17 07/15/2022   CO2 25 07/15/2022   TSH 1.17 07/15/2022   PSA 0.47 02/07/2020   INR 1.8 (H) 05/13/2021   HGBA1C 6.1 01/02/2021    No results found.  Assessment & Plan:   Problem List Items Addressed This Visit     Hypothyroidism - Primary    Check labs      Relevant Orders   TSH (Completed)   CBC with Differential/Platelet (Completed)   Lipid panel (Completed)   Urinalysis (Completed)   Comprehensive metabolic panel (Completed)   Anxiety disorder    Chronic Cont on Xanax 1 mg twice daily prn  Potential benefits of a long term benzodiazepines  use as well as potential risks  and complications were explained to the patient and were aknowledged.      Relevant Medications   ALPRAZolam (XANAX) 1 MG tablet   Other Relevant Orders   TSH (Completed)   CBC with Differential/Platelet (Completed)   Lipid  panel (Completed)   Urinalysis (Completed)   Comprehensive metabolic panel (Completed)   Low back pain    X ray HOKA sneakers Chair yoga Walking poles  Flexeril prn      Relevant Medications   cyclobenzaprine (FLEXERIL) 5 MG tablet   Other Relevant Orders   DG Lumbar Spine 2-3 Views   CBC with Differential/Platelet (Completed)   Lipid panel (Completed)   Urinalysis (Completed)   Post-surgical scoliosis  New scoliosis - he will see a chiropractor OK to see a chiropractor      Relevant Orders   DG Lumbar Spine 2-3 Views   Physical deconditioning    New scoliosis - he will see a chiropractor OK to see a chiropractor LS X ray      Relevant Orders   DG Lumbar Spine 2-3 Views   TSH (Completed)   CBC with Differential/Platelet (Completed)   Lipid panel (Completed)   Urinalysis (Completed)   Comprehensive metabolic panel (Completed)      Meds ordered this encounter  Medications   ALPRAZolam (XANAX) 1 MG tablet    Sig: TAKE ONE HALF TO ONE TABLET BY MOUTH TWO TIMES A DAY AS NEEDED FOR ANXIETY    Dispense:  180 tablet    Refill:  1   rivaroxaban (XARELTO) 20 MG TABS tablet    Sig: Take 1 tablet (20 mg total) by mouth daily. TAKE ONE TABLET BY MOUTH DAILY    Dispense:  90 tablet    Refill:  3   cyclobenzaprine (FLEXERIL) 5 MG tablet    Sig: Take 1 tablet (5 mg total) by mouth 3 (three) times daily as needed for muscle spasms.    Dispense:  90 tablet    Refill:  1   sildenafil (VIAGRA) 100 MG tablet    Sig: Take 1 tablet (100 mg total) by mouth as needed for erectile dysfunction.    Dispense:  100 tablet    Refill:  1    [facility]--[phone]      Follow-up: Return in about 3 months (around 10/15/2022) for Wellness Exam.  Sonda Primes, MD

## 2022-07-15 NOTE — Assessment & Plan Note (Signed)
Check labs 

## 2022-07-15 NOTE — Patient Instructions (Addendum)
HOKA sneakers Chair yoga Walking poles

## 2022-07-15 NOTE — Assessment & Plan Note (Signed)
Chronic Cont on Xanax 1 mg twice daily prn  Potential benefits of a long term benzodiazepines  use as well as potential risks  and complications were explained to the patient and were aknowledged. 

## 2022-07-15 NOTE — Assessment & Plan Note (Signed)
New scoliosis - he will see a chiropractor OK to see a chiropractor LS X ray

## 2022-07-15 NOTE — Assessment & Plan Note (Signed)
New scoliosis - he will see a chiropractor OK to see a chiropractor

## 2022-07-22 ENCOUNTER — Telehealth: Payer: Self-pay | Admitting: Radiology

## 2022-07-22 NOTE — Telephone Encounter (Signed)
Left voice mail for patient to call back at [phone] to schedule Medicare Annual Wellness Visit    Last AWV:  04/09/2020   Please schedule Sequential/Initial AWV with [facility]. CMA

## 2022-08-06 DIAGNOSIS — K219 Gastro-esophageal reflux disease without esophagitis: Secondary | ICD-10-CM | POA: Diagnosis not present

## 2022-08-06 DIAGNOSIS — Z86010 Personal history of colonic polyps: Secondary | ICD-10-CM | POA: Diagnosis not present

## 2022-08-06 DIAGNOSIS — Z1211 Encounter for screening for malignant neoplasm of colon: Secondary | ICD-10-CM | POA: Diagnosis not present

## 2022-08-06 DIAGNOSIS — K5909 Other constipation: Secondary | ICD-10-CM | POA: Diagnosis not present

## 2022-08-10 ENCOUNTER — Telehealth: Payer: Medicare Other | Admitting: Nurse Practitioner

## 2022-08-10 DIAGNOSIS — U071 COVID-19: Secondary | ICD-10-CM

## 2022-08-10 MED ORDER — BENZONATATE 100 MG PO CAPS
100.0000 mg | ORAL_CAPSULE | Freq: Three times a day (TID) | ORAL | 0 refills | Status: AC | PRN
Start: 2022-08-10 — End: ?

## 2022-08-10 MED ORDER — MOLNUPIRAVIR EUA 200MG CAPSULE
4.0000 | ORAL_CAPSULE | Freq: Two times a day (BID) | ORAL | 0 refills | Status: AC
Start: 2022-08-10 — End: 2022-08-15

## 2022-08-10 MED ORDER — IPRATROPIUM BROMIDE 0.03 % NA SOLN
2.0000 | Freq: Two times a day (BID) | NASAL | 12 refills | Status: AC
Start: 2022-08-10 — End: ?

## 2022-08-10 NOTE — Progress Notes (Signed)
Virtual Visit Consent   Joseph [name], you are scheduled for a virtual visit with a [facility] provider today. Just as with appointments in the office, your consent must be obtained to participate. Your consent will be active for this visit and any virtual visit you may have with one of our providers in the next 365 days. If you have a MyChart account, a copy of this consent can be sent to you electronically.  As this is a virtual visit, video technology does not allow for your provider to perform a traditional examination. This may limit your provider's ability to fully assess your condition. If your provider identifies any concerns that need to be evaluated in person or the need to arrange testing (such as labs, EKG, etc.), we will make arrangements to do so. Although advances in technology are sophisticated, we cannot ensure that it will always work on either your end or our end. If the connection with a video visit is poor, the visit may have to be switched to a telephone visit. With either a video or telephone visit, we are not always able to ensure that we have a secure connection.  By engaging in this virtual visit, you consent to the provision of healthcare and authorize for your insurance to be billed (if applicable) for the services provided during this visit. Depending on your insurance coverage, you may receive a charge related to this service.  I need to obtain your verbal consent now. Are you willing to proceed with your visit today? Joseph [name] has provided verbal consent on 08/10/2022 for a virtual visit (video or telephone). Viviano Simas, FNP  Date: 08/10/2022 10:11 AM  Virtual Visit via Video Note   I, Viviano Simas, connected with  Joseph [name]  ([id number], [id number]) on 08/10/22 at 10:15 AM EDT by a video-enabled telemedicine application and verified that I am speaking with the correct person using two identifiers.  Location: Patient: Virtual Visit Location Patient:  Home Provider: Virtual Visit Location Provider: Home Office   I discussed the limitations of evaluation and management by telemedicine and the availability of in person appointments. The patient expressed understanding and agreed to proceed.    History of Present Illness: Joseph [name] is a 72 y.o. who identifies as a male who was assigned male at birth, and is being seen today for COVID  Took a home COVID test over the weekend and was positive x2 yesterday   He started to have symptoms 2 days ago  Symptoms have been: Body aches, head congestion  Sore throat that has now resolved  Fatigue   He has had COVID once in the past  Mild case  No complications  He has not taken an anti-viral in the past for COVID   He has been vaccinated for COVID including recent booster   Denies a history of kidney disease   Problems:  Patient Active Problem List   Diagnosis Date Noted   Post-surgical scoliosis 07/15/2022   Physical deconditioning 07/15/2022   Thoracic back pain 10/07/2021   Acute embolism and thrombosis of unspecified deep veins of unspecified lower extremity (HCC) 07/24/2021   Primary osteoarthritis of one hip, right 07/24/2021   GERD (gastroesophageal reflux disease) 05/13/2021   Hyperglycemia 09/03/2020   Low back pain 11/08/2019   Hip pain, chronic, right 11/08/2019   Neoplasm of uncertain behavior of skin 08/02/2019   Ingrown toenail without infection 08/02/2019   Warts 04/25/2019   Memory problem 03/09/2018   Colon  polyps 02/19/2016   Hip bursitis, left 02/19/2016   Long term current use of anticoagulant therapy 07/31/2015   Piriformis syndrome of right side 07/31/2015   Right foot pain 10/03/2014   LLQ abdominal pain 03/28/2014   Well adult exam 09/05/2012   Pain in joint, shoulder region 05/30/2012   Global amnesia 03/30/2011   Hypogonadism male 11/27/2010   BPH (benign prostatic hyperplasia) 06/03/2010   Anxiety disorder 02/18/2010   OTITIS MEDIA 02/18/2010    Hypothyroidism 11/12/2009   KNEE PAIN 08/07/2009   FLATULENCE 02/04/2009   TOBACCO USE, QUIT 10/30/2008   Venous (peripheral) insufficiency 05/01/2008   URINARY FREQUENCY, CHRONIC 05/01/2008   Edema 10/12/2007   SUBCONJUNCTIVAL HEMORRHAGE, LEFT 07/12/2007   Obesity 01/03/2007   ELEVATED BP 01/03/2007   Osteoarthritis 12/02/2006   ERECTILE DYSFUNCTION 08/30/2006   DVT, HX OF 08/30/2006    Allergies:  Allergies  Allergen Reactions   Erythromycin     REACTION: nausea   Medications:  Current Outpatient Medications:    ALPRAZolam (XANAX) 1 MG tablet, TAKE ONE HALF TO ONE TABLET BY MOUTH TWO TIMES A DAY AS NEEDED FOR ANXIETY, Disp: 180 tablet, Rfl: 1   Cholecalciferol (VITAMIN D3) 50 MCG (2000 UT) capsule, Take 1 capsule (2,000 Units total) by mouth daily., Disp: 100 capsule, Rfl: 3   cyclobenzaprine (FLEXERIL) 5 MG tablet, Take 1 tablet (5 mg total) by mouth 3 (three) times daily as needed for muscle spasms., Disp: 90 tablet, Rfl: 1   famotidine (PEPCID) 40 MG tablet, TAKE ONE TABLET BY MOUTH DAILY, Disp: 60 tablet, Rfl: 5   levothyroxine (SYNTHROID) 25 MCG tablet, TAKE ONE TABLET BY MOUTH DAILY, Disp: 90 tablet, Rfl: 3   rivaroxaban (XARELTO) 20 MG TABS tablet, Take 1 tablet (20 mg total) by mouth daily. TAKE ONE TABLET BY MOUTH DAILY, Disp: 90 tablet, Rfl: 3   sildenafil (VIAGRA) 100 MG tablet, Take 1 tablet (100 mg total) by mouth as needed for erectile dysfunction., Disp: 100 tablet, Rfl: 1  Observations/Objective: Patient is well-developed, well-nourished in no acute distress.  Resting comfortably  at home.  Head is normocephalic, atraumatic.  No labored breathing.  Speech is clear and coherent with logical content.  Patient is alert and oriented at baseline.    Assessment and Plan: 1. COVID-19  May continue to use over the counter Catering manager for relief   Not a candidate for Paxlovid due to Xeralto   - molnupiravir EUA (LAGEVRIO) 200 mg CAPS capsule; Take 4 capsules  (800 mg total) by mouth 2 (two) times daily for 5 days.  Dispense: 40 capsule; Refill: 0  - benzonatate (TESSALON) 100 MG capsule; Take 1 capsule (100 mg total) by mouth 3 (three) times daily as needed.  Dispense: 30 capsule; Refill: 0  - ipratropium (ATROVENT) 0.03 % nasal spray; Place 2 sprays into both nostrils every 12 (twelve) hours.  Dispense: 30 mL; Refill: 12     Follow Up Instructions: I discussed the assessment and treatment plan with the patient. The patient was provided an opportunity to ask questions and all were answered. The patient agreed with the plan and demonstrated an understanding of the instructions.  A copy of instructions were sent to the patient via MyChart unless otherwise noted below.    The patient was advised to call back or seek an in-person evaluation if the symptoms worsen or if the condition fails to improve as anticipated.  Time:  I spent 15 minutes with the patient via telehealth technology discussing the above problems/concerns.  Viviano Simas, FNP

## 2022-08-13 ENCOUNTER — Other Ambulatory Visit: Payer: Self-pay | Admitting: Internal Medicine

## 2022-09-03 ENCOUNTER — Encounter (INDEPENDENT_AMBULATORY_CARE_PROVIDER_SITE_OTHER): Payer: Self-pay

## 2022-09-08 DIAGNOSIS — M9903 Segmental and somatic dysfunction of lumbar region: Secondary | ICD-10-CM | POA: Diagnosis not present

## 2022-09-08 DIAGNOSIS — M9904 Segmental and somatic dysfunction of sacral region: Secondary | ICD-10-CM | POA: Diagnosis not present

## 2022-09-08 DIAGNOSIS — M48061 Spinal stenosis, lumbar region without neurogenic claudication: Secondary | ICD-10-CM | POA: Diagnosis not present

## 2022-09-08 DIAGNOSIS — M47897 Other spondylosis, lumbosacral region: Secondary | ICD-10-CM | POA: Diagnosis not present

## 2022-09-10 DIAGNOSIS — M47897 Other spondylosis, lumbosacral region: Secondary | ICD-10-CM | POA: Diagnosis not present

## 2022-09-10 DIAGNOSIS — M9904 Segmental and somatic dysfunction of sacral region: Secondary | ICD-10-CM | POA: Diagnosis not present

## 2022-09-10 DIAGNOSIS — M48061 Spinal stenosis, lumbar region without neurogenic claudication: Secondary | ICD-10-CM | POA: Diagnosis not present

## 2022-09-10 DIAGNOSIS — M9903 Segmental and somatic dysfunction of lumbar region: Secondary | ICD-10-CM | POA: Diagnosis not present

## 2022-09-15 DIAGNOSIS — K080 Exfoliation of teeth due to systemic causes: Secondary | ICD-10-CM | POA: Diagnosis not present

## 2022-09-16 DIAGNOSIS — M9904 Segmental and somatic dysfunction of sacral region: Secondary | ICD-10-CM | POA: Diagnosis not present

## 2022-09-16 DIAGNOSIS — M47897 Other spondylosis, lumbosacral region: Secondary | ICD-10-CM | POA: Diagnosis not present

## 2022-09-16 DIAGNOSIS — M48061 Spinal stenosis, lumbar region without neurogenic claudication: Secondary | ICD-10-CM | POA: Diagnosis not present

## 2022-09-16 DIAGNOSIS — M9903 Segmental and somatic dysfunction of lumbar region: Secondary | ICD-10-CM | POA: Diagnosis not present

## 2022-09-23 DIAGNOSIS — M9904 Segmental and somatic dysfunction of sacral region: Secondary | ICD-10-CM | POA: Diagnosis not present

## 2022-09-23 DIAGNOSIS — M48061 Spinal stenosis, lumbar region without neurogenic claudication: Secondary | ICD-10-CM | POA: Diagnosis not present

## 2022-09-23 DIAGNOSIS — M47897 Other spondylosis, lumbosacral region: Secondary | ICD-10-CM | POA: Diagnosis not present

## 2022-09-23 DIAGNOSIS — M9903 Segmental and somatic dysfunction of lumbar region: Secondary | ICD-10-CM | POA: Diagnosis not present

## 2022-10-15 ENCOUNTER — Encounter: Payer: Medicare Other | Admitting: Internal Medicine

## 2022-10-29 DIAGNOSIS — M1712 Unilateral primary osteoarthritis, left knee: Secondary | ICD-10-CM | POA: Diagnosis not present

## 2022-10-29 DIAGNOSIS — M25562 Pain in left knee: Secondary | ICD-10-CM | POA: Diagnosis not present

## 2022-11-04 ENCOUNTER — Encounter: Payer: Self-pay | Admitting: Internal Medicine

## 2022-11-04 DIAGNOSIS — K6389 Other specified diseases of intestine: Secondary | ICD-10-CM | POA: Diagnosis not present

## 2022-11-04 DIAGNOSIS — D120 Benign neoplasm of cecum: Secondary | ICD-10-CM | POA: Diagnosis not present

## 2022-11-04 DIAGNOSIS — Z860101 Personal history of adenomatous and serrated colon polyps: Secondary | ICD-10-CM | POA: Diagnosis not present

## 2022-11-04 DIAGNOSIS — Z1211 Encounter for screening for malignant neoplasm of colon: Secondary | ICD-10-CM | POA: Diagnosis not present

## 2022-11-04 DIAGNOSIS — K641 Second degree hemorrhoids: Secondary | ICD-10-CM | POA: Diagnosis not present

## 2022-11-04 DIAGNOSIS — D123 Benign neoplasm of transverse colon: Secondary | ICD-10-CM | POA: Diagnosis not present

## 2022-11-04 DIAGNOSIS — D124 Benign neoplasm of descending colon: Secondary | ICD-10-CM | POA: Diagnosis not present

## 2022-11-04 LAB — HM COLONOSCOPY

## 2022-11-19 ENCOUNTER — Other Ambulatory Visit: Payer: Self-pay | Admitting: Internal Medicine

## 2023-01-15 DIAGNOSIS — W2203XA Walked into furniture, initial encounter: Secondary | ICD-10-CM | POA: Diagnosis not present

## 2023-01-15 DIAGNOSIS — S62616A Displaced fracture of proximal phalanx of right little finger, initial encounter for closed fracture: Secondary | ICD-10-CM | POA: Diagnosis not present

## 2023-01-15 DIAGNOSIS — Z87891 Personal history of nicotine dependence: Secondary | ICD-10-CM | POA: Diagnosis not present

## 2023-01-15 DIAGNOSIS — Y92010 Kitchen of single-family (private) house as the place of occurrence of the external cause: Secondary | ICD-10-CM | POA: Diagnosis not present

## 2023-01-15 DIAGNOSIS — S62646A Nondisplaced fracture of proximal phalanx of right little finger, initial encounter for closed fracture: Secondary | ICD-10-CM | POA: Diagnosis not present

## 2023-01-24 DIAGNOSIS — T07XXXA Unspecified multiple injuries, initial encounter: Secondary | ICD-10-CM | POA: Diagnosis not present

## 2023-02-04 DIAGNOSIS — K080 Exfoliation of teeth due to systemic causes: Secondary | ICD-10-CM | POA: Diagnosis not present

## 2023-02-09 DIAGNOSIS — S62617A Displaced fracture of proximal phalanx of left little finger, initial encounter for closed fracture: Secondary | ICD-10-CM | POA: Diagnosis not present

## 2023-02-09 DIAGNOSIS — M79641 Pain in right hand: Secondary | ICD-10-CM | POA: Diagnosis not present

## 2023-03-23 DIAGNOSIS — Z01818 Encounter for other preprocedural examination: Secondary | ICD-10-CM | POA: Diagnosis not present

## 2023-03-23 DIAGNOSIS — M170 Bilateral primary osteoarthritis of knee: Secondary | ICD-10-CM | POA: Diagnosis not present

## 2023-03-23 DIAGNOSIS — M1611 Unilateral primary osteoarthritis, right hip: Secondary | ICD-10-CM | POA: Diagnosis not present

## 2023-04-01 ENCOUNTER — Ambulatory Visit: Admitting: Internal Medicine

## 2023-04-01 ENCOUNTER — Encounter: Payer: Self-pay | Admitting: Internal Medicine

## 2023-04-01 VITALS — BP 120/82 | HR 64 | Temp 98.3°F | Ht 73.0 in | Wt 288.0 lb

## 2023-04-01 DIAGNOSIS — Z01818 Encounter for other preprocedural examination: Secondary | ICD-10-CM | POA: Insufficient documentation

## 2023-04-01 DIAGNOSIS — R413 Other amnesia: Secondary | ICD-10-CM

## 2023-04-01 DIAGNOSIS — M25562 Pain in left knee: Secondary | ICD-10-CM

## 2023-04-01 DIAGNOSIS — E66812 Obesity, class 2: Secondary | ICD-10-CM | POA: Diagnosis not present

## 2023-04-01 DIAGNOSIS — E039 Hypothyroidism, unspecified: Secondary | ICD-10-CM

## 2023-04-01 DIAGNOSIS — I82513 Chronic embolism and thrombosis of femoral vein, bilateral: Secondary | ICD-10-CM | POA: Diagnosis not present

## 2023-04-01 DIAGNOSIS — N401 Enlarged prostate with lower urinary tract symptoms: Secondary | ICD-10-CM | POA: Diagnosis not present

## 2023-04-01 DIAGNOSIS — F419 Anxiety disorder, unspecified: Secondary | ICD-10-CM | POA: Diagnosis not present

## 2023-04-01 DIAGNOSIS — Z6836 Body mass index (BMI) 36.0-36.9, adult: Secondary | ICD-10-CM

## 2023-04-01 DIAGNOSIS — Z0000 Encounter for general adult medical examination without abnormal findings: Secondary | ICD-10-CM

## 2023-04-01 DIAGNOSIS — M25561 Pain in right knee: Secondary | ICD-10-CM

## 2023-04-01 DIAGNOSIS — R350 Frequency of micturition: Secondary | ICD-10-CM

## 2023-04-01 DIAGNOSIS — I82409 Acute embolism and thrombosis of unspecified deep veins of unspecified lower extremity: Secondary | ICD-10-CM | POA: Insufficient documentation

## 2023-04-01 DIAGNOSIS — Z86718 Personal history of other venous thrombosis and embolism: Secondary | ICD-10-CM

## 2023-04-01 LAB — LIPID PANEL
Cholesterol: 128 mg/dL (ref 0–200)
HDL: 46.4 mg/dL (ref >39.00)
LDL Cholesterol: 59 mg/dL (ref 0–99)
NonHDL: 81.97
Total CHOL/HDL Ratio: 3
Triglycerides: 116 mg/dL (ref 0.0–149.0)
VLDL: 23.2 mg/dL (ref 0.0–40.0)

## 2023-04-01 LAB — COMPREHENSIVE METABOLIC PANEL
ALT: 14 U/L (ref 0–53)
AST: 14 U/L (ref 0–37)
Albumin: 4.4 g/dL (ref 3.5–5.2)
Alkaline Phosphatase: 53 U/L (ref 39–117)
BUN: 17 mg/dL (ref 6–23)
CO2: 27 meq/L (ref 19–32)
Calcium: 9.3 mg/dL (ref 8.4–10.5)
Chloride: 103 meq/L (ref 96–112)
Creatinine, Ser: 1.15 mg/dL (ref 0.40–1.50)
GFR: 63.52 mL/min (ref >60.00)
Glucose, Bld: 83 mg/dL (ref 70–99)
Potassium: 4.4 meq/L (ref 3.5–5.1)
Sodium: 137 meq/L (ref 135–145)
Total Bilirubin: 0.6 mg/dL (ref 0.2–1.2)
Total Protein: 6.6 g/dL (ref 6.0–8.3)

## 2023-04-01 LAB — URINALYSIS
Bilirubin Urine: NEGATIVE
Hgb urine dipstick: NEGATIVE
Ketones, ur: NEGATIVE
Leukocytes,Ua: NEGATIVE
Nitrite: NEGATIVE
Specific Gravity, Urine: 1.025 (ref 1.000–1.030)
Total Protein, Urine: NEGATIVE
Urine Glucose: NEGATIVE
Urobilinogen, UA: 0.2 (ref 0.0–1.0)
pH: 6 (ref 5.0–8.0)

## 2023-04-01 LAB — CBC WITH DIFFERENTIAL/PLATELET
Basophils Absolute: 0 10*3/uL (ref 0.0–0.1)
Basophils Relative: 0.5 % (ref 0.0–3.0)
Eosinophils Absolute: 0.1 10*3/uL (ref 0.0–0.7)
Eosinophils Relative: 1 % (ref 0.0–5.0)
HCT: 44.1 % (ref 39.0–52.0)
Hemoglobin: 14.8 g/dL (ref 13.0–17.0)
Lymphocytes Relative: 21.4 % (ref 12.0–46.0)
Lymphs Abs: 1.9 10*3/uL (ref 0.7–4.0)
MCHC: 33.6 g/dL (ref 30.0–36.0)
MCV: 99.6 fl (ref 78.0–100.0)
Monocytes Absolute: 0.7 10*3/uL (ref 0.1–1.0)
Monocytes Relative: 7.8 % (ref 3.0–12.0)
Neutro Abs: 6.3 10*3/uL (ref 1.4–7.7)
Neutrophils Relative %: 69.3 % (ref 43.0–77.0)
Platelets: 409 10*3/uL — ABNORMAL HIGH (ref 150.0–400.0)
RBC: 4.42 Mil/uL (ref 4.22–5.81)
RDW: 12.7 % (ref 11.5–15.5)
WBC: 9 10*3/uL (ref 4.0–10.5)

## 2023-04-01 LAB — TSH: TSH: 0.9 u[IU]/mL (ref 0.35–5.50)

## 2023-04-01 LAB — URIC ACID: Uric Acid, Serum: 5.7 mg/dL (ref 4.0–7.8)

## 2023-04-01 LAB — PSA: PSA: 0.7 ng/mL (ref 0.10–4.00)

## 2023-04-01 MED ORDER — ACETAMINOPHEN-CODEINE 300-30 MG PO TABS: 1.0000 | ORAL_TABLET | Freq: Four times a day (QID) | ORAL | 1 refills | Status: AC | PRN

## 2023-04-01 MED ORDER — SILDENAFIL CITRATE 100 MG PO TABS: 100.0000 mg | ORAL_TABLET | ORAL | 1 refills | Status: DC | PRN

## 2023-04-01 MED ORDER — ALPRAZOLAM 1 MG PO TABS: ORAL_TABLET | ORAL | 1 refills | Status: DC

## 2023-04-01 MED ORDER — METHYLPREDNISOLONE 4 MG PO TBPK: ORAL_TABLET | ORAL | 0 refills | Status: AC

## 2023-04-01 NOTE — Patient Instructions (Signed)

## 2023-04-01 NOTE — Assessment & Plan Note (Signed)
Chronic Cont on Xanax 1 mg twice daily prn  Potential benefits of a long term benzodiazepines  use as well as potential risks  and complications were explained to the patient and were aknowledged. 

## 2023-04-01 NOTE — Assessment & Plan Note (Signed)
Remote  

## 2023-04-01 NOTE — Assessment & Plan Note (Signed)
Clear

## 2023-04-01 NOTE — Progress Notes (Signed)
 Subjective:  Patient ID: Joseph [name], male    DOB: 1950-09-10  Age: 73 y.o. MRN: [id number]  CC: Annual Exam   HPI Joseph [name] presents for a well exam C/o knee pain R>L - pain is severe Planning to have L TKR - Dr Derrell Flight in 6-8 wks C/o neck pain, ED, h/o DVT  Outpatient Medications Prior to Visit  Medication Sig Dispense Refill   benzonatate  (TESSALON ) 100 MG capsule Take 1 capsule (100 mg total) by mouth 3 (three) times daily as needed. 30 capsule 0   Cholecalciferol (VITAMIN D3) 50 MCG (2000 UT) capsule Take 1 capsule (2,000 Units total) by mouth daily. 100 capsule 3   cyclobenzaprine  (FLEXERIL ) 5 MG tablet Take 1 tablet (5 mg total) by mouth 3 (three) times daily as needed for muscle spasms. 90 tablet 1   famotidine  (PEPCID ) 40 MG tablet TAKE ONE TABLET BY MOUTH DAILY 90 tablet 3   ipratropium (ATROVENT ) 0.03 % nasal spray Place 2 sprays into both nostrils every 12 (twelve) hours. 30 mL 12   levothyroxine  (SYNTHROID ) 25 MCG tablet TAKE ONE TABLET BY MOUTH DAILY 90 tablet 3   XARELTO  20 MG TABS tablet TAKE 1 TABLET BY MOUTH DAILY 90 tablet 3   ALPRAZolam  (XANAX ) 1 MG tablet TAKE ONE HALF TO ONE TABLET BY MOUTH TWO TIMES A DAY AS NEEDED FOR ANXIETY 180 tablet 1   sildenafil  (VIAGRA ) 100 MG tablet Take 1 tablet (100 mg total) by mouth as needed for erectile dysfunction. 100 tablet 1   No facility-administered medications prior to visit.    ROS: Review of Systems  Constitutional:  Negative for appetite change, fatigue and unexpected weight change.  HENT:  Negative for congestion, nosebleeds, sneezing, sore throat and trouble swallowing.   Eyes:  Negative for itching and visual disturbance.  Respiratory:  Negative for cough.   Cardiovascular:  Negative for chest pain, palpitations and leg swelling.  Gastrointestinal:  Negative for abdominal distention, blood in stool, diarrhea and nausea.  Genitourinary:  Negative for frequency and hematuria.  Musculoskeletal:  Positive  for arthralgias and gait problem. Negative for back pain, joint swelling and neck pain.  Skin:  Negative for rash.  Neurological:  Negative for dizziness, tremors, speech difficulty and weakness.  Psychiatric/Behavioral:  Negative for agitation, dysphoric mood, sleep disturbance and suicidal ideas. The patient is not nervous/anxious.     Objective:  BP 120/82   Pulse 64   Temp 98.3 F (36.8 C) (Oral)   Ht 6\' 1"  (1.854 m)   Wt 288 lb (130.6 kg)   SpO2 95%   BMI 38.00 kg/m   BP Readings from Last 3 Encounters:  04/01/23 120/82  07/15/22 120/78  10/07/21 138/82    Wt Readings from Last 3 Encounters:  04/01/23 288 lb (130.6 kg)  07/15/22 270 lb (122.5 kg)  10/07/21 270 lb (122.5 kg)    Physical Exam Constitutional:      General: He is not in acute distress.    Appearance: He is well-developed. He is obese.     Comments: NAD  Eyes:     Conjunctiva/sclera: Conjunctivae normal.     Pupils: Pupils are equal, round, and reactive to light.  Neck:     Thyroid : No thyromegaly.     Vascular: No JVD.  Cardiovascular:     Rate and Rhythm: Normal rate and regular rhythm.     Heart sounds: Normal heart sounds. No murmur heard.    No friction rub. No gallop.  Pulmonary:  Effort: Pulmonary effort is normal. No respiratory distress.     Breath sounds: Normal breath sounds. No wheezing or rales.  Chest:     Chest wall: No tenderness.  Abdominal:     General: Bowel sounds are normal. There is no distension.     Palpations: Abdomen is soft. There is no mass.     Tenderness: There is no abdominal tenderness. There is no guarding or rebound.  Musculoskeletal:        General: No tenderness. Normal range of motion.     Cervical back: Normal range of motion.  Lymphadenopathy:     Cervical: No cervical adenopathy.  Skin:    General: Skin is warm and dry.     Findings: No rash.  Neurological:     Mental Status: He is alert and oriented to person, place, and time.     Cranial  Nerves: No cranial nerve deficit.     Motor: No abnormal muscle tone.     Coordination: Coordination normal.     Gait: Gait normal.     Deep Tendon Reflexes: Reflexes are normal and symmetric.  Psychiatric:        Behavior: Behavior normal.        Thought Content: Thought content normal.        Judgment: Judgment normal.   Using a cane B knees w/severe pain on ROM, neck - stiff Antalgic gait   Lab Results  Component Value Date   WBC 9.0 04/01/2023   HGB 14.8 04/01/2023   HCT 44.1 04/01/2023   PLT 409.0 (H) 04/01/2023   GLUCOSE 83 04/01/2023   CHOL 128 04/01/2023   TRIG 116.0 04/01/2023   HDL 46.40 04/01/2023   LDLCALC 59 04/01/2023   ALT 14 04/01/2023   AST 14 04/01/2023   NA 137 04/01/2023   K 4.4 04/01/2023   CL 103 04/01/2023   CREATININE 1.15 04/01/2023   BUN 17 04/01/2023   CO2 27 04/01/2023   TSH 0.90 04/01/2023   PSA 0.70 04/01/2023   INR 1.8 (H) 05/13/2021   HGBA1C 6.1 01/02/2021    No results found.  Assessment & Plan:   Problem List Items Addressed This Visit     Hypothyroidism   Check labs Chronic On Levoxyl       Obesity   Relevant Orders   TSH (Completed)   Urinalysis (Completed)   CBC with Differential/Platelet (Completed)   Lipid panel (Completed)   PSA (Completed)   Comprehensive metabolic panel (Completed)   Uric acid (Completed)   KNEE PAIN (Chronic)   Planning to have L TKR - Dr Derrell Flight in 6-8 wks      DVT, HX OF   Anxiety disorder   Chronic Cont on Xanax  1 mg twice daily prn  Potential benefits of a long term benzodiazepines  use as well as potential risks  and complications were explained to the patient and were aknowledged.      Relevant Medications   ALPRAZolam  (XANAX ) 1 MG tablet   Other Relevant Orders   TSH (Completed)   Urinalysis (Completed)   CBC with Differential/Platelet (Completed)   Lipid panel (Completed)   PSA (Completed)   Comprehensive metabolic panel (Completed)   Uric acid (Completed)   BPH (benign  prostatic hyperplasia)   Well adult exam - Primary   Relevant Orders   TSH (Completed)   Urinalysis (Completed)   CBC with Differential/Platelet (Completed)   Lipid panel (Completed)   PSA (Completed)   Comprehensive metabolic panel (Completed)  Uric acid (Completed)   Memory problem   Relevant Orders   TSH (Completed)   Urinalysis (Completed)   CBC with Differential/Platelet (Completed)   Lipid panel (Completed)   PSA (Completed)   Comprehensive metabolic panel (Completed)   Uric acid (Completed)   Deep vein thrombosis (DVT) (HCC)   Remote      Preop exam for internal medicine   Clear         Meds ordered this encounter  Medications   ALPRAZolam  (XANAX ) 1 MG tablet    Sig: TAKE ONE HALF TO ONE TABLET BY MOUTH TWO TIMES A DAY AS NEEDED FOR ANXIETY    Dispense:  180 tablet    Refill:  1   DISCONTD: sildenafil  (VIAGRA ) 100 MG tablet    Sig: Take 1 tablet (100 mg total) by mouth as needed for erectile dysfunction.    Dispense:  100 tablet    Refill:  1   acetaminophen -codeine  (TYLENOL  #3) 300-30 MG tablet    Sig: Take 1 tablet by mouth every 6 (six) hours as needed for moderate pain (pain score 4-6).    Dispense:  20 tablet    Refill:  1   methylPREDNISolone  (MEDROL  DOSEPAK) 4 MG TBPK tablet    Sig: As directed    Dispense:  21 tablet    Refill:  0      Follow-up: Return in about 3 months (around 07/02/2023) for a follow-up visit.  Anitra Barn, MD

## 2023-04-02 ENCOUNTER — Encounter: Payer: Self-pay | Admitting: Internal Medicine

## 2023-04-02 DIAGNOSIS — M170 Bilateral primary osteoarthritis of knee: Secondary | ICD-10-CM | POA: Diagnosis not present

## 2023-04-22 ENCOUNTER — Encounter: Payer: Self-pay | Admitting: Internal Medicine

## 2023-04-27 ENCOUNTER — Other Ambulatory Visit: Payer: Self-pay

## 2023-04-27 MED ORDER — SILDENAFIL CITRATE 100 MG PO TABS: 100.0000 mg | ORAL_TABLET | ORAL | 1 refills | Status: DC | PRN

## 2023-05-09 NOTE — Assessment & Plan Note (Signed)
 Check labs Chronic On Levoxyl 

## 2023-05-09 NOTE — Assessment & Plan Note (Signed)
 Planning to have L TKR - Dr Derrell Flight in 6-8 wks

## 2023-06-28 DIAGNOSIS — Z01818 Encounter for other preprocedural examination: Secondary | ICD-10-CM | POA: Diagnosis not present

## 2023-06-28 DIAGNOSIS — M170 Bilateral primary osteoarthritis of knee: Secondary | ICD-10-CM | POA: Diagnosis not present

## 2023-07-05 DIAGNOSIS — Z87891 Personal history of nicotine dependence: Secondary | ICD-10-CM | POA: Diagnosis not present

## 2023-07-05 DIAGNOSIS — M65861 Other synovitis and tenosynovitis, right lower leg: Secondary | ICD-10-CM | POA: Diagnosis not present

## 2023-07-05 DIAGNOSIS — Z7901 Long term (current) use of anticoagulants: Secondary | ICD-10-CM | POA: Diagnosis not present

## 2023-07-05 DIAGNOSIS — M21061 Valgus deformity, not elsewhere classified, right knee: Secondary | ICD-10-CM | POA: Diagnosis not present

## 2023-07-05 DIAGNOSIS — Z86718 Personal history of other venous thrombosis and embolism: Secondary | ICD-10-CM | POA: Diagnosis not present

## 2023-07-05 DIAGNOSIS — M25761 Osteophyte, right knee: Secondary | ICD-10-CM | POA: Diagnosis not present

## 2023-07-05 DIAGNOSIS — M1711 Unilateral primary osteoarthritis, right knee: Secondary | ICD-10-CM | POA: Diagnosis not present

## 2023-07-05 DIAGNOSIS — M170 Bilateral primary osteoarthritis of knee: Secondary | ICD-10-CM | POA: Diagnosis not present

## 2023-07-05 DIAGNOSIS — Z79899 Other long term (current) drug therapy: Secondary | ICD-10-CM | POA: Diagnosis not present

## 2023-07-05 HISTORY — PX: KNEE SURGERY: SHX244

## 2023-07-12 DIAGNOSIS — M25562 Pain in left knee: Secondary | ICD-10-CM | POA: Diagnosis not present

## 2023-07-12 DIAGNOSIS — R269 Unspecified abnormalities of gait and mobility: Secondary | ICD-10-CM | POA: Diagnosis not present

## 2023-07-12 DIAGNOSIS — M25561 Pain in right knee: Secondary | ICD-10-CM | POA: Diagnosis not present

## 2023-07-12 DIAGNOSIS — Z7409 Other reduced mobility: Secondary | ICD-10-CM | POA: Diagnosis not present

## 2023-07-12 DIAGNOSIS — M170 Bilateral primary osteoarthritis of knee: Secondary | ICD-10-CM | POA: Diagnosis not present

## 2023-07-14 DIAGNOSIS — M25561 Pain in right knee: Secondary | ICD-10-CM | POA: Diagnosis not present

## 2023-07-14 DIAGNOSIS — R269 Unspecified abnormalities of gait and mobility: Secondary | ICD-10-CM | POA: Diagnosis not present

## 2023-07-14 DIAGNOSIS — M170 Bilateral primary osteoarthritis of knee: Secondary | ICD-10-CM | POA: Diagnosis not present

## 2023-07-14 DIAGNOSIS — M25562 Pain in left knee: Secondary | ICD-10-CM | POA: Diagnosis not present

## 2023-07-14 DIAGNOSIS — Z7409 Other reduced mobility: Secondary | ICD-10-CM | POA: Diagnosis not present

## 2023-07-19 DIAGNOSIS — R269 Unspecified abnormalities of gait and mobility: Secondary | ICD-10-CM | POA: Diagnosis not present

## 2023-07-19 DIAGNOSIS — M25561 Pain in right knee: Secondary | ICD-10-CM | POA: Diagnosis not present

## 2023-07-19 DIAGNOSIS — M170 Bilateral primary osteoarthritis of knee: Secondary | ICD-10-CM | POA: Diagnosis not present

## 2023-07-19 DIAGNOSIS — Z7409 Other reduced mobility: Secondary | ICD-10-CM | POA: Diagnosis not present

## 2023-07-19 DIAGNOSIS — M25562 Pain in left knee: Secondary | ICD-10-CM | POA: Diagnosis not present

## 2023-07-20 DIAGNOSIS — Z96651 Presence of right artificial knee joint: Secondary | ICD-10-CM | POA: Diagnosis not present

## 2023-07-21 DIAGNOSIS — R262 Difficulty in walking, not elsewhere classified: Secondary | ICD-10-CM | POA: Diagnosis not present

## 2023-07-21 DIAGNOSIS — M170 Bilateral primary osteoarthritis of knee: Secondary | ICD-10-CM | POA: Diagnosis not present

## 2023-07-21 DIAGNOSIS — Z96651 Presence of right artificial knee joint: Secondary | ICD-10-CM | POA: Diagnosis not present

## 2023-07-26 DIAGNOSIS — M170 Bilateral primary osteoarthritis of knee: Secondary | ICD-10-CM | POA: Diagnosis not present

## 2023-07-26 DIAGNOSIS — R262 Difficulty in walking, not elsewhere classified: Secondary | ICD-10-CM | POA: Diagnosis not present

## 2023-07-26 DIAGNOSIS — Z96651 Presence of right artificial knee joint: Secondary | ICD-10-CM | POA: Diagnosis not present

## 2023-07-28 DIAGNOSIS — M170 Bilateral primary osteoarthritis of knee: Secondary | ICD-10-CM | POA: Diagnosis not present

## 2023-07-28 DIAGNOSIS — Z96651 Presence of right artificial knee joint: Secondary | ICD-10-CM | POA: Diagnosis not present

## 2023-07-28 DIAGNOSIS — R262 Difficulty in walking, not elsewhere classified: Secondary | ICD-10-CM | POA: Diagnosis not present

## 2023-08-02 DIAGNOSIS — M170 Bilateral primary osteoarthritis of knee: Secondary | ICD-10-CM | POA: Diagnosis not present

## 2023-08-02 DIAGNOSIS — Z96651 Presence of right artificial knee joint: Secondary | ICD-10-CM | POA: Diagnosis not present

## 2023-08-02 DIAGNOSIS — R262 Difficulty in walking, not elsewhere classified: Secondary | ICD-10-CM | POA: Diagnosis not present

## 2023-08-04 DIAGNOSIS — R262 Difficulty in walking, not elsewhere classified: Secondary | ICD-10-CM | POA: Diagnosis not present

## 2023-08-04 DIAGNOSIS — Z96651 Presence of right artificial knee joint: Secondary | ICD-10-CM | POA: Diagnosis not present

## 2023-08-04 DIAGNOSIS — M170 Bilateral primary osteoarthritis of knee: Secondary | ICD-10-CM | POA: Diagnosis not present

## 2023-08-09 DIAGNOSIS — Z96651 Presence of right artificial knee joint: Secondary | ICD-10-CM | POA: Diagnosis not present

## 2023-08-09 DIAGNOSIS — M170 Bilateral primary osteoarthritis of knee: Secondary | ICD-10-CM | POA: Diagnosis not present

## 2023-08-09 DIAGNOSIS — R262 Difficulty in walking, not elsewhere classified: Secondary | ICD-10-CM | POA: Diagnosis not present

## 2023-08-14 ENCOUNTER — Other Ambulatory Visit: Payer: Self-pay | Admitting: Internal Medicine

## 2023-08-16 DIAGNOSIS — R262 Difficulty in walking, not elsewhere classified: Secondary | ICD-10-CM | POA: Diagnosis not present

## 2023-08-16 DIAGNOSIS — Z96651 Presence of right artificial knee joint: Secondary | ICD-10-CM | POA: Diagnosis not present

## 2023-08-16 DIAGNOSIS — M170 Bilateral primary osteoarthritis of knee: Secondary | ICD-10-CM | POA: Diagnosis not present

## 2023-08-23 DIAGNOSIS — R262 Difficulty in walking, not elsewhere classified: Secondary | ICD-10-CM | POA: Diagnosis not present

## 2023-08-23 DIAGNOSIS — M170 Bilateral primary osteoarthritis of knee: Secondary | ICD-10-CM | POA: Diagnosis not present

## 2023-08-23 DIAGNOSIS — Z96651 Presence of right artificial knee joint: Secondary | ICD-10-CM | POA: Diagnosis not present

## 2023-08-25 DIAGNOSIS — Z96651 Presence of right artificial knee joint: Secondary | ICD-10-CM | POA: Diagnosis not present

## 2023-08-25 DIAGNOSIS — M170 Bilateral primary osteoarthritis of knee: Secondary | ICD-10-CM | POA: Diagnosis not present

## 2023-08-25 DIAGNOSIS — R262 Difficulty in walking, not elsewhere classified: Secondary | ICD-10-CM | POA: Diagnosis not present

## 2023-08-30 DIAGNOSIS — R262 Difficulty in walking, not elsewhere classified: Secondary | ICD-10-CM | POA: Diagnosis not present

## 2023-08-30 DIAGNOSIS — M170 Bilateral primary osteoarthritis of knee: Secondary | ICD-10-CM | POA: Diagnosis not present

## 2023-08-30 DIAGNOSIS — Z96651 Presence of right artificial knee joint: Secondary | ICD-10-CM | POA: Diagnosis not present

## 2023-09-06 DIAGNOSIS — Z96651 Presence of right artificial knee joint: Secondary | ICD-10-CM | POA: Diagnosis not present

## 2023-09-06 DIAGNOSIS — M170 Bilateral primary osteoarthritis of knee: Secondary | ICD-10-CM | POA: Diagnosis not present

## 2023-09-06 DIAGNOSIS — R262 Difficulty in walking, not elsewhere classified: Secondary | ICD-10-CM | POA: Diagnosis not present

## 2023-09-08 DIAGNOSIS — Z96651 Presence of right artificial knee joint: Secondary | ICD-10-CM | POA: Diagnosis not present

## 2023-09-08 DIAGNOSIS — M170 Bilateral primary osteoarthritis of knee: Secondary | ICD-10-CM | POA: Diagnosis not present

## 2023-09-08 DIAGNOSIS — R262 Difficulty in walking, not elsewhere classified: Secondary | ICD-10-CM | POA: Diagnosis not present

## 2023-09-13 DIAGNOSIS — Z96651 Presence of right artificial knee joint: Secondary | ICD-10-CM | POA: Diagnosis not present

## 2023-09-13 DIAGNOSIS — M170 Bilateral primary osteoarthritis of knee: Secondary | ICD-10-CM | POA: Diagnosis not present

## 2023-09-13 DIAGNOSIS — R262 Difficulty in walking, not elsewhere classified: Secondary | ICD-10-CM | POA: Diagnosis not present

## 2023-09-15 ENCOUNTER — Ambulatory Visit

## 2023-09-15 VITALS — Ht 73.0 in | Wt 288.0 lb

## 2023-09-15 DIAGNOSIS — Z0000 Encounter for general adult medical examination without abnormal findings: Secondary | ICD-10-CM

## 2023-09-15 NOTE — Progress Notes (Cosign Needed Addendum)
 Subjective:   Joseph [name] is a 73 y.o. who presents for a Medicare Wellness preventive visit.  As a reminder, Annual Wellness Visits don't include a physical exam, and some assessments may be limited, especially if this visit is performed virtually. We may recommend an in-person follow-up visit with your provider if needed.  Visit Complete: Virtual I connected with  Joseph [name] on 09/15/23 by a audio enabled telemedicine application and verified that I am speaking with the correct person using two identifiers.  Patient Location: Home  Provider Location: Home Office  I discussed the limitations of evaluation and management by telemedicine. The patient expressed understanding and agreed to proceed.  Vital Signs: Because this visit was a virtual/telehealth visit, some criteria may be missing or patient reported. Any vitals not documented were not able to be obtained and vitals that have been documented are patient reported.  VideoDeclined- This patient declined Librarian, academic. Therefore the visit was completed with audio only.  Persons Participating in Visit: Patient.  AWV Questionnaire: No: Patient Medicare AWV questionnaire was not completed prior to this visit.  Cardiac Risk Factors include: advanced age (>96men, >34 women);male gender;Other (see comment);obesity (BMI >30kg/m2), Risk factor comments: BPH     Objective:    Today's Vitals   09/15/23 1443 09/15/23 1444  Weight: 288 lb (130.6 kg)   Height: 6' 1 (1.854 m)   PainSc:  4    Body mass index is 38 kg/m.     09/15/2023    3:09 PM 04/09/2020   10:37 AM 01/02/2011   10:16 AM  Advanced Directives  Does Patient Have a Medical Advance Directive? No No Patient does not have advance directive   Would patient like information on creating a medical advance directive?  No - Patient declined   Pre-existing out of facility DNR order (yellow form or pink MOST form)   No      Data saved  with a previous flowsheet row definition    Current Medications (verified) Outpatient Encounter Medications as of 09/15/2023  Medication Sig   acetaminophen -codeine  (TYLENOL  #3) 300-30 MG tablet Take 1 tablet by mouth every 6 (six) hours as needed for moderate pain (pain score 4-6).   ALPRAZolam  (XANAX ) 1 MG tablet TAKE ONE HALF TO ONE TABLET BY MOUTH TWO TIMES A DAY AS NEEDED FOR ANXIETY   benzonatate  (TESSALON ) 100 MG capsule Take 1 capsule (100 mg total) by mouth 3 (three) times daily as needed.   Cholecalciferol (VITAMIN D3) 50 MCG (2000 UT) capsule Take 1 capsule (2,000 Units total) by mouth daily.   famotidine  (PEPCID ) 40 MG tablet TAKE ONE TABLET BY MOUTH DAILY   ipratropium (ATROVENT ) 0.03 % nasal spray Place 2 sprays into both nostrils every 12 (twelve) hours.   levothyroxine  (SYNTHROID ) 25 MCG tablet TAKE 1 TABLET BY MOUTH DAILY   sildenafil  (VIAGRA ) 100 MG tablet Take 1 tablet (100 mg total) by mouth as needed for erectile dysfunction.   XARELTO  20 MG TABS tablet TAKE 1 TABLET BY MOUTH DAILY   cyclobenzaprine  (FLEXERIL ) 5 MG tablet Take 1 tablet (5 mg total) by mouth 3 (three) times daily as needed for muscle spasms. (Patient not taking: Reported on 09/15/2023)   methylPREDNISolone  (MEDROL  DOSEPAK) 4 MG TBPK tablet As directed (Patient not taking: Reported on 09/15/2023)   No facility-administered encounter medications on file as of 09/15/2023.    Allergies (verified) Patient has no active allergies.   History: Past Medical History:  Diagnosis Date  Anxiety    DVT of leg (deep venous thrombosis) (HCC)    ED (erectile dysfunction)    Hypothyroidism 2011   Obesity    Osteoarthritis    Warfarin anticoagulation    Past Surgical History:  Procedure Laterality Date   COLONOSCOPY  01/02/2011   Procedure: COLONOSCOPY;  Surgeon: Joseph JONETTA Aho, MD;  Location: [facility];  Service: Endoscopy;  Laterality: N/A;   Dvt   Lysis   HERNIA REPAIR     HERNIA REPAIR     KNEE SURGERY  Right 07/05/2023   TOTAL HIP ARTHROPLASTY Right 01/2022   Family History  Problem Relation Age of Onset   Heart disease Father 19       MI   Coronary artery disease Other    Anesthesia problems Neg Hx    Malignant hyperthermia Neg Hx    Social History   Socioeconomic History   Marital status: Married    Spouse name: Jenkins   Number of children: Not on file   Years of education: Not on file   Highest education level: Bachelor's degree (e.g., BA, AB, BS)  Occupational History   Occupation: Part Electronics engineer  Tobacco Use   Smoking status: Former   Smokeless tobacco: Former  Building services engineer status: Never Used  Substance and Sexual Activity   Alcohol use: Yes    Alcohol/week: 4.0 standard drinks of alcohol    Types: 4 Glasses of wine per week   Drug use: Yes    Frequency: 1.0 times per week    Types: Marijuana   Sexual activity: Yes    Partners: Female  Other Topics Concern   Not on file  Social History Narrative   Regular Exercise -  Started to walk early this year; pt has had pain in back and buttocks area. [place] daily      Lives with wife/2025   Social Drivers of Health   Financial Resource Strain: Low Risk  (09/15/2023)   Overall Financial Resource Strain (CARDIA)    Difficulty of Paying Living Expenses: Not hard at all  Food Insecurity: No Food Insecurity (09/15/2023)   Hunger Vital Sign    Worried About Running Out of Food in the Last Year: Never true    Ran Out of Food in the Last Year: Never true  Transportation Needs: No Transportation Needs (09/15/2023)   PRAPARE - Administrator, Civil Service (Medical): No    Lack of Transportation (Non-Medical): No  Physical Activity: Insufficiently Active (09/15/2023)   Exercise Vital Sign    Days of Exercise per Week: 2 days    Minutes of Exercise per Session: 40 min  Stress: No Stress Concern Present (09/15/2023)   [employer] of Occupational Health -  Occupational Stress Questionnaire    Feeling of Stress: Only a little  Social Connections: Moderately Isolated (09/15/2023)   Social Connection and Isolation Panel    Frequency of Communication with Friends and Family: More than three times a week    Frequency of Social Gatherings with Friends and Family: Never    Attends Religious Services: Never    Database administrator or Organizations: No    Attends Engineer, structural: Never    Marital Status: Married    Tobacco Counseling Counseling given: Not Answered    Clinical Intake:  Pre-visit preparation completed: Yes  Pain : 0-10 (Rt knee replacement) Pain Score: 4  (#8 for stiffness) Pain Location: Knee  Pain Orientation: Right Pain Radiating Towards: back of neck down into shoulders-per pt Pain Descriptors / Indicators: Aching, Discomfort, Sore Pain Onset: More than a month ago Pain Frequency: Constant Pain Relieving Factors: XARELTO , Goody powder and had taken Advil as of last night Effect of Pain on Daily Activities: walking  Pain Relieving Factors: XARELTO , Goody powder and had taken Advil as of last night  BMI - recorded: 38 Nutritional Status: BMI > 30  Obese Nutritional Risks: None, Other (Comment) (has had constapation) Diabetes: No  Lab Results  Component Value Date   HGBA1C 6.1 01/02/2021   HGBA1C 6.1 06/17/2010   HGBA1C 6.0 05/01/2008     How often do you need to have someone help you when you read instructions, pamphlets, or other written materials from your doctor or pharmacy?: 1 - Never  Interpreter Needed?: No  Information entered by :: Juanell Saffo, RMA   Activities of Daily Living     09/15/2023    2:44 PM  In your present state of health, do you have any difficulty performing the following activities:  Hearing? 0  Vision? 0  Difficulty concentrating or making decisions? 0  Walking or climbing stairs? 1  Comment due to knee surgery  Dressing or bathing? 0  Doing errands,  shopping? 0  Preparing Food and eating ? N  Using the Toilet? N  In the past six months, have you accidently leaked urine? Y  Do you have problems with loss of bowel control? N  Managing your Medications? N  Managing your Finances? N  Housekeeping or managing your Housekeeping? N    Patient Care Team: Plotnikov, Karlynn GAILS, MD as PCP - General  I have updated your Care Teams any recent Medical Services you may have received from other providers in the past year.     Assessment:   This is a routine wellness examination for Joseph [name].  Hearing/Vision screen Hearing Screening - Comments:: Denies hearing difficulties   Vision Screening - Comments:: Wears eyeglasses when driving/ Not at the moment   Goals Addressed   None    Depression Screen     09/15/2023    3:14 PM 07/15/2022    1:45 PM 05/13/2021    1:54 PM 04/09/2020   10:53 AM 08/02/2019    2:57 PM 05/19/2017    2:59 PM 02/19/2016    3:15 PM  PHQ 2/9 Scores  PHQ - 2 Score 0 0 0 0 0 0 0  PHQ- 9 Score 0  0        Fall Risk     09/15/2023    3:09 PM 07/15/2022    1:45 PM 05/13/2021    1:55 PM 04/09/2020   10:39 AM 12/14/2018    5:17 PM  Fall Risk   Falls in the past year? 0 0 1 0 0   Comment     Emmi Telephone Survey: data to providers prior to load   Number falls in past yr: 0 0 0 0   Injury with Fall? 0 0 0 0   Risk for fall due to :  No Fall Risks  No Fall Risks   Follow up Falls evaluation completed;Falls prevention discussed Falls evaluation completed  Falls evaluation completed       Data saved with a previous flowsheet row definition    MEDICARE RISK AT HOME:  Medicare Risk at Home Any stairs in or around the home?: Yes (2 story) If so, are there any without handrails?: No Home free of  loose throw rugs in walkways, pet beds, electrical cords, etc?: Yes Adequate lighting in your home to reduce risk of falls?: Yes Life alert?: No Use of a cane, walker or w/c?: Yes (cane) Grab bars in the bathroom?: Yes Shower  chair or bench in shower?: Yes Elevated toilet seat or a handicapped toilet?: Yes  TIMED UP AND GO:  Was the test performed?  No  Cognitive Function: Declined/Normal: No cognitive concerns noted by patient or family. Patient alert, oriented, able to answer questions appropriately and recall recent events. No signs of memory loss or confusion.        Immunizations Immunization History  Administered Date(s) Administered   Fluad Quad(high Dose 65+) 10/07/2021   INFLUENZA, HIGH DOSE SEASONAL PF 10/23/2015, 11/18/2016, 11/17/2017, 11/28/2020   Influenza Split 10/26/2011, 11/16/2022   Influenza Whole 10/12/2007, 10/30/2008, 11/12/2009   Influenza,inj,Quad PF,6+ Mos 12/07/2012, 09/20/2013, 10/03/2014   Moderna Covid-19 Vaccine Bivalent Booster 80yrs & up 11/25/2020   Moderna Sars-Covid-2 Vaccination 11/25/2020   PFIZER(Purple Top)SARS-COV-2 Vaccination 02/21/2019, 03/11/2019, 10/26/2019   Pneumococcal Conjugate-13 02/19/2016   Pneumococcal Polysaccharide-23 05/19/2017   Tdap 03/18/2011   Zoster, Live 09/06/2010    Screening Tests Health Maintenance  Topic Date Due   Zoster Vaccines- Shingrix  (1 of 2) 08/06/1969   DTaP/Tdap/Td (2 - Td or Tdap) 03/17/2021   COVID-19 Vaccine (5 - 2024-25 season) 09/20/2022   INFLUENZA VACCINE  08/20/2023   Medicare Annual Wellness (AWV)  09/14/2024   Colonoscopy  11/03/2032   Pneumococcal Vaccine: 50+ Years  Completed   Hepatitis C Screening  Completed   HPV VACCINES  Aged Out   Meningococcal B Vaccine  Aged Out    Health Maintenance  Health Maintenance Due  Topic Date Due   Zoster Vaccines- Shingrix  (1 of 2) 08/06/1969   DTaP/Tdap/Td (2 - Td or Tdap) 03/17/2021   COVID-19 Vaccine (5 - 2024-25 season) 09/20/2022   INFLUENZA VACCINE  08/20/2023   Health Maintenance Items Addressed: See Nurse Notes at the end of this note  Additional Screening:  Vision Screening: Recommended annual ophthalmology exams for early detection of glaucoma and  other disorders of the eye. Would you like a referral to an eye doctor? Yes    Dental Screening: Recommended annual dental exams for proper oral hygiene  Community Resource Referral / Chronic Care Management: CRR required this visit?  No   CCM required this visit?  No   Plan:    I have personally reviewed and noted the following in the patient's chart:   Medical and social history Use of alcohol, tobacco or illicit drugs  Current medications and supplements including opioid prescriptions. Patient is currently taking opioid prescriptions. Information provided to patient regarding non-opioid alternatives. Patient advised to discuss non-opioid treatment plan with their provider. Functional ability and status Nutritional status Physical activity Advanced directives List of other physicians Hospitalizations, surgeries, and ER visits in previous 12 months Vitals Screenings to include cognitive, depression, and falls Referrals and appointments  In addition, I have reviewed and discussed with patient certain preventive protocols, quality metrics, and best practice recommendations. A written personalized care plan for preventive services as well as general preventive health recommendations were provided to patient.   Marliyah Reid L Chance Munter, CMA   09/15/2023   After Visit Summary: (MyChart) Due to this being a telephonic visit, the after visit summary with patients personalized plan was offered to patient via MyChart   Notes: Patient is due for a Tdap vaccine.  He has some concerns about some recent neck  pain/stiffness.  Patient would like to discuss during his next office visit.  He had no other concerns to address today. Medical screening examination/treatment/procedure(s) were performed by non-physician practitioner and as supervising physician I was immediately available for consultation/collaboration.  I agree with above. Karlynn Noel, MD

## 2023-09-15 NOTE — Patient Instructions (Signed)
 Mr. Joseph [name] , Thank you for taking time out of your busy schedule to complete your Annual Wellness Visit with me. I enjoyed our conversation and look forward to speaking with you again next year. I, as well as your care team,  appreciate your ongoing commitment to your health goals. Please review the following plan we discussed and let me know if I can assist you in the future. Your Game plan/ To Do List     Follow up Visits: We will see or speak with you next year for your Next Medicare AWV with our clinical staff Have you seen your provider in the last 6 months (3 months if uncontrolled diabetes)? Yes.  Last office visit on 04/01/2023.  Clinician Recommendations:  Aim for 30 minutes of exercise or brisk walking, 6-8 glasses of water, and 5 servings of fruits and vegetables each day. You are due for a tetanus vaccine and can get that done at your pharmacy.      This is a list of the screenings recommended for you:  Health Maintenance  Topic Date Due   DTaP/Tdap/Td vaccine (2 - Td or Tdap) 03/17/2021   COVID-19 Vaccine (5 - 2024-25 season) 09/20/2022   Flu Shot  08/20/2023   Medicare Annual Wellness Visit  09/14/2024   Colon Cancer Screening  11/03/2032   Pneumococcal Vaccine for age over 45  Completed   Hepatitis C Screening  Completed   Zoster (Shingles) Vaccine  Completed   HPV Vaccine  Aged Out   Meningitis B Vaccine  Aged Out    Advanced directives: (Declined) Advance directive discussed with you today. Even though you declined this today, please call our office should you change your mind, and we can give you the proper paperwork for you to fill out. Advance Care Planning is important because it:  [x]  Makes sure you receive the medical care that is consistent with your values, goals, and preferences  [x]  It provides guidance to your family and loved ones and reduces their decisional burden about whether or not they are making the right decisions based on your wishes.  Follow the  link provided in your after visit summary or read over the paperwork we have mailed to you to help you started getting your Advance Directives in place. If you need assistance in completing these, please reach out to us  so that we can help you!  See attachments for Preventive Care and Fall Prevention Tips.

## 2023-09-22 DIAGNOSIS — R262 Difficulty in walking, not elsewhere classified: Secondary | ICD-10-CM | POA: Diagnosis not present

## 2023-09-22 DIAGNOSIS — Z96651 Presence of right artificial knee joint: Secondary | ICD-10-CM | POA: Diagnosis not present

## 2023-09-22 DIAGNOSIS — M170 Bilateral primary osteoarthritis of knee: Secondary | ICD-10-CM | POA: Diagnosis not present

## 2023-09-23 DIAGNOSIS — K080 Exfoliation of teeth due to systemic causes: Secondary | ICD-10-CM | POA: Diagnosis not present

## 2023-09-27 DIAGNOSIS — M170 Bilateral primary osteoarthritis of knee: Secondary | ICD-10-CM | POA: Diagnosis not present

## 2023-09-27 DIAGNOSIS — R262 Difficulty in walking, not elsewhere classified: Secondary | ICD-10-CM | POA: Diagnosis not present

## 2023-09-27 DIAGNOSIS — Z96651 Presence of right artificial knee joint: Secondary | ICD-10-CM | POA: Diagnosis not present

## 2023-10-04 DIAGNOSIS — M170 Bilateral primary osteoarthritis of knee: Secondary | ICD-10-CM | POA: Diagnosis not present

## 2023-10-04 DIAGNOSIS — Z96651 Presence of right artificial knee joint: Secondary | ICD-10-CM | POA: Diagnosis not present

## 2023-10-04 DIAGNOSIS — R262 Difficulty in walking, not elsewhere classified: Secondary | ICD-10-CM | POA: Diagnosis not present

## 2023-10-05 ENCOUNTER — Encounter: Payer: Self-pay | Admitting: Internal Medicine

## 2023-10-05 DIAGNOSIS — R2241 Localized swelling, mass and lump, right lower limb: Secondary | ICD-10-CM | POA: Diagnosis not present

## 2023-10-05 DIAGNOSIS — Z96651 Presence of right artificial knee joint: Secondary | ICD-10-CM | POA: Diagnosis not present

## 2023-10-05 DIAGNOSIS — L03115 Cellulitis of right lower limb: Secondary | ICD-10-CM | POA: Diagnosis not present

## 2023-10-06 DIAGNOSIS — M170 Bilateral primary osteoarthritis of knee: Secondary | ICD-10-CM | POA: Diagnosis not present

## 2023-10-06 DIAGNOSIS — Z96651 Presence of right artificial knee joint: Secondary | ICD-10-CM | POA: Diagnosis not present

## 2023-10-06 DIAGNOSIS — R262 Difficulty in walking, not elsewhere classified: Secondary | ICD-10-CM | POA: Diagnosis not present

## 2023-10-11 DIAGNOSIS — Z96651 Presence of right artificial knee joint: Secondary | ICD-10-CM | POA: Diagnosis not present

## 2023-10-11 DIAGNOSIS — R262 Difficulty in walking, not elsewhere classified: Secondary | ICD-10-CM | POA: Diagnosis not present

## 2023-10-11 DIAGNOSIS — M170 Bilateral primary osteoarthritis of knee: Secondary | ICD-10-CM | POA: Diagnosis not present

## 2023-10-12 DIAGNOSIS — L03115 Cellulitis of right lower limb: Secondary | ICD-10-CM | POA: Diagnosis not present

## 2023-10-12 DIAGNOSIS — Z96651 Presence of right artificial knee joint: Secondary | ICD-10-CM | POA: Diagnosis not present

## 2023-10-13 ENCOUNTER — Ambulatory Visit: Admitting: Internal Medicine

## 2023-10-14 ENCOUNTER — Encounter: Payer: Self-pay | Admitting: Internal Medicine

## 2023-10-14 ENCOUNTER — Ambulatory Visit: Admitting: Internal Medicine

## 2023-10-14 VITALS — BP 118/82 | HR 82 | Temp 98.8°F | Resp 16 | Ht 73.0 in | Wt 284.0 lb

## 2023-10-14 DIAGNOSIS — R739 Hyperglycemia, unspecified: Secondary | ICD-10-CM

## 2023-10-14 DIAGNOSIS — F419 Anxiety disorder, unspecified: Secondary | ICD-10-CM

## 2023-10-14 DIAGNOSIS — Z23 Encounter for immunization: Secondary | ICD-10-CM | POA: Diagnosis not present

## 2023-10-14 DIAGNOSIS — R600 Localized edema: Secondary | ICD-10-CM

## 2023-10-14 DIAGNOSIS — I82513 Chronic embolism and thrombosis of femoral vein, bilateral: Secondary | ICD-10-CM | POA: Diagnosis not present

## 2023-10-14 DIAGNOSIS — I82401 Acute embolism and thrombosis of unspecified deep veins of right lower extremity: Secondary | ICD-10-CM | POA: Diagnosis not present

## 2023-10-14 LAB — CBC WITH DIFFERENTIAL/PLATELET
Basophils Absolute: 0 K/uL (ref 0.0–0.1)
Basophils Relative: 0.5 % (ref 0.0–3.0)
Eosinophils Absolute: 0.3 K/uL (ref 0.0–0.7)
Eosinophils Relative: 5.9 % — ABNORMAL HIGH (ref 0.0–5.0)
HCT: 40.4 % (ref 39.0–52.0)
Hemoglobin: 13.6 g/dL (ref 13.0–17.0)
Lymphocytes Relative: 26.9 % (ref 12.0–46.0)
Lymphs Abs: 1.3 K/uL (ref 0.7–4.0)
MCHC: 33.6 g/dL (ref 30.0–36.0)
MCV: 95.7 fl (ref 78.0–100.0)
Monocytes Absolute: 0.7 K/uL (ref 0.1–1.0)
Monocytes Relative: 14.6 % — ABNORMAL HIGH (ref 3.0–12.0)
Neutro Abs: 2.6 K/uL (ref 1.4–7.7)
Neutrophils Relative %: 52.1 % (ref 43.0–77.0)
Platelets: 333 K/uL (ref 150.0–400.0)
RBC: 4.22 Mil/uL (ref 4.22–5.81)
RDW: 13 % (ref 11.5–15.5)
WBC: 4.9 K/uL (ref 4.0–10.5)

## 2023-10-14 LAB — COMPREHENSIVE METABOLIC PANEL WITH GFR
ALT: 13 U/L (ref 0–53)
AST: 20 U/L (ref 0–37)
Albumin: 4.2 g/dL (ref 3.5–5.2)
Alkaline Phosphatase: 79 U/L (ref 39–117)
BUN: 19 mg/dL (ref 6–23)
CO2: 24 meq/L (ref 19–32)
Calcium: 9.3 mg/dL (ref 8.4–10.5)
Chloride: 102 meq/L (ref 96–112)
Creatinine, Ser: 1.42 mg/dL (ref 0.40–1.50)
GFR: 49.13 mL/min — ABNORMAL LOW (ref >60.00)
Glucose, Bld: 116 mg/dL — ABNORMAL HIGH (ref 70–99)
Potassium: 4.2 meq/L (ref 3.5–5.1)
Sodium: 135 meq/L (ref 135–145)
Total Bilirubin: 0.4 mg/dL (ref 0.2–1.2)
Total Protein: 7.1 g/dL (ref 6.0–8.3)

## 2023-10-14 LAB — TSH: TSH: 0.92 u[IU]/mL (ref 0.35–5.50)

## 2023-10-14 MED ORDER — ALPRAZOLAM 1 MG PO TABS: ORAL_TABLET | ORAL | 1 refills | Status: AC

## 2023-10-14 MED ORDER — TRIAMCINOLONE ACETONIDE 0.1 % EX CREA: 1.0000 | TOPICAL_CREAM | Freq: Four times a day (QID) | CUTANEOUS | 2 refills | Status: DC

## 2023-10-14 MED ORDER — SILDENAFIL CITRATE 100 MG PO TABS: 100.0000 mg | ORAL_TABLET | ORAL | 1 refills | Status: AC | PRN

## 2023-10-14 MED ORDER — FUROSEMIDE 20 MG PO TABS: 20.0000 mg | ORAL_TABLET | Freq: Every day | ORAL | 1 refills | Status: AC

## 2023-10-14 MED ORDER — TRIAMCINOLONE ACETONIDE 0.1 % EX CREA: 1.0000 | TOPICAL_CREAM | Freq: Four times a day (QID) | CUTANEOUS | 2 refills | Status: AC

## 2023-10-14 NOTE — Patient Instructions (Addendum)
 Boiron Arnicare Cream for Joint Pain, Muscle Pain, Swelling, Soreness, Stiffness, and Bruises - Fast Absorbing and Fragrance-Free - 4.2 oz  Compression socks 4-6 pack - wide calves    Furosemide  prn 20-40 mg prn  Elevate leg  Us  Triamc cream on R leg  USEFUL THINGS FOR ARTHRITIS and musculoskeletal pains:    A rice sock heating pad refers to a homemade heating pad created by filling a sock with uncooked rice, which can be heated in a microwave to provide a warm compress for sore muscles, pain relief, or other applications; essentially, it's a simple way to generate heat using readily available materials.  Key points about rice sock heat: How to make it: Fill a clean sock (preferably a tube sock) about 2/3 full with uncooked rice, tie a knot at the top to secure the rice inside.  Heating it up: Place the rice sock in the microwave and heat in short intervals (usually around 30 seconds at a time) until it reaches the desired warmth.  Important considerations: Check temperature before applying: Always test the temperature of the rice sock before applying it to your skin to avoid burns.  Use a towel to protect skin: Wrap the rice sock in a thin towel to distribute the heat evenly and protect your skin.  Uses: Muscle aches and pains  Menstrual cramps  Neck pain  Arthritis discomfort

## 2023-10-14 NOTE — Assessment & Plan Note (Addendum)
 Using Loose it app for wt loss We can consider a compression lymphedema pump to use at home. I think, we will have a better chance to have it covered by your insurance if ordered via a vascular surgery office.  Compression socks Furosemide  prn 20-40 mg prn Elevate leg Triamc cream on R leg

## 2023-10-14 NOTE — Assessment & Plan Note (Signed)
 Using Loose it app for wt loss We can consider a compression lymphedema pump to use at home. I think, we will have a better chance to have it covered by your insurance if ordered via a vascular surgery office.  Compression socks Furosemide  prn 20-40 mg prn Elevate leg Triamc cream on R leg

## 2023-10-14 NOTE — Progress Notes (Signed)
 Subjective:  Patient ID: Joseph [name], male    DOB: [birth year]  Age: 73 y.o. 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 (I can't get my foot into my regular shoes without modification). About three days ago, I began to develop cellulitis on the lower right leg. I saw my surgeon today and he ordered an ultrasound. As I understand the results, an old DVT scar tissue area around my knee is still there and is causing the swelling problem and the blistering/weeping from cellulitis. This old scar tissue was from a clot in 2005 that I never knew I had until I fractured my ankle around 2007 and developed an ankle-to-knee clot that you had treated with a new radiology procedure. That procedure revealed the old clot's scar tissue but they couldn't do anything about it. I have an appointment scheduled with you on Oct. 14 but the surgeon's office suggested I see you sooner than later so I will call tomorrow about sooner appointments. [name] I will send another message tomorrow if I can move the appointment up. The surgeon's office said today's Ultrasound results have been sent to you.     Outpatient Medications Prior to Visit  Medication Sig Dispense Refill   Cholecalciferol (VITAMIN D3) 50 MCG (2000 UT) capsule Take 1 capsule (2,000 Units total) by mouth daily. 100 capsule 3   famotidine  (PEPCID ) 40 MG tablet TAKE ONE TABLET BY MOUTH DAILY 90 tablet 3   levothyroxine  (SYNTHROID ) 25 MCG tablet TAKE 1 TABLET BY MOUTH DAILY 90 tablet 3   methylPREDNISolone  (MEDROL  DOSEPAK) 4 MG TBPK tablet As directed 21 tablet 0   sulfamethoxazole-trimethoprim (BACTRIM DS) 800-160 MG tablet  Take 1 tablet by mouth 2 (two) times daily.     XARELTO  20 MG TABS tablet TAKE 1 TABLET BY MOUTH DAILY 90 tablet 3   ALPRAZolam  (XANAX ) 1 MG tablet TAKE ONE HALF TO ONE TABLET BY MOUTH TWO TIMES A DAY AS NEEDED FOR ANXIETY 180 tablet 1   sildenafil  (VIAGRA ) 100 MG tablet Take 1 tablet (100 mg total) by mouth as needed for erectile dysfunction. 100 tablet 1   acetaminophen -codeine  (TYLENOL  #3) 300-30 MG tablet Take 1 tablet by mouth every 6 (six) hours as needed for moderate pain (pain score 4-6). (Patient not taking: Reported on 10/14/2023) 20 tablet 1   benzonatate  (TESSALON ) 100 MG capsule Take 1 capsule (100 mg total) by mouth 3 (three) times daily as needed. (Patient not taking: Reported on 10/14/2023) 30 capsule 0   cyclobenzaprine  (FLEXERIL ) 5 MG tablet Take 1 tablet (5 mg total) by mouth 3 (three) times daily as needed for muscle spasms. (Patient not taking: Reported on 10/14/2023) 90 tablet 1   ipratropium (ATROVENT ) 0.03 % nasal spray Place 2 sprays into both nostrils every 12 (twelve) hours. (Patient not taking: Reported on 10/14/2023) 30 mL 12   No facility-administered medications prior to visit.    ROS: Review of Systems  Constitutional:  Negative for appetite change, fatigue and unexpected weight change.  HENT:  Negative for congestion, nosebleeds, sneezing, sore throat and trouble swallowing.   Eyes:  Negative for itching and visual disturbance.  Respiratory:  Negative for cough.   Cardiovascular:  Negative for chest pain, palpitations and leg swelling.  Gastrointestinal:  Negative for abdominal distention, blood in stool, diarrhea and nausea.  Genitourinary:  Negative for frequency and hematuria.  Musculoskeletal:  Positive for arthralgias and gait problem. Negative for back pain, joint swelling and neck pain.  Skin:  Negative for rash.  Neurological:  Negative for dizziness, tremors, speech difficulty and weakness.  Psychiatric/Behavioral:  Negative for agitation, dysphoric mood  and sleep disturbance. The patient is not nervous/anxious.     Objective:  BP 118/82 (BP Location: Left Arm, Patient Position: Sitting)   Pulse 82   Temp 98.8 F (37.1 C) (Oral)   Resp 16   Ht 6' 1 (1.854 m)   Wt 284 lb (128.8 kg)   SpO2 97%   BMI 37.47 kg/m   BP Readings from Last 3 Encounters:  10/14/23 118/82  04/01/23 120/82  07/15/22 120/78    Wt Readings from Last 3 Encounters:  10/14/23 284 lb (128.8 kg)  09/15/23 288 lb (130.6 kg)  04/01/23 288 lb (130.6 kg)    Physical Exam Constitutional:      General: He is not in acute distress.    Appearance: He is well-developed.     Comments: NAD  Eyes:     Conjunctiva/sclera: Conjunctivae normal.     Pupils: Pupils are equal, round, and reactive to light.  Neck:     Thyroid : No thyromegaly.     Vascular: No JVD.  Cardiovascular:     Rate and Rhythm: Normal rate and regular rhythm.     Heart sounds: Normal heart sounds. No murmur heard.    No friction rub. No gallop.  Pulmonary:     Effort: Pulmonary effort is normal. No respiratory distress.     Breath sounds: Normal breath sounds. No wheezing or rales.  Chest:     Chest wall: No tenderness.  Abdominal:     General: Bowel sounds are normal. There is no distension.     Palpations: Abdomen is soft. There is no mass.     Tenderness: There is no abdominal tenderness. There is no guarding or rebound.  Musculoskeletal:        General: No tenderness. Normal range of motion.     Cervical back: Normal range of motion.  Lymphadenopathy:     Cervical: No cervical adenopathy.  Skin:    General: Skin is warm and dry.     Findings: No rash.  Neurological:     Mental Status: He is alert and oriented to person, place, and time.     Cranial Nerves: No cranial nerve deficit.     Motor: No abnormal muscle tone.     Coordination: Coordination normal.     Gait: Gait normal.     Deep Tendon Reflexes: Reflexes are normal and symmetric.  Psychiatric:        Behavior:  Behavior normal.        Thought Content: Thought content normal.        Judgment: Judgment normal.     Lab Results  Component Value Date   WBC 4.9 10/14/2023   HGB 13.6 10/14/2023   HCT 40.4 10/14/2023   PLT 333.0 10/14/2023   GLUCOSE 116 (H) 10/14/2023   CHOL 128 04/01/2023   TRIG 116.0 04/01/2023   HDL 46.40 04/01/2023   LDLCALC 59 04/01/2023   ALT 13 10/14/2023   AST 20 10/14/2023   NA 135 10/14/2023   K 4.2 10/14/2023   CL  102 10/14/2023   CREATININE 1.42 10/14/2023   BUN 19 10/14/2023   CO2 24 10/14/2023   TSH 0.92 10/14/2023   PSA 0.70 04/01/2023   INR 1.8 (H) 05/13/2021   HGBA1C 6.1 01/02/2021    No results found.  Assessment & Plan:   Problem List Items Addressed This Visit     Acute embolism and thrombosis of unspecified deep veins of unspecified lower extremity (HCC)   Using Loose it app for wt loss We can consider a compression lymphedema pump to use at home. I think, we will have a better chance to have it covered by your insurance if ordered via a vascular surgery office.  Compression socks Furosemide  prn 20-40 mg prn Elevate leg Triamc cream on R leg      Relevant Medications   furosemide  (LASIX ) 20 MG tablet   sildenafil  (VIAGRA ) 100 MG tablet   Anxiety disorder   Chronic Cont on Xanax  1 mg twice daily prn  Potential benefits of a long term benzodiazepines  use as well as potential risks  and complications were explained to the patient and were aknowledged.      Relevant Medications   ALPRAZolam  (XANAX ) 1 MG tablet   Deep vein thrombosis (DVT) (HCC) - Primary   Using Loose it app for wt loss We can consider a compression lymphedema pump to use at home. I think, we will have a better chance to have it covered by your insurance if ordered via a vascular surgery office.  Compression socks Furosemide  prn 20-40 mg prn Elevate leg Triamc cream on R leg      Relevant Medications   furosemide  (LASIX ) 20 MG tablet   sildenafil  (VIAGRA ) 100  MG tablet   Other Relevant Orders   Comprehensive metabolic panel with GFR (Completed)   CBC with Differential/Platelet (Completed)   TSH (Completed)   Edema   Chronic  RLE - post-DVT Cont w/a compression sock Using diuretics as needed      Relevant Orders   Comprehensive metabolic panel with GFR (Completed)   CBC with Differential/Platelet (Completed)   TSH (Completed)   Hyperglycemia   Prediabetes.  Continue weight loss with the help of the app Loose it Obtain lab work including hemoglobin A1c, blood chemistry         Meds ordered this encounter  Medications   furosemide  (LASIX ) 20 MG tablet    Sig: Take 1-2 tablets (20-40 mg total) by mouth daily.    Dispense:  60 tablet    Refill:  1   DISCONTD: triamcinolone  cream (KENALOG ) 0.1 %    Sig: Apply 1 Application topically 4 (four) times daily.    Dispense:  160 g    Refill:  2   sildenafil  (VIAGRA ) 100 MG tablet    Sig: Take 1 tablet (100 mg total) by mouth as needed for erectile dysfunction.    Dispense:  100 tablet    Refill:  1   triamcinolone  cream (KENALOG ) 0.1 %    Sig: Apply 1 Application topically 4 (four) times daily.    Dispense:  160 g    Refill:  2   ALPRAZolam  (XANAX ) 1 MG tablet    Sig: TAKE ONE HALF TO ONE TABLET BY MOUTH TWO TIMES A DAY AS NEEDED FOR ANXIETY    Dispense:  180 tablet    Refill:  1      Follow-up: Return in about 3 months (around 01/13/2024) for a follow-up visit.  Marolyn Noel, MD

## 2023-10-15 ENCOUNTER — Ambulatory Visit: Payer: Self-pay | Admitting: Internal Medicine

## 2023-10-18 DIAGNOSIS — R262 Difficulty in walking, not elsewhere classified: Secondary | ICD-10-CM | POA: Diagnosis not present

## 2023-10-18 DIAGNOSIS — Z96651 Presence of right artificial knee joint: Secondary | ICD-10-CM | POA: Diagnosis not present

## 2023-10-18 DIAGNOSIS — M170 Bilateral primary osteoarthritis of knee: Secondary | ICD-10-CM | POA: Diagnosis not present

## 2023-10-24 NOTE — Assessment & Plan Note (Signed)
 Chronic  RLE - post-DVT Cont w/a compression sock Using diuretics as needed

## 2023-10-24 NOTE — Assessment & Plan Note (Signed)
 Prediabetes.  Continue weight loss with the help of the app Loose it Obtain lab work including hemoglobin A1c, blood chemistry

## 2023-10-24 NOTE — Assessment & Plan Note (Signed)
Chronic Cont on Xanax 1 mg twice daily prn  Potential benefits of a long term benzodiazepines  use as well as potential risks  and complications were explained to the patient and were aknowledged. 

## 2023-11-02 ENCOUNTER — Ambulatory Visit: Admitting: Internal Medicine

## 2023-11-03 DIAGNOSIS — Z471 Aftercare following joint replacement surgery: Secondary | ICD-10-CM | POA: Diagnosis not present

## 2023-11-03 DIAGNOSIS — Z96651 Presence of right artificial knee joint: Secondary | ICD-10-CM | POA: Diagnosis not present

## 2023-11-03 DIAGNOSIS — R262 Difficulty in walking, not elsewhere classified: Secondary | ICD-10-CM | POA: Diagnosis not present

## 2023-11-03 DIAGNOSIS — M170 Bilateral primary osteoarthritis of knee: Secondary | ICD-10-CM | POA: Diagnosis not present

## 2023-11-03 DIAGNOSIS — M25561 Pain in right knee: Secondary | ICD-10-CM | POA: Diagnosis not present

## 2023-11-22 DIAGNOSIS — R262 Difficulty in walking, not elsewhere classified: Secondary | ICD-10-CM | POA: Diagnosis not present

## 2023-11-22 DIAGNOSIS — Z471 Aftercare following joint replacement surgery: Secondary | ICD-10-CM | POA: Diagnosis not present

## 2023-11-22 DIAGNOSIS — M25561 Pain in right knee: Secondary | ICD-10-CM | POA: Diagnosis not present

## 2023-11-22 DIAGNOSIS — Z96651 Presence of right artificial knee joint: Secondary | ICD-10-CM | POA: Diagnosis not present

## 2023-11-22 DIAGNOSIS — M170 Bilateral primary osteoarthritis of knee: Secondary | ICD-10-CM | POA: Diagnosis not present

## 2023-12-30 ENCOUNTER — Other Ambulatory Visit: Payer: Self-pay | Admitting: Internal Medicine

## 2024-01-11 DIAGNOSIS — H524 Presbyopia: Secondary | ICD-10-CM | POA: Diagnosis not present

## 2024-09-15 ENCOUNTER — Ambulatory Visit
